# Patient Record
Sex: Female | Born: 1935 | Race: Black or African American | Hispanic: No | Smoking: Never smoker
Health system: Southern US, Community
[De-identification: ages and names within clinical notes are randomized; demographics above are authoritative.]

## PROBLEM LIST (undated history)

## (undated) DIAGNOSIS — I10 Essential (primary) hypertension: Secondary | ICD-10-CM

## (undated) DIAGNOSIS — E119 Type 2 diabetes mellitus without complications: Secondary | ICD-10-CM

## (undated) DIAGNOSIS — F0390 Unspecified dementia without behavioral disturbance: Secondary | ICD-10-CM

## (undated) DIAGNOSIS — E079 Disorder of thyroid, unspecified: Secondary | ICD-10-CM

## (undated) DIAGNOSIS — N189 Chronic kidney disease, unspecified: Secondary | ICD-10-CM

## (undated) DIAGNOSIS — E039 Hypothyroidism, unspecified: Secondary | ICD-10-CM

## (undated) HISTORY — PX: FRACTURE SURGERY: SHX138

## (undated) HISTORY — PX: ABDOMINAL HYSTERECTOMY: SHX81

---

## 2015-05-25 ENCOUNTER — Emergency Department (HOSPITAL_COMMUNITY)
Admission: EM | Admit: 2015-05-25 | Discharge: 2015-05-25 | Disposition: A | Discharge status: Home / Self Care | Payer: Medicare Other | Attending: Emergency Medicine | Admitting: Emergency Medicine

## 2015-05-25 ENCOUNTER — Emergency Department (HOSPITAL_COMMUNITY): Payer: Medicare Other

## 2015-05-25 ENCOUNTER — Encounter (HOSPITAL_COMMUNITY): Payer: Self-pay | Admitting: Emergency Medicine

## 2015-05-25 DIAGNOSIS — N390 Urinary tract infection, site not specified: Secondary | ICD-10-CM | POA: Diagnosis not present

## 2015-05-25 DIAGNOSIS — R0602 Shortness of breath: Secondary | ICD-10-CM | POA: Insufficient documentation

## 2015-05-25 DIAGNOSIS — I10 Essential (primary) hypertension: Secondary | ICD-10-CM | POA: Diagnosis not present

## 2015-05-25 DIAGNOSIS — R079 Chest pain, unspecified: Secondary | ICD-10-CM | POA: Diagnosis present

## 2015-05-25 DIAGNOSIS — E079 Disorder of thyroid, unspecified: Secondary | ICD-10-CM | POA: Diagnosis not present

## 2015-05-25 DIAGNOSIS — Z7984 Long term (current) use of oral hypoglycemic drugs: Secondary | ICD-10-CM | POA: Insufficient documentation

## 2015-05-25 DIAGNOSIS — Z79899 Other long term (current) drug therapy: Secondary | ICD-10-CM | POA: Insufficient documentation

## 2015-05-25 DIAGNOSIS — Z7901 Long term (current) use of anticoagulants: Secondary | ICD-10-CM | POA: Insufficient documentation

## 2015-05-25 DIAGNOSIS — E119 Type 2 diabetes mellitus without complications: Secondary | ICD-10-CM | POA: Diagnosis not present

## 2015-05-25 DIAGNOSIS — F0390 Unspecified dementia without behavioral disturbance: Secondary | ICD-10-CM | POA: Diagnosis not present

## 2015-05-25 HISTORY — DX: Disorder of thyroid, unspecified: E07.9

## 2015-05-25 HISTORY — DX: Essential (primary) hypertension: I10

## 2015-05-25 HISTORY — DX: Type 2 diabetes mellitus without complications: E11.9

## 2015-05-25 LAB — CBC
HCT: 36.7 % (ref 36.0–46.0)
Hemoglobin: 11.5 g/dL — ABNORMAL LOW (ref 12.0–15.0)
MCH: 28.3 pg (ref 26.0–34.0)
MCHC: 31.3 g/dL (ref 30.0–36.0)
MCV: 90.4 fL (ref 78.0–100.0)
PLATELETS: 223 10*3/uL (ref 150–400)
RBC: 4.06 MIL/uL (ref 3.87–5.11)
RDW: 14.8 % (ref 11.5–15.5)
WBC: 5.3 10*3/uL (ref 4.0–10.5)

## 2015-05-25 LAB — URINALYSIS, ROUTINE W REFLEX MICROSCOPIC
BILIRUBIN URINE: NEGATIVE
GLUCOSE, UA: NEGATIVE mg/dL
HGB URINE DIPSTICK: NEGATIVE
KETONES UR: NEGATIVE mg/dL
Nitrite: POSITIVE — AB
PH: 6.5 (ref 5.0–8.0)
Protein, ur: NEGATIVE mg/dL
Specific Gravity, Urine: 1.018 (ref 1.005–1.030)

## 2015-05-25 LAB — URINE MICROSCOPIC-ADD ON
RBC / HPF: NONE SEEN RBC/hpf (ref 0–5)
Squamous Epithelial / LPF: NONE SEEN

## 2015-05-25 LAB — BASIC METABOLIC PANEL
Anion gap: 11 (ref 5–15)
BUN: 27 mg/dL — ABNORMAL HIGH (ref 6–20)
CALCIUM: 9.2 mg/dL (ref 8.9–10.3)
CO2: 22 mmol/L (ref 22–32)
CREATININE: 1.31 mg/dL — AB (ref 0.44–1.00)
Chloride: 114 mmol/L — ABNORMAL HIGH (ref 101–111)
GFR calc Af Amer: 41 mL/min — ABNORMAL LOW (ref >60)
GFR, EST NON AFRICAN AMERICAN: 36 mL/min — AB (ref >60)
GLUCOSE: 157 mg/dL — AB (ref 65–99)
Potassium: 3.7 mmol/L (ref 3.5–5.1)
Sodium: 147 mmol/L — ABNORMAL HIGH (ref 135–145)

## 2015-05-25 LAB — I-STAT TROPONIN, ED: TROPONIN I, POC: 0.03 ng/mL (ref 0.00–0.08)

## 2015-05-25 MED ORDER — CEPHALEXIN 500 MG PO CAPS: 500.0000 mg | ORAL_CAPSULE | Freq: Three times a day (TID) | ORAL | Status: DC

## 2015-05-25 MED ORDER — STERILE WATER FOR INJECTION IJ SOLN
INTRAMUSCULAR | Status: AC
  Administered 2015-05-25: 2.1 mL
  Filled 2015-05-25: qty 10

## 2015-05-25 MED ORDER — CEFTRIAXONE SODIUM 1 G IJ SOLR
1.0000 g | Freq: Once | INTRAMUSCULAR | Status: AC
Start: 2015-05-25 — End: 2015-05-25
  Administered 2015-05-25: 1 g via INTRAMUSCULAR
  Filled 2015-05-25: qty 10

## 2015-05-25 NOTE — ED Notes (Signed)
Clydie BraunKaren, memory care manager accepts report for PT at this time. She is made aware of antibiotic dose in ED and new prescription

## 2015-05-25 NOTE — ED Provider Notes (Signed)
CSN: [id number]     Arrival date & time 05/25/15  1042 History   First MD Initiated Contact with Patient 05/25/15 1111     Chief Complaint  Patient presents with  . Chest Pain  . Shortness of Breath     (Consider location/radiation/quality/duration/timing/severity/associated sxs/prior Treatment) HPI  This is an 82 year old female with dementia who presents with chief complaint of "I just feel sick." Is a level V caveat due to dementia. The patient is unable to provide a good history. She states "I've just been feeling real bad for a long time." Unable to review systems. Patient denies chest pain or shortness of breath at this time. The patient denies any cough.  Past Medical History  Diagnosis Date  . Diabetes mellitus without complication (HCC)   . Hypertension   . Thyroid disease    Past Surgical History  Procedure Laterality Date  . Abdominal hysterectomy    . Fracture surgery     No family history on file. Social History  Substance Use Topics  . Smoking status: Never Smoker   . Smokeless tobacco: None  . Alcohol Use: No   OB History    No data available     Review of Systems  Unable to review systems  Allergies  Review of patient's allergies indicates no known allergies.  Home Medications   Prior to Admission medications   Medication Sig Start Date End Date Taking? Authorizing Provider  amLODipine (NORVASC) 10 MG tablet Take 10 mg by mouth daily. 05/13/15  Yes Historical Provider, MD  atorvastatin (LIPITOR) 20 MG tablet Take 20 mg by mouth daily. 04/26/15  Yes Historical Provider, MD  glipiZIDE (GLUCOTROL XL) 2.5 MG 24 hr tablet Take 2.5 mg by mouth daily. 04/18/15  Yes Historical Provider, MD  hydrochlorothiazide (HYDRODIURIL) 25 MG tablet Take 25 mg by mouth daily. 05/13/15  Yes Historical Provider, MD  levothyroxine (SYNTHROID, LEVOTHROID) 112 MCG tablet Take 112 mcg by mouth every other day. Alternate every other day with 125 mcg   Yes Historical Provider, MD   levothyroxine (SYNTHROID, LEVOTHROID) 125 MCG tablet Take 125 mcg by mouth every other day. Alternate every other day with 112 mcg   Yes Historical Provider, MD  lisinopril (PRINIVIL,ZESTRIL) 10 MG tablet Take 10 mg by mouth daily. 04/26/15  Yes Historical Provider, MD  LORazepam (ATIVAN) 0.5 MG tablet Take 0.5 mg by mouth 3 (three) times daily as needed. For anxiety sx 05/01/15  Yes Historical Provider, MD  LORazepam (ATIVAN) 0.5 MG tablet Take 0.5-1 mg by mouth 2 (two) times daily. Take 1 tab (0.5 mg) at 8 am and take 2 tabs (1 mg) at 8 pm.   Yes Historical Provider, MD  NAMZARIC 28-10 MG CP24 Take 1 capsule by mouth daily. 04/30/15  Yes Historical Provider, MD  omeprazole (PRILOSEC) 20 MG capsule Take 20 mg by mouth daily. 04/30/15  Yes Historical Provider, MD  QUEtiapine (SEROQUEL) 25 MG tablet Take 25 mg by mouth 2 (two) times daily. Take with 50 mg tab for 75 mg dose   Yes Historical Provider, MD  QUEtiapine (SEROQUEL) 50 MG tablet Take 50 mg by mouth 2 (two) times daily. Take with 25 mg tablet for 75 mg dose   Yes Historical Provider, MD  sertraline (ZOLOFT) 100 MG tablet Take 100 mg by mouth daily.   Yes Historical Provider, MD  traMADol (ULTRAM) 50 MG tablet Take 50 mg by mouth 3 (three) times daily as needed. 04/13/15  Yes Historical Provider, MD  vitamin B-12 (  CYANOCOBALAMIN) 1000 MCG tablet Take 1,000 mcg by mouth daily.   Yes Historical Provider, MD  Vitamins A & D (VITAMIN A & D) ointment Apply 1 application topically daily. Apply to sacral area every day   Yes Historical Provider, MD  XARELTO 20 MG TABS tablet Take 20 mg by mouth daily. 04/26/15  Yes Historical Provider, MD   BP 162/81 mmHg  Pulse 64  Temp(Src) 97.8 F (36.6 C) (Oral)  Resp 21  SpO2 98% Physical Exam  Constitutional: She is oriented to person, place, and time. She appears well-developed and well-nourished. No distress.  HENT:  Head: Normocephalic and atraumatic.  Eyes: Conjunctivae are normal. No scleral icterus.   Neck: Normal range of motion.  Cardiovascular: Normal rate, regular rhythm and normal heart sounds.  Exam reveals no gallop and no friction rub.   No murmur heard. Pulmonary/Chest: Effort normal and breath sounds normal. No respiratory distress.  Abdominal: Soft. Bowel sounds are normal. She exhibits no distension and no mass. There is no tenderness. There is no guarding.  Neurological: She is alert and oriented to person, place, and time.  Skin: Skin is warm and dry. She is not diaphoretic.    ED Course  Procedures (including critical care time) Labs Review Labs Reviewed  BASIC METABOLIC PANEL - Abnormal; Notable for the following:    Sodium 147 (*)    Chloride 114 (*)    Glucose, Bld 157 (*)    BUN 27 (*)    Creatinine, Ser 1.31 (*)    GFR calc non Af Amer 36 (*)    GFR calc Af Amer 41 (*)    All other components within normal limits  CBC - Abnormal; Notable for the following:    Hemoglobin 11.5 (*)    All other components within normal limits  I-STAT TROPOININ, ED    Imaging Review No results found. I have personally reviewed and evaluated these images and lab results as part of my medical decision-making.   EKG Interpretation   Date/Time:  Thursday May 25 2015 10:49:49 EDT Ventricular Rate:  63 PR Interval:  166 QRS Duration: 90 QT Interval:  424 QTC Calculation: 434 R Axis:   3 Text Interpretation:  Sinus rhythm LVH by voltage agree. no STEMI.no acute  ischemic appearance Confirmed by Donnald Garre, MD, Lebron Conners [phone]) on 05/25/2015  12:37:55 PM      MDM   Final diagnoses:  None    1:44 PM BP 170/77 mmHg  Pulse 63  Temp(Src) 97.8 F (36.6 C) (Oral)  Resp 19  SpO2 100% Patient with UA pos for UTI. Will treat wit IV rocephin.   Patient with UTI. Will treat with PO keflex at d/c  Afebrile, HDS. Appears safe for discharge.  Arthor Captain, PA-C 05/25/15 1629  Arby Barrette, MD 05/26/15 [phone]

## 2015-05-25 NOTE — ED Notes (Signed)
Called [facility] for pickup and transportation to [facility].

## 2015-05-25 NOTE — Discharge Instructions (Signed)

## 2015-05-25 NOTE — ED Notes (Signed)
[facility] comes [facility]. Patient has hx of ALZHEIMER . Per EMS patient complained of Chest Pain yesterday. Denies any on arrvial states she was also SOB. Patient breathing unlabored. Lungs sounds clear bilateral. Patient regular rate and rhythm. Patient does not appear to be in any distress during assessment, Patient 100% on RA

## 2015-05-25 NOTE — ED Notes (Signed)
Pt transported to xray 

## 2015-05-25 NOTE — ED Notes (Signed)
PT denies chest pain, SOB, and nausea. PT states, "I just feel bad. I'm sick" When asked to elaborate how she's feeling, PT repeats, "I just feel sick. I'm not hurting anywhere. I just don't feel good."

## 2015-05-25 NOTE — ED Notes (Signed)
Gave pt apple juice, per Claris Gowerharlotte - RN. Assisted pt with eating and drinking.

## 2015-05-30 ENCOUNTER — Emergency Department (HOSPITAL_COMMUNITY)
Admission: EM | Admit: 2015-05-30 | Discharge: 2015-05-30 | Disposition: A | Discharge status: Home / Self Care | Payer: Medicare Other | Attending: Emergency Medicine | Admitting: Emergency Medicine

## 2015-05-30 ENCOUNTER — Emergency Department (HOSPITAL_COMMUNITY): Payer: Medicare Other

## 2015-05-30 ENCOUNTER — Encounter (HOSPITAL_COMMUNITY): Payer: Self-pay

## 2015-05-30 DIAGNOSIS — W1839XA Other fall on same level, initial encounter: Secondary | ICD-10-CM | POA: Insufficient documentation

## 2015-05-30 DIAGNOSIS — S3992XA Unspecified injury of lower back, initial encounter: Secondary | ICD-10-CM | POA: Diagnosis present

## 2015-05-30 DIAGNOSIS — Z79899 Other long term (current) drug therapy: Secondary | ICD-10-CM | POA: Insufficient documentation

## 2015-05-30 DIAGNOSIS — E079 Disorder of thyroid, unspecified: Secondary | ICD-10-CM | POA: Diagnosis not present

## 2015-05-30 DIAGNOSIS — Y9389 Activity, other specified: Secondary | ICD-10-CM | POA: Diagnosis not present

## 2015-05-30 DIAGNOSIS — F0390 Unspecified dementia without behavioral disturbance: Secondary | ICD-10-CM | POA: Insufficient documentation

## 2015-05-30 DIAGNOSIS — W19XXXA Unspecified fall, initial encounter: Secondary | ICD-10-CM

## 2015-05-30 DIAGNOSIS — Y9289 Other specified places as the place of occurrence of the external cause: Secondary | ICD-10-CM | POA: Diagnosis not present

## 2015-05-30 DIAGNOSIS — E119 Type 2 diabetes mellitus without complications: Secondary | ICD-10-CM | POA: Diagnosis not present

## 2015-05-30 DIAGNOSIS — Y998 Other external cause status: Secondary | ICD-10-CM | POA: Insufficient documentation

## 2015-05-30 DIAGNOSIS — M549 Dorsalgia, unspecified: Secondary | ICD-10-CM

## 2015-05-30 DIAGNOSIS — Z7984 Long term (current) use of oral hypoglycemic drugs: Secondary | ICD-10-CM | POA: Diagnosis not present

## 2015-05-30 DIAGNOSIS — I10 Essential (primary) hypertension: Secondary | ICD-10-CM | POA: Diagnosis not present

## 2015-05-30 NOTE — ED Notes (Signed)
Pt agreeable to discharge, pt has dementia and [facility] has been called for transportation to [facility], report given to [facility]

## 2015-05-30 NOTE — ED Notes (Signed)
Called pt's dtr to tell her that pt is being discharged back [facility]. Called [facility] to give report pt is returning. [facility] called for transportation.

## 2015-05-30 NOTE — ED Provider Notes (Signed)
CSN: [id number]     Arrival date & time 05/30/15  4098 History   First MD Initiated Contact with Patient 05/30/15 [phone]     Chief Complaint  Patient presents with  . Fall     (Consider location/radiation/quality/duration/timing/severity/associated sxs/prior Treatment) HPI Comments: 82 year old female with past medical history including dementia, diabetes mellitus, hypertension who presents after a fall. History obtained from EMS. On EMS arrival to the patient's facility, she was laying on the floor and denied hitting her head but did not remember falling. According to nursing home staff, she is at her neurologic baseline. She was complaining of low back pain but currently denies any pain. She denies any complaints.  LEVEL 5 CAVEAT 2/2 DEMENTIA  Patient is a 82 y.o. female presenting with fall. The history is provided by the EMS personnel.  Fall    Past Medical History  Diagnosis Date  . Diabetes mellitus without complication (HCC)   . Hypertension   . Thyroid disease    Past Surgical History  Procedure Laterality Date  . Abdominal hysterectomy    . Fracture surgery     History reviewed. No pertinent family history. Social History  Substance Use Topics  . Smoking status: Never Smoker   . Smokeless tobacco: None  . Alcohol Use: No   OB History    No data available     Review of Systems  Unable to perform ROS: Dementia      Allergies  Review of patient's allergies indicates no known allergies.  Home Medications   Prior to Admission medications   Medication Sig Start Date End Date Taking? Authorizing Provider  amLODipine (NORVASC) 10 MG tablet Take 10 mg by mouth daily. 05/13/15   Historical Provider, MD  atorvastatin (LIPITOR) 20 MG tablet Take 20 mg by mouth daily. 04/26/15   Historical Provider, MD  cephALEXin (KEFLEX) 500 MG capsule Take 1 capsule (500 mg total) by mouth 3 (three) times daily. 05/25/15   Arthor Captain, PA-C  glipiZIDE (GLUCOTROL XL) 2.5 MG 24 hr  tablet Take 2.5 mg by mouth daily. 04/18/15   Historical Provider, MD  hydrochlorothiazide (HYDRODIURIL) 25 MG tablet Take 25 mg by mouth daily. 05/13/15   Historical Provider, MD  levothyroxine (SYNTHROID, LEVOTHROID) 112 MCG tablet Take 112 mcg by mouth every other day. Alternate every other day with 125 mcg    Historical Provider, MD  levothyroxine (SYNTHROID, LEVOTHROID) 125 MCG tablet Take 125 mcg by mouth every other day. Alternate every other day with 112 mcg    Historical Provider, MD  lisinopril (PRINIVIL,ZESTRIL) 10 MG tablet Take 10 mg by mouth daily. 04/26/15   Historical Provider, MD  LORazepam (ATIVAN) 0.5 MG tablet Take 0.5 mg by mouth 3 (three) times daily as needed. For anxiety sx 05/01/15   Historical Provider, MD  LORazepam (ATIVAN) 0.5 MG tablet Take 0.5-1 mg by mouth 2 (two) times daily. Take 1 tab (0.5 mg) at 8 am and take 2 tabs (1 mg) at 8 pm.    Historical Provider, MD  NAMZARIC 28-10 MG CP24 Take 1 capsule by mouth daily. 04/30/15   Historical Provider, MD  omeprazole (PRILOSEC) 20 MG capsule Take 20 mg by mouth daily. 04/30/15   Historical Provider, MD  QUEtiapine (SEROQUEL) 25 MG tablet Take 25 mg by mouth 2 (two) times daily. Take with 50 mg tab for 75 mg dose    Historical Provider, MD  QUEtiapine (SEROQUEL) 50 MG tablet Take 50 mg by mouth 2 (two) times daily. Take with 25  mg tablet for 75 mg dose    Historical Provider, MD  sertraline (ZOLOFT) 100 MG tablet Take 100 mg by mouth daily.    Historical Provider, MD  traMADol (ULTRAM) 50 MG tablet Take 50 mg by mouth 3 (three) times daily as needed. 04/13/15   Historical Provider, MD  vitamin B-12 (CYANOCOBALAMIN) 1000 MCG tablet Take 1,000 mcg by mouth daily.    Historical Provider, MD  Vitamins A & D (VITAMIN A & D) ointment Apply 1 application topically daily. Apply to sacral area every day    Historical Provider, MD  XARELTO 20 MG TABS tablet Take 20 mg by mouth daily. 04/26/15   Historical Provider, MD   BP 168/63 mmHg  Pulse  60  Temp(Src) 97.3 F (36.3 C) (Oral)  Ht  (1.727 m)  SpO2 96% Physical Exam  Constitutional: She appears well-developed and well-nourished. No distress.  Awake, alert, thin and frail appearing but comfortable  HENT:  Head: Normocephalic and atraumatic.  Moist mucous membranes, multiple missing teeth  Eyes: Conjunctivae are normal. Pupils are equal, round, and reactive to light.  Neck: Normal range of motion. Neck supple.  Cardiovascular: Normal rate, regular rhythm and normal heart sounds.   No murmur heard. Pulmonary/Chest: Effort normal and breath sounds normal. She exhibits no tenderness.  Abdominal: Soft. Bowel sounds are normal. She exhibits no distension. There is no tenderness.  Musculoskeletal: She exhibits no edema or tenderness.  No midline spinal TTP  Neurological: She is alert.  Oriented to person, intermittently able to follow basic commands, moving all 4 extremities  Skin: Skin is warm and dry.  No ecchymoses or abrasions  Nursing note and vitals reviewed.   ED Course  Procedures (including critical care time) Labs Review Labs Reviewed - No data to display  Imaging Review Dg Chest 2 View  05/30/2015  CLINICAL DATA:  Recent fall EXAM: CHEST  2 VIEW COMPARISON:  05/25/2015 FINDINGS: The heart size and mediastinal contours are within normal limits. Both lungs are clear. The visualized skeletal structures show chronic compression deformities in the lower thoracic spine. IMPRESSION: No active cardiopulmonary disease. Electronically Signed   By: Alcide Clever M.D.   On: 05/30/2015 09:50   Dg Lumbar Spine 2-3 Views  05/30/2015  CLINICAL DATA:  Fall today, history of dementia EXAM: LUMBAR SPINE - 2-3 VIEW COMPARISON:  05/25/2015 FINDINGS: Two views of lumbar spine submitted. Alignment is preserved. There is diffuse osteopenia. Again noted significant compression deformity T12 vertebral body of indeterminate age. There is disc space flattening with mild anterior spurring  at L1-L2 level. Minimal disc space flattening with mild anterior spurring at L2-L3 level. Facet degenerative changes are noted L4 and L5 level. IMPRESSION: Again noted significant compression deformity T12 vertebral body of indeterminate age. Diffuse osteopenia. Degenerative changes as described above. Electronically Signed   By: Natasha Mead M.D.   On: 05/30/2015 09:56   Ct Head Wo Contrast  05/30/2015  CLINICAL DATA:  Unwitnessed fall, low back pain, hit her head EXAM: CT HEAD WITHOUT CONTRAST CT CERVICAL SPINE WITHOUT CONTRAST TECHNIQUE: Multidetector CT imaging of the head and cervical spine was performed following the standard protocol without intravenous contrast. Multiplanar CT image reconstructions of the cervical spine were also generated. COMPARISON:  None. FINDINGS: CT HEAD FINDINGS No skull fracture is noted. Paranasal sinuses and mastoid air cells are unremarkable. Moderate cerebral atrophy. Extensive periventricular and patchy subcortical white matter decreased attenuation probable due to chronic small vessel ischemic changes. Paranasal sinuses and mastoid air  cells are unremarkable. Mild atherosclerotic calcifications of carotid siphon. No definite acute cortical infarction. No mass lesion is noted on this unenhanced scan. CT CERVICAL SPINE FINDINGS Axial images of the cervical spine shows no acute fracture or subluxation. Degenerative changes are noted C1-C2 articulation. Computer processed images shows no acute fracture or subluxation. There is moderate disc space flattening with mild anterior spurring at C2-C3 level. There is disc space flattening with mild anterior and mild posterior spurring at C3-C4 level. Partial bony fusion at C3-C4 level. Mild disc space flattening at C4-C5 level with mild anterior spurring. Mild to moderate disc space flattening with mild anterior and minimal posterior spurring at C5-C6 level. There is partial bony fusion and mild anterior spurring at C6-C7 level. No  prevertebral soft tissue swelling. Cervical airway is patent. Multilevel facet degenerative changes are noted. Extensive atherosclerotic calcifications are noted bilateral carotid bifurcation. There is no pneumothorax in visualized lung apices. IMPRESSION: 1. No acute intracranial abnormality. Moderate cerebral atrophy. Extensive periventricular and patchy subcortical white matter decreased attenuation probable due to chronic small vessel ischemic changes. 2. No cervical spine acute fracture or subluxation. Multilevel degenerative changes as described above. 3. No prevertebral soft tissue swelling. 4. Extensive atherosclerotic calcifications bilateral carotid bifurcation. Electronically Signed   By: Natasha MeadLiviu  Pop M.D.   On: 05/30/2015 09:42   Ct Cervical Spine Wo Contrast  05/30/2015  CLINICAL DATA:  Unwitnessed fall, low back pain, hit her head EXAM: CT HEAD WITHOUT CONTRAST CT CERVICAL SPINE WITHOUT CONTRAST TECHNIQUE: Multidetector CT imaging of the head and cervical spine was performed following the standard protocol without intravenous contrast. Multiplanar CT image reconstructions of the cervical spine were also generated. COMPARISON:  None. FINDINGS: CT HEAD FINDINGS No skull fracture is noted. Paranasal sinuses and mastoid air cells are unremarkable. Moderate cerebral atrophy. Extensive periventricular and patchy subcortical white matter decreased attenuation probable due to chronic small vessel ischemic changes. Paranasal sinuses and mastoid air cells are unremarkable. Mild atherosclerotic calcifications of carotid siphon. No definite acute cortical infarction. No mass lesion is noted on this unenhanced scan. CT CERVICAL SPINE FINDINGS Axial images of the cervical spine shows no acute fracture or subluxation. Degenerative changes are noted C1-C2 articulation. Computer processed images shows no acute fracture or subluxation. There is moderate disc space flattening with mild anterior spurring at C2-C3 level.  There is disc space flattening with mild anterior and mild posterior spurring at C3-C4 level. Partial bony fusion at C3-C4 level. Mild disc space flattening at C4-C5 level with mild anterior spurring. Mild to moderate disc space flattening with mild anterior and minimal posterior spurring at C5-C6 level. There is partial bony fusion and mild anterior spurring at C6-C7 level. No prevertebral soft tissue swelling. Cervical airway is patent. Multilevel facet degenerative changes are noted. Extensive atherosclerotic calcifications are noted bilateral carotid bifurcation. There is no pneumothorax in visualized lung apices. IMPRESSION: 1. No acute intracranial abnormality. Moderate cerebral atrophy. Extensive periventricular and patchy subcortical white matter decreased attenuation probable due to chronic small vessel ischemic changes. 2. No cervical spine acute fracture or subluxation. Multilevel degenerative changes as described above. 3. No prevertebral soft tissue swelling. 4. Extensive atherosclerotic calcifications bilateral carotid bifurcation. Electronically Signed   By: Natasha MeadLiviu  Pop M.D.   On: 05/30/2015 09:42    EKG Interpretation None      MDM   Final diagnoses:  Back pain  Fall, initial encounter   PT w/ dementia presents from nursing facility after suspected fall. She was comfortable on exam, vital signs  notable for hypertension. No evidence of trauma and no extremity or back tenderness. Obtained CT of head and C-spine as well as x-ray of lower back and chest. Imaging negative for acute injury, old T12 compression deformity noted. As patient is well-appearing without obvious signs of trauma, I feel that she is safe for discharge home.    Laurence Spates, MD 05/30/15 1020

## 2015-05-30 NOTE — ED Notes (Signed)
Pt presents to er via ems with complaints of low back pain from a possible fall, on ems arrival the patient was laying in the floor and denies hitting her head, she does not remember falling and has a history of dementia she is at her baseline per nursing home staff, pt states she is no longer having any back pain

## 2015-05-30 NOTE — ED Notes (Signed)
Notified [facility] for transportation back home 

## 2015-06-26 ENCOUNTER — Emergency Department (HOSPITAL_COMMUNITY): Payer: Medicare Other

## 2015-06-26 ENCOUNTER — Encounter (HOSPITAL_COMMUNITY): Payer: Self-pay | Admitting: *Deleted

## 2015-06-26 ENCOUNTER — Emergency Department (HOSPITAL_COMMUNITY)
Admission: EM | Admit: 2015-06-26 | Discharge: 2015-06-26 | Disposition: A | Discharge status: Home / Self Care | Payer: Medicare Other | Attending: Emergency Medicine | Admitting: Emergency Medicine

## 2015-06-26 DIAGNOSIS — Y9389 Activity, other specified: Secondary | ICD-10-CM | POA: Insufficient documentation

## 2015-06-26 DIAGNOSIS — Z7901 Long term (current) use of anticoagulants: Secondary | ICD-10-CM | POA: Insufficient documentation

## 2015-06-26 DIAGNOSIS — E1165 Type 2 diabetes mellitus with hyperglycemia: Secondary | ICD-10-CM | POA: Insufficient documentation

## 2015-06-26 DIAGNOSIS — I10 Essential (primary) hypertension: Secondary | ICD-10-CM | POA: Diagnosis not present

## 2015-06-26 DIAGNOSIS — Z79899 Other long term (current) drug therapy: Secondary | ICD-10-CM | POA: Diagnosis not present

## 2015-06-26 DIAGNOSIS — F0390 Unspecified dementia without behavioral disturbance: Secondary | ICD-10-CM | POA: Insufficient documentation

## 2015-06-26 DIAGNOSIS — Y9289 Other specified places as the place of occurrence of the external cause: Secondary | ICD-10-CM | POA: Insufficient documentation

## 2015-06-26 DIAGNOSIS — Y999 Unspecified external cause status: Secondary | ICD-10-CM | POA: Insufficient documentation

## 2015-06-26 DIAGNOSIS — E079 Disorder of thyroid, unspecified: Secondary | ICD-10-CM | POA: Diagnosis not present

## 2015-06-26 DIAGNOSIS — W050XXA Fall from non-moving wheelchair, initial encounter: Secondary | ICD-10-CM | POA: Insufficient documentation

## 2015-06-26 DIAGNOSIS — Z792 Long term (current) use of antibiotics: Secondary | ICD-10-CM | POA: Diagnosis not present

## 2015-06-26 DIAGNOSIS — R739 Hyperglycemia, unspecified: Secondary | ICD-10-CM

## 2015-06-26 DIAGNOSIS — F419 Anxiety disorder, unspecified: Secondary | ICD-10-CM | POA: Diagnosis not present

## 2015-06-26 DIAGNOSIS — Z043 Encounter for examination and observation following other accident: Secondary | ICD-10-CM | POA: Insufficient documentation

## 2015-06-26 LAB — CBC WITH DIFFERENTIAL/PLATELET
BASOS ABS: 0 10*3/uL (ref 0.0–0.1)
BASOS PCT: 0 %
Eosinophils Absolute: 0 10*3/uL (ref 0.0–0.7)
Eosinophils Relative: 1 %
HEMATOCRIT: 39.4 % (ref 36.0–46.0)
HEMOGLOBIN: 12.4 g/dL (ref 12.0–15.0)
Lymphocytes Relative: 17 %
Lymphs Abs: 0.8 10*3/uL (ref 0.7–4.0)
MCH: 27.8 pg (ref 26.0–34.0)
MCHC: 31.5 g/dL (ref 30.0–36.0)
MCV: 88.3 fL (ref 78.0–100.0)
MONOS PCT: 6 %
Monocytes Absolute: 0.3 10*3/uL (ref 0.1–1.0)
NEUTROS ABS: 3.6 10*3/uL (ref 1.7–7.7)
NEUTROS PCT: 76 %
Platelets: 169 10*3/uL (ref 150–400)
RBC: 4.46 MIL/uL (ref 3.87–5.11)
RDW: 13.9 % (ref 11.5–15.5)
WBC: 4.8 10*3/uL (ref 4.0–10.5)

## 2015-06-26 LAB — CBG MONITORING, ED
Glucose-Capillary: 554 mg/dL (ref 65–99)
Glucose-Capillary: 441 mg/dL — ABNORMAL HIGH (ref 65–99)

## 2015-06-26 LAB — URINE MICROSCOPIC-ADD ON

## 2015-06-26 LAB — URINALYSIS, ROUTINE W REFLEX MICROSCOPIC
Bilirubin Urine: NEGATIVE
Glucose, UA: >1000 mg/dL — AB
Hgb urine dipstick: NEGATIVE
Ketones, ur: NEGATIVE mg/dL
LEUKOCYTES UA: NEGATIVE
NITRITE: POSITIVE — AB
PH: 6 (ref 5.0–8.0)
Protein, ur: NEGATIVE mg/dL
SPECIFIC GRAVITY, URINE: 1.024 (ref 1.005–1.030)

## 2015-06-26 LAB — BASIC METABOLIC PANEL
ANION GAP: 11 (ref 5–15)
BUN: 41 mg/dL — ABNORMAL HIGH (ref 6–20)
CHLORIDE: 106 mmol/L (ref 101–111)
CO2: 22 mmol/L (ref 22–32)
Calcium: 9.5 mg/dL (ref 8.9–10.3)
Creatinine, Ser: 1.88 mg/dL — ABNORMAL HIGH (ref 0.44–1.00)
GFR calc non Af Amer: 23 mL/min — ABNORMAL LOW (ref >60)
GFR, EST AFRICAN AMERICAN: 27 mL/min — AB (ref >60)
Glucose, Bld: 754 mg/dL (ref 65–99)
POTASSIUM: 4.8 mmol/L (ref 3.5–5.1)
Sodium: 139 mmol/L (ref 135–145)

## 2015-06-26 MED ORDER — SODIUM CHLORIDE 0.9 % IV BOLUS (SEPSIS)
1000.0000 mL | Freq: Once | INTRAVENOUS | Status: AC
  Administered 2015-06-26: 1000 mL via INTRAVENOUS

## 2015-06-26 MED ORDER — LINAGLIPTIN 5 MG PO TABS
5.0000 mg | ORAL_TABLET | Freq: Every day | ORAL | Status: DC
  Administered 2015-06-26: 5 mg via ORAL
  Filled 2015-06-26: qty 1

## 2015-06-26 MED ORDER — INSULIN ASPART 100 UNIT/ML ~~LOC~~ SOLN
12.0000 [IU] | Freq: Once | SUBCUTANEOUS | Status: AC
  Administered 2015-06-26: 12 [IU] via SUBCUTANEOUS
  Filled 2015-06-26: qty 1

## 2015-06-26 MED ORDER — INSULIN ASPART 100 UNIT/ML ~~LOC~~ SOLN: 10.0000 [IU] | Freq: Once | SUBCUTANEOUS | Status: DC

## 2015-06-26 MED ORDER — LINAGLIPTIN 5 MG PO TABS: 5.0000 mg | ORAL_TABLET | Freq: Every day | ORAL | Status: DC

## 2015-06-26 MED ORDER — SODIUM CHLORIDE 0.9 % IV BOLUS (SEPSIS): 1000.0000 mL | Freq: Once | INTRAVENOUS | Status: DC

## 2015-06-26 NOTE — ED Notes (Signed)
Cynthia FearingJames, MD requesting [facility] be contacted to clarify when the pt stopped having her CBGs checked & if the pt is taking meds for her DM, this Rn spoke with a EMT at [facility] that provided the nurses number [phone], this RN spoke with Chandra BatchKaren Johnson, RN who states that the pt does not take insulin for DM, reports the pt takes oral DM meds, this RN informed her that there is not a DM med [facility], the RN was not able to report the oral DM med at this time, Laural BenesJohnson, RN to return call

## 2015-06-26 NOTE — ED Notes (Signed)
This Rn spoke with Chandra BatchKaren Johnson who reports that the pt takes Glipizide 2.5 mg daily, last received 06/25/15, this RN provided Fayrene FearingJames, MD contact info & Laural BenesJohnson, RN agrees to provide this number to the provider at the facility, Fayrene FearingJames MD updated

## 2015-06-26 NOTE — ED Notes (Signed)
[facility], per report staff heard the pt fall from her wheelchair, pt found in the floor, -LOC, pt takes Ellin GoodieXerelto, SNF contacted family who declined the pt being seen for the fall, pt found by EMS to have CBG 590 & was sent here for eval, pt baseline neuro alert to verbal, pt hx of dementia, recent tx for UTI, arrives to ED with foul smelling urine

## 2015-06-26 NOTE — ED Provider Notes (Signed)
CSN: [id number]     Arrival date & time 06/26/15  1014 History   First MD Initiated Contact with Patient 06/26/15 1015     Chief Complaint  Patient presents with  . Hyperglycemia  . Fall      HPI  Patient presents violation after a fall at her care facility. She was apparently leaning forward in her wheelchair and fell forward. May or may not have struck her head. Was able to stand and sit back in the wheelchair with help. Apparently does have history of diabetes and is simply on glipizide and has not been getting blood sugar checks at the care facility. His never been as dependent. No other complaints. On arrival states she feels "fine".  Past Medical History  Diagnosis Date  . Diabetes mellitus without complication (HCC)   . Hypertension   . Thyroid disease    Past Surgical History  Procedure Laterality Date  . Abdominal hysterectomy    . Fracture surgery     No family history on file. Social History  Substance Use Topics  . Smoking status: Never Smoker   . Smokeless tobacco: None  . Alcohol Use: No   OB History    No data available     Review of Systems  Unable to perform ROS: Dementia      Allergies  Review of patient's allergies indicates no known allergies.  Home Medications   Prior to Admission medications   Medication Sig Start Date End Date Taking? Authorizing Provider  amLODipine (NORVASC) 10 MG tablet Take 10 mg by mouth daily. 05/13/15   Historical Provider, MD  atorvastatin (LIPITOR) 20 MG tablet Take 20 mg by mouth daily. 04/26/15   Historical Provider, MD  cephALEXin (KEFLEX) 500 MG capsule Take 1 capsule (500 mg total) by mouth 3 (three) times daily. 05/25/15   Arthor CaptainAbigail Harris, PA-C  glipiZIDE (GLUCOTROL XL) 2.5 MG 24 hr tablet Take 2.5 mg by mouth daily. 04/18/15   Historical Provider, MD  hydrochlorothiazide (HYDRODIURIL) 25 MG tablet Take 25 mg by mouth daily. 05/13/15   Historical Provider, MD  levothyroxine (SYNTHROID, LEVOTHROID) 112 MCG tablet Take  112 mcg by mouth every other day. Alternate every other day with 125 mcg    Historical Provider, MD  levothyroxine (SYNTHROID, LEVOTHROID) 125 MCG tablet Take 125 mcg by mouth every other day. Alternate every other day with 112 mcg    Historical Provider, MD  linagliptin (TRADJENTA) 5 MG TABS tablet Take 1 tablet (5 mg total) by mouth daily. 06/26/15   Rolland PorterMark Decklin Weddington, MD  lisinopril (PRINIVIL,ZESTRIL) 10 MG tablet Take 10 mg by mouth daily. 04/26/15   Historical Provider, MD  LORazepam (ATIVAN) 0.5 MG tablet Take 0.5 mg by mouth 3 (three) times daily as needed. For anxiety sx 05/01/15   Historical Provider, MD  LORazepam (ATIVAN) 0.5 MG tablet Take 0.5-1 mg by mouth 2 (two) times daily. Take 1 tab (0.5 mg) at 8 am and take 2 tabs (1 mg) at 8 pm.    Historical Provider, MD  NAMZARIC 28-10 MG CP24 Take 1 capsule by mouth daily. 04/30/15   Historical Provider, MD  omeprazole (PRILOSEC) 20 MG capsule Take 20 mg by mouth daily. 04/30/15   Historical Provider, MD  QUEtiapine (SEROQUEL) 25 MG tablet Take 25 mg by mouth 2 (two) times daily. Take with 50 mg tab for 75 mg dose    Historical Provider, MD  QUEtiapine (SEROQUEL) 50 MG tablet Take 50 mg by mouth 2 (two) times daily. Take with  25 mg tablet for 75 mg dose    Historical Provider, MD  sertraline (ZOLOFT) 100 MG tablet Take 100 mg by mouth daily.    Historical Provider, MD  traMADol (ULTRAM) 50 MG tablet Take 50 mg by mouth 3 (three) times daily as needed. 04/13/15   Historical Provider, MD  vitamin B-12 (CYANOCOBALAMIN) 1000 MCG tablet Take 1,000 mcg by mouth daily.    Historical Provider, MD  Vitamins A & D (VITAMIN A & D) ointment Apply 1 application topically daily. Apply to sacral area every day    Historical Provider, MD  XARELTO 20 MG TABS tablet Take 20 mg by mouth daily. 04/26/15   Historical Provider, MD   BP 149/69 mmHg  Pulse 67  Temp(Src) 97.4 F (36.3 C) (Oral)  Resp 12  SpO2 100% Physical Exam  Constitutional: She is oriented to person,  place, and time. She appears well-developed and well-nourished. No distress.  Awake alert. Elderly. Able to answer simple yes and no questions only.  HENT:  Head: Normocephalic.  No sinus tract in the head. No areas of contusion or tenderness. No collections of blood over the TMs, mastoids, or from ears nose or mouth.  Eyes: Conjunctivae are normal. Pupils are equal, round, and reactive to light. No scleral icterus.  Neck: Normal range of motion. Neck supple. No thyromegaly present.  Cardiovascular: Normal rate and regular rhythm.  Exam reveals no gallop and no friction rub.   No murmur heard. Pulmonary/Chest: Effort normal and breath sounds normal. No respiratory distress. She has no wheezes. She has no rales.  Abdominal: Soft. Bowel sounds are normal. She exhibits no distension. There is no tenderness. There is no rebound.  Musculoskeletal: Normal range of motion.  Neurological: She is alert and oriented to person, place, and time.  Skin: Skin is warm and dry. No rash noted.  Psychiatric: She has a normal mood and affect. Her behavior is normal.    ED Course  Procedures (including critical care time) Labs Review Labs Reviewed  BASIC METABOLIC PANEL - Abnormal; Notable for the following:    Glucose, Bld 754 (*)    BUN 41 (*)    Creatinine, Ser 1.88 (*)    GFR calc non Af Amer 23 (*)    GFR calc Af Amer 27 (*)    All other components within normal limits  URINALYSIS, ROUTINE W REFLEX MICROSCOPIC (NOT AT [facility]) - Abnormal; Notable for the following:    Glucose, UA >1000 (*)    Nitrite POSITIVE (*)    All other components within normal limits  URINE MICROSCOPIC-ADD ON - Abnormal; Notable for the following:    Squamous Epithelial / LPF 0-5 (*)    Bacteria, UA FEW (*)    All other components within normal limits  CBG MONITORING, ED - Abnormal; Notable for the following:    Glucose-Capillary 554 (*)    All other components within normal limits  CBG MONITORING, ED - Abnormal; Notable  for the following:    Glucose-Capillary 441 (*)    All other components within normal limits  URINE CULTURE  CBC WITH DIFFERENTIAL/PLATELET    Imaging Review Ct Head Wo Contrast  06/26/2015  CLINICAL DATA:  Fall from wheel while on Xarelto.  History dementia. EXAM: CT HEAD WITHOUT CONTRAST CT CERVICAL SPINE WITHOUT CONTRAST TECHNIQUE: Multidetector CT imaging of the head and cervical spine was performed following the standard protocol without intravenous contrast. Multiplanar CT image reconstructions of the cervical spine were also generated. COMPARISON:  Head  and C-spine CT -05/30/2015 FINDINGS: CT HEAD FINDINGS Similar findings of advanced atrophy with sulcal prominence and centralized volume loss with commensurate ex vacuo dilatation of the ventricular system. Extensive, confluent periventricular hypodensities compatible with microvascular ischemic disease. Right basal ganglial calcifications. Given extensive background parenchymal abnormalities, there is no CT evidence of superimposed acute large territory infarct. No intraparenchymal or extra-axial mass or hemorrhage. Unchanged size and configuration of the ventricles and basilar cisterns. No midline shift. Limited visualization the paranasal sinuses and mastoid air cells is normal. No air-fluid levels. Regional soft tissues appear normal. No displaced calvarial fracture. CT CERVICAL SPINE FINDINGS C1 to the superior endplate of T3 is imaged. Normal alignment of the cervical spine. No anterolisthesis or retrolisthesis. The bilateral facets are normally aligned. The dens is normally positioned between the lateral masses of C1. Mild atlantodental degenerative change. Normal atlantoaxial articulations. No fracture or static subluxation of the cervical spine. Cervical vertebral body heights are preserved. Prevertebral soft tissues are normal. There is partial ankylosis of the C3-C4 and C6-C7 intervertebral disc spaces. Mild multilevel cervical spine DDD  within the remainder of the cervical spine, likely worse at C5-C6 with disc space height loss, endplate irregularity and sclerosis. An approximately 0.5 cm ossicle is again noted about the left side of the C3 spinous process without discrete donor site identified. Re-demonstrated partial ankylosis of multiple bilateral transverse process. Atherosclerotic plaque within the bilateral carotid bulbs. No bulky cervical lymphadenopathy on this noncontrast examination. Limited visualization of lung apices is normal. Severe degenerative change of the bilateral glenohumeral joints, incompletely evaluated. IMPRESSION: 1. Similar findings of advanced atrophy and microvascular ischemic disease without acute intracranial process. 2. No fracture or static subluxation of the cervical spine. 3. Mild multilevel cervical spine DDD and intervertebral disc space ankylosis as detailed above. Electronically Signed   By: Simonne Come M.D.   On: 06/26/2015 11:48   Ct Cervical Spine Wo Contrast  06/26/2015  CLINICAL DATA:  Fall from wheel while on Xarelto.  History dementia. EXAM: CT HEAD WITHOUT CONTRAST CT CERVICAL SPINE WITHOUT CONTRAST TECHNIQUE: Multidetector CT imaging of the head and cervical spine was performed following the standard protocol without intravenous contrast. Multiplanar CT image reconstructions of the cervical spine were also generated. COMPARISON:  Head and C-spine CT -05/30/2015 FINDINGS: CT HEAD FINDINGS Similar findings of advanced atrophy with sulcal prominence and centralized volume loss with commensurate ex vacuo dilatation of the ventricular system. Extensive, confluent periventricular hypodensities compatible with microvascular ischemic disease. Right basal ganglial calcifications. Given extensive background parenchymal abnormalities, there is no CT evidence of superimposed acute large territory infarct. No intraparenchymal or extra-axial mass or hemorrhage. Unchanged size and configuration of the ventricles  and basilar cisterns. No midline shift. Limited visualization the paranasal sinuses and mastoid air cells is normal. No air-fluid levels. Regional soft tissues appear normal. No displaced calvarial fracture. CT CERVICAL SPINE FINDINGS C1 to the superior endplate of T3 is imaged. Normal alignment of the cervical spine. No anterolisthesis or retrolisthesis. The bilateral facets are normally aligned. The dens is normally positioned between the lateral masses of C1. Mild atlantodental degenerative change. Normal atlantoaxial articulations. No fracture or static subluxation of the cervical spine. Cervical vertebral body heights are preserved. Prevertebral soft tissues are normal. There is partial ankylosis of the C3-C4 and C6-C7 intervertebral disc spaces. Mild multilevel cervical spine DDD within the remainder of the cervical spine, likely worse at C5-C6 with disc space height loss, endplate irregularity and sclerosis. An approximately 0.5 cm ossicle is again noted  about the left side of the C3 spinous process without discrete donor site identified. Re-demonstrated partial ankylosis of multiple bilateral transverse process. Atherosclerotic plaque within the bilateral carotid bulbs. No bulky cervical lymphadenopathy on this noncontrast examination. Limited visualization of lung apices is normal. Severe degenerative change of the bilateral glenohumeral joints, incompletely evaluated. IMPRESSION: 1. Similar findings of advanced atrophy and microvascular ischemic disease without acute intracranial process. 2. No fracture or static subluxation of the cervical spine. 3. Mild multilevel cervical spine DDD and intervertebral disc space ankylosis as detailed above. Electronically Signed   By: Simonne Come M.D.   On: 06/26/2015 11:48   I have personally reviewed and evaluated these images and lab results as part of my medical decision-making.   EKG Interpretation None      MDM   Final diagnoses:  Hyperglycemia     Patient with hyperglycemia. Improves from over 700-400 with fluids and insulin. Remains asymptomatic. Crit and 1.88 versus a baseline of 1.5. I discussed the case with the in PE at [facility]. Has not been getting blood sugar checks for the last 3 weeks because she had been averaging 1:30 to 160 for "quite some time". She recommends addingTradgenta, 5 mg daily to her current regimen of glipizide 2.5 daily. An restarting 3 times a day blood sugar checks. Anxious appropriate. I do not find indication for admission or further diagnostics on the patient this point.    Rolland Porter, MD 06/26/15 [phone]

## 2015-06-26 NOTE — Discharge Instructions (Signed)
Instructions for facility: Blood glucose checks TID. Continue glipizide 2.5mg  once per day. NEW RX:  Tradgenta,  once per day. First dose in ER 4/17. Next dose am 4/18   Hyperglycemia Hyperglycemia occurs when the glucose (sugar) in your blood is too high. Hyperglycemia can happen for many reasons, but it most often happens to people who do not know they have diabetes or are not managing their diabetes properly.  CAUSES  Whether you have diabetes or not, there are other causes of hyperglycemia. Hyperglycemia can occur when you have diabetes, but it can also occur in other situations that you might not be as aware of, such as: Diabetes  If you have diabetes and are having problems controlling your blood glucose, hyperglycemia could occur because of some of the following reasons:  Not following your meal plan.  Not taking your diabetes medications or not taking it properly.  Exercising less or doing less activity than you normally do.  Being sick. Pre-diabetes  This cannot be ignored. Before people develop Type 2 diabetes, they almost always have "pre-diabetes." This is when your blood glucose levels are higher than normal, but not yet high enough to be diagnosed as diabetes. Research has shown that some long-term damage to the body, especially the heart and circulatory system, may already be occurring during pre-diabetes. If you take action to manage your blood glucose when you have pre-diabetes, you may delay or prevent Type 2 diabetes from developing. Stress  If you have diabetes, you may be "diet" controlled or on oral medications or insulin to control your diabetes. However, you may find that your blood glucose is higher than usual in the hospital whether you have diabetes or not. This is often referred to as "stress hyperglycemia." Stress can elevate your blood glucose. This happens because of hormones put out by the body during times of stress. If stress has been the cause of your  high blood glucose, it can be followed regularly by your caregiver. That way he/she can make sure your hyperglycemia does not continue to get worse or progress to diabetes. Steroids  Steroids are medications that act on the infection fighting system (immune system) to block inflammation or infection. One side effect can be a rise in blood glucose. Most people can produce enough extra insulin to allow for this rise, but for those who cannot, steroids make blood glucose levels go even higher. It is not unusual for steroid treatments to "uncover" diabetes that is developing. It is not always possible to determine if the hyperglycemia will go away after the steroids are stopped. A special blood test called an A1c is sometimes done to determine if your blood glucose was elevated before the steroids were started. SYMPTOMS  Thirsty.  Frequent urination.  Dry mouth.  Blurred vision.  Tired or fatigue.  Weakness.  Sleepy.  Tingling in feet or leg. DIAGNOSIS  Diagnosis is made by monitoring blood glucose in one or all of the following ways:  A1c test. This is a chemical found in your blood.  Fingerstick blood glucose monitoring.  Laboratory results. TREATMENT  First, knowing the cause of the hyperglycemia is important before the hyperglycemia can be treated. Treatment may include, but is not be limited to:  Education.  Change or adjustment in medications.  Change or adjustment in meal plan.  Treatment for an illness, infection, etc.  More frequent blood glucose monitoring.  Change in exercise plan.  Decreasing or stopping steroids.  Lifestyle changes. HOME CARE INSTRUCTIONS  Test your blood glucose as directed.  Exercise regularly. Your caregiver will give you instructions about exercise. Pre-diabetes or diabetes which comes on with stress is helped by exercising.  Eat wholesome, balanced meals. Eat often and at regular, fixed times. Your caregiver or nutritionist will  give you a meal plan to guide your sugar intake.  Being at an ideal weight is important. If needed, losing as little as 10 to 15 pounds may help improve blood glucose levels. SEEK MEDICAL CARE IF:   You have questions about medicine, activity, or diet.  You continue to have symptoms (problems such as increased thirst, urination, or weight gain). SEEK IMMEDIATE MEDICAL CARE IF:   You are vomiting or have diarrhea.  Your breath smells fruity.  You are breathing faster or slower.  You are very sleepy or incoherent.  You have numbness, tingling, or pain in your feet or hands.  You have chest pain.  Your symptoms get worse even though you have been following your caregiver's orders.  If you have any other questions or concerns.   This information is not intended to replace advice given to you by your health care provider. Make sure you discuss any questions you have with your health care provider.   Document Released: 08/21/2000 Document Revised: 05/20/2011 Document Reviewed: 11/01/2014 Elsevier Interactive Patient Education 2016 [employer]  Blood Glucose Monitoring, Adult Monitoring your blood glucose (also know as blood sugar) helps you to manage your diabetes. It also helps you and your health care provider monitor your diabetes and determine how well your treatment plan is working. WHY SHOULD YOU MONITOR YOUR BLOOD GLUCOSE?  It can help you understand how food, exercise, and medicine affect your blood glucose.  It allows you to know what your blood glucose is at any given moment. You can quickly tell if you are having low blood glucose (hypoglycemia) or high blood glucose (hyperglycemia).  It can help you and your health care provider know how to adjust your medicines.  It can help you understand how to manage an illness or adjust medicine for exercise. WHEN SHOULD YOU TEST? Your health care provider will help you decide how often you should check your blood glucose.  This may depend on the type of diabetes you have, your diabetes control, or the types of medicines you are taking. Be sure to write down all of your blood glucose readings so that this information can be reviewed with your health care provider. See below for examples of testing times that your health care provider may suggest. Type 1 Diabetes  Test at least 2 times per day if your diabetes is well controlled, if you are using an insulin pump, or if you perform multiple daily injections.  If your diabetes is not well controlled or if you are sick, you may need to test more often.  It is a good idea to also test:  Before every insulin injection.  Before and after exercise.  Between meals and 2 hours after a meal.  Occasionally between 2:00 a.m. and 3:00 a.m. Type 2 Diabetes  If you are taking insulin, test at least 2 times per day. However, it is best to test before every insulin injection.  If you take medicines by mouth (orally), test 2 times a day.  If you are on a controlled diet, test once a day.  If your diabetes is not well controlled or if you are sick, you may need to monitor more often. HOW TO MONITOR YOUR BLOOD  GLUCOSE Supplies Needed  Blood glucose meter.  Test strips for your meter. Each meter has its own strips. You must use the strips that go with your own meter.  A pricking needle (lancet).  A device that holds the lancet (lancing device).  A journal or log book to write down your results. Procedure  Wash your hands with soap and water. Alcohol is not preferred.  Prick the side of your finger (not the tip) with the lancet.  Gently milk the finger until a small drop of blood appears.  Follow the instructions that come with your meter for inserting the test strip, applying blood to the strip, and using your blood glucose meter. Other Areas to Get Blood for Testing Some meters allow you to use other areas of your body (other than your finger) to test your  blood. These areas are called alternative sites. The most common alternative sites are:  The forearm.  The thigh.  The back area of the lower leg.  The palm of the hand. The blood flow in these areas is slower. Therefore, the blood glucose values you get may be delayed, and the numbers are different from what you would get from your fingers. Do not use alternative sites if you think you are having hypoglycemia. Your reading will not be accurate. Always use a finger if you are having hypoglycemia. Also, if you cannot feel your lows (hypoglycemia unawareness), always use your fingers for your blood glucose checks. ADDITIONAL TIPS FOR GLUCOSE MONITORING  Do not reuse lancets.  Always carry your supplies with you.  All blood glucose meters have a 24-hour "hotline" number to call if you have questions or need help.  Adjust (calibrate) your blood glucose meter with a control solution after finishing a few boxes of strips. BLOOD GLUCOSE RECORD KEEPING It is a good idea to keep a daily record or log of your blood glucose readings. Most glucose meters, if not all, keep your glucose records stored in the meter. Some meters come with the ability to download your records to your home computer. Keeping a record of your blood glucose readings is especially helpful if you are wanting to look for patterns. Make notes to go along with the blood glucose readings because you might forget what happened at that exact time. Keeping good records helps you and your health care provider to work together to achieve good diabetes management.    This information is not intended to replace advice given to you by your health care provider. Make sure you discuss any questions you have with your health care provider.   Document Released: 02/28/2003 Document Revised: 03/18/2014 Document Reviewed: 07/20/2012 Elsevier Interactive Patient Education [employer]

## 2015-06-28 ENCOUNTER — Encounter (HOSPITAL_COMMUNITY): Payer: Self-pay

## 2015-06-28 ENCOUNTER — Emergency Department (HOSPITAL_COMMUNITY)
Admission: EM | Admit: 2015-06-28 | Discharge: 2015-06-28 | Disposition: A | Discharge status: Home / Self Care | Payer: Medicare Other | Attending: Emergency Medicine | Admitting: Emergency Medicine

## 2015-06-28 ENCOUNTER — Emergency Department (HOSPITAL_COMMUNITY): Payer: Medicare Other

## 2015-06-28 DIAGNOSIS — E039 Hypothyroidism, unspecified: Secondary | ICD-10-CM | POA: Insufficient documentation

## 2015-06-28 DIAGNOSIS — Z79899 Other long term (current) drug therapy: Secondary | ICD-10-CM | POA: Diagnosis not present

## 2015-06-28 DIAGNOSIS — Z7984 Long term (current) use of oral hypoglycemic drugs: Secondary | ICD-10-CM | POA: Diagnosis not present

## 2015-06-28 DIAGNOSIS — Z7901 Long term (current) use of anticoagulants: Secondary | ICD-10-CM | POA: Diagnosis not present

## 2015-06-28 DIAGNOSIS — E1165 Type 2 diabetes mellitus with hyperglycemia: Secondary | ICD-10-CM | POA: Diagnosis not present

## 2015-06-28 DIAGNOSIS — I10 Essential (primary) hypertension: Secondary | ICD-10-CM | POA: Insufficient documentation

## 2015-06-28 DIAGNOSIS — R739 Hyperglycemia, unspecified: Secondary | ICD-10-CM

## 2015-06-28 LAB — BASIC METABOLIC PANEL
Anion gap: 3 — ABNORMAL LOW (ref 5–15)
BUN: 34 mg/dL — AB (ref 6–20)
CHLORIDE: 118 mmol/L — AB (ref 101–111)
CO2: 24 mmol/L (ref 22–32)
Calcium: 8.5 mg/dL — ABNORMAL LOW (ref 8.9–10.3)
Creatinine, Ser: 1.47 mg/dL — ABNORMAL HIGH (ref 0.44–1.00)
GFR calc Af Amer: 36 mL/min — ABNORMAL LOW (ref >60)
GFR calc non Af Amer: 31 mL/min — ABNORMAL LOW (ref >60)
GLUCOSE: 385 mg/dL — AB (ref 65–99)
POTASSIUM: 4.5 mmol/L (ref 3.5–5.1)
Sodium: 145 mmol/L (ref 135–145)

## 2015-06-28 LAB — CBC WITH DIFFERENTIAL/PLATELET
Basophils Absolute: 0 10*3/uL (ref 0.0–0.1)
Basophils Relative: 0 %
EOS PCT: 1 %
Eosinophils Absolute: 0.1 10*3/uL (ref 0.0–0.7)
HCT: 32.8 % — ABNORMAL LOW (ref 36.0–46.0)
HEMOGLOBIN: 10.9 g/dL — AB (ref 12.0–15.0)
LYMPHS ABS: 1.1 10*3/uL (ref 0.7–4.0)
Lymphocytes Relative: 26 %
MCH: 29.1 pg (ref 26.0–34.0)
MCHC: 33.2 g/dL (ref 30.0–36.0)
MCV: 87.5 fL (ref 78.0–100.0)
MONOS PCT: 6 %
Monocytes Absolute: 0.2 10*3/uL (ref 0.1–1.0)
Neutro Abs: 3 10*3/uL (ref 1.7–7.7)
Neutrophils Relative %: 67 %
PLATELETS: 166 10*3/uL (ref 150–400)
RBC: 3.75 MIL/uL — AB (ref 3.87–5.11)
RDW: 14.1 % (ref 11.5–15.5)
WBC: 4.4 10*3/uL (ref 4.0–10.5)

## 2015-06-28 LAB — URINE CULTURE

## 2015-06-28 LAB — CBG MONITORING, ED
GLUCOSE-CAPILLARY: 405 mg/dL — AB (ref 65–99)
GLUCOSE-CAPILLARY: 327 mg/dL — AB (ref 65–99)

## 2015-06-28 MED ORDER — INSULIN ASPART 100 UNIT/ML ~~LOC~~ SOLN
5.0000 [IU] | Freq: Once | SUBCUTANEOUS | Status: AC
  Administered 2015-06-28: 5 [IU] via INTRAVENOUS
  Filled 2015-06-28: qty 1

## 2015-06-28 MED ORDER — SODIUM CHLORIDE 0.9 % IV BOLUS (SEPSIS): 2000.0000 mL | Freq: Once | INTRAVENOUS | Status: DC

## 2015-06-28 MED ORDER — SODIUM CHLORIDE 0.9 % IV BOLUS (SEPSIS)
1000.0000 mL | Freq: Once | INTRAVENOUS | Status: AC
  Administered 2015-06-28: 1000 mL via INTRAVENOUS

## 2015-06-28 NOTE — ED Provider Notes (Signed)
CSN: [id number]     Arrival date & time 06/28/15  4098 History   First MD Initiated Contact with Patient 06/28/15 [phone]     Chief Complaint  Patient presents with  . Hyperglycemia     (Consider location/radiation/quality/duration/timing/severity/associated sxs/prior Treatment) Patient is a 82 y.o. female presenting with hyperglycemia.  Hyperglycemia Blood sugar level PTA:  >400 Severity:  Moderate Onset quality:  Gradual Duration:  2 days Chronicity:  New Diabetes status:  Controlled with oral medications Current diabetic therapy:  Glipizide and trujenta Context: change in medication   Context: not new diabetes diagnosis, not noncompliance and not recent change in diet (unsure)   Relieved by:  None tried Ineffective treatments:  None tried Associated symptoms: no abdominal pain, no dizziness and no dysuria     Past Medical History  Diagnosis Date  . Diabetes mellitus without complication (HCC)   . Hypertension   . Thyroid disease    Past Surgical History  Procedure Laterality Date  . Abdominal hysterectomy    . Fracture surgery     History reviewed. No pertinent family history. Social History  Substance Use Topics  . Smoking status: Never Smoker   . Smokeless tobacco: None  . Alcohol Use: No   OB History    No data available     Review of Systems  Gastrointestinal: Negative for abdominal pain.  Genitourinary: Negative for dysuria.  Neurological: Negative for dizziness.  All other systems reviewed and are negative.     Allergies  Review of patient's allergies indicates no known allergies.  Home Medications   Prior to Admission medications   Medication Sig Start Date End Date Taking? Authorizing Provider  alum & mag hydroxide-simeth (MINTOX) 200-200-20 MG/5ML suspension Take 30 mLs by mouth as needed for indigestion or heartburn.   Yes Historical Provider, MD  amLODipine (NORVASC) 10 MG tablet Take 10 mg by mouth daily. 05/13/15  Yes Historical Provider, MD   atorvastatin (LIPITOR) 20 MG tablet Take 20 mg by mouth daily. 04/26/15  Yes Historical Provider, MD  glipiZIDE (GLUCOTROL XL) 2.5 MG 24 hr tablet Take 2.5 mg by mouth daily. 04/18/15  Yes Historical Provider, MD  hydrochlorothiazide (HYDRODIURIL) 25 MG tablet Take 25 mg by mouth daily. 05/13/15  Yes Historical Provider, MD  levothyroxine (SYNTHROID, LEVOTHROID) 112 MCG tablet Take 112 mcg by mouth every other day. Alternate every other day with 125 mcg   Yes Historical Provider, MD  levothyroxine (SYNTHROID, LEVOTHROID) 125 MCG tablet Take 125 mcg by mouth every other day. Alternate every other day with 112 mcg   Yes Historical Provider, MD  linagliptin (TRADJENTA) 5 MG TABS tablet Take 1 tablet (5 mg total) by mouth daily. 06/26/15  Yes Rolland Porter, MD  lisinopril (PRINIVIL,ZESTRIL) 10 MG tablet Take 10 mg by mouth daily. 04/26/15  Yes Historical Provider, MD  LORazepam (ATIVAN) 0.5 MG tablet Take 0.5 mg by mouth 3 (three) times daily as needed. For anxiety sx 05/01/15  Yes Historical Provider, MD  LORazepam (ATIVAN) 0.5 MG tablet Take 0.5-1 mg by mouth 2 (two) times daily. Take 1 tab (0.5 mg) at 8 am and take 2 tabs (1 mg) at 8 pm.   Yes Historical Provider, MD  NAMZARIC 28-10 MG CP24 Take 1 capsule by mouth daily. 04/30/15  Yes Historical Provider, MD  omeprazole (PRILOSEC) 20 MG capsule Take 20 mg by mouth daily. 04/30/15  Yes Historical Provider, MD  QUEtiapine (SEROQUEL) 25 MG tablet Take 25 mg by mouth 2 (two) times daily. Take with  50 mg tab for 75 mg dose   Yes Historical Provider, MD  QUEtiapine (SEROQUEL) 50 MG tablet Take 50 mg by mouth 2 (two) times daily. Take with 25 mg tablet for 75 mg dose   Yes Historical Provider, MD  sertraline (ZOLOFT) 100 MG tablet Take 100 mg by mouth daily.   Yes Historical Provider, MD  traMADol (ULTRAM) 50 MG tablet Take 50 mg by mouth 3 (three) times daily as needed. 04/13/15  Yes Historical Provider, MD  vitamin B-12 (CYANOCOBALAMIN) 1000 MCG tablet Take 1,000 mcg by  mouth daily.   Yes Historical Provider, MD  Vitamins A & D (VITAMIN A & D) ointment Apply 1 application topically daily. Apply to sacral area every day   Yes Historical Provider, MD  XARELTO 20 MG TABS tablet Take 20 mg by mouth daily. 04/26/15  Yes Historical Provider, MD   BP 131/73 mmHg  Pulse 56  Temp(Src) 98.3 F (36.8 C) (Oral)  Resp 12  SpO2 98% Physical Exam  Constitutional: She is oriented to person, place, and time. She appears well-developed and well-nourished.  HENT:  Head: Normocephalic and atraumatic.  Neck: Normal range of motion.  Cardiovascular: Normal rate and regular rhythm.   Pulmonary/Chest: Effort normal and breath sounds normal. No stridor. No respiratory distress. She has no wheezes.  Abdominal: Soft. She exhibits no distension. There is no tenderness.  Musculoskeletal: Normal range of motion. She exhibits no edema or tenderness.  Neurological: She is alert and oriented to person, place, and time. No cranial nerve deficit.  Skin: Skin is warm and dry.  Nursing note and vitals reviewed.   ED Course  Procedures (including critical care time) Labs Review Labs Reviewed  CBC WITH DIFFERENTIAL/PLATELET - Abnormal; Notable for the following:    RBC 3.75 (*)    Hemoglobin 10.9 (*)    HCT 32.8 (*)    All other components within normal limits  BASIC METABOLIC PANEL - Abnormal; Notable for the following:    Chloride 118 (*)    Glucose, Bld 385 (*)    BUN 34 (*)    Creatinine, Ser 1.47 (*)    Calcium 8.5 (*)    GFR calc non Af Amer 31 (*)    GFR calc Af Amer 36 (*)    Anion gap 3 (*)    All other components within normal limits  CBG MONITORING, ED - Abnormal; Notable for the following:    Glucose-Capillary 405 (*)    All other components within normal limits  CBG MONITORING, ED - Abnormal; Notable for the following:    Glucose-Capillary 327 (*)    All other components within normal limits  CBC WITH DIFFERENTIAL/PLATELET  BASIC METABOLIC PANEL  CBC WITH  DIFFERENTIAL/PLATELET    Imaging Review Dg Chest 2 View  06/28/2015  CLINICAL DATA:  82 year old female with hyperglycemia. Initial encounter. EXAM: CHEST  2 VIEW COMPARISON:  05/30/2015 and earlier. FINDINGS: Semi upright AP and lateral views of the chest. Mildly lower lung volumes. Stable cardiomegaly and mediastinal contours. No pneumothorax, pulmonary edema, pleural effusion or confluent pulmonary opacity. Osteopenia. Stable visualized osseous structures, including severe lower thoracic compression fracture. IMPRESSION: No acute cardiopulmonary abnormality. Electronically Signed   By: Odessa FlemingH  Hall M.D.   On: 06/28/2015 08:25   I have personally reviewed and evaluated these images and lab results as part of my medical decision-making.   EKG Interpretation   Date/Time:  Wednesday June 28 2015 07:50:33 EDT Ventricular Rate:  66 PR Interval:  164 QRS  Duration: 93 QT Interval:  403 QTC Calculation: 422 R Axis:   17 Text Interpretation:  Sinus rhythm Probable anteroseptal infarct, old  Confirmed by [facility] MD, Barbara Cower [id number]) on 06/28/2015 8:45:46 AM      MDM   Final diagnoses:  Hyperglycemia    Hyperglycemia since moving into new facility. Unsure of cause. No obvious ifnection or ischemic causes on workup here. Blood sugar improves with fluids and insulin here. No e/o DKA. Suspect possible not getting meds vs drastig change in diet at new facility. Will continue to follow with PCP at facility.   Note: initial labs with multiple abnormalities, however repeat are closer to what they were 2 days ago and I suspect the first ones were erroneous.   New Prescriptions: Discharge Medication List as of 06/28/2015 10:59 AM       I have personally and contemperaneously reviewed labs and imaging and used in my decision making as above.   A medical screening exam was performed and I feel the patient has had an appropriate workup for their chief complaint at this time and likelihood of emergent  condition existing is low. Their vital signs are stable. They have been counseled on decision, discharge, follow up and which symptoms necessitate immediate return to the emergency department.  They verbally stated understanding and agreement with plan and discharged in stable condition.      Marily Memos, MD 06/28/15 [phone]

## 2015-06-28 NOTE — ED Notes (Signed)
Pts family member decided that patient was being uncooperative. She then lifted her mom into her vehicle without assistance from staff.

## 2015-06-28 NOTE — ED Notes (Signed)
MD at bedside. 

## 2015-06-28 NOTE — ED Notes (Signed)
Ca - 5.6  Notified primary nurse

## 2015-06-28 NOTE — ED Notes (Signed)
Bed: WA12 Expected date:  Expected time:  Means of arrival:  Comments: EMS 

## 2015-06-28 NOTE — ED Notes (Signed)
[facility]/o high blood sugar. CBG with EMS was 492.  Pt has had 750 mL intake so far. EMS vitals 160/90, sinus on the monitor

## 2015-06-28 NOTE — ED Notes (Signed)
Patient transported to X-ray 

## 2015-06-28 NOTE — ED Notes (Signed)
HgB: 5.6  Primary nurse notified

## 2015-06-28 NOTE — ED Notes (Signed)
When discharging patient. NT and myself attempted to get patient into wheelchair. Great efforts were made but patient would not stand up. TO decrease fall risk and injury [facility] will be called. Daughter notified.

## 2015-06-29 ENCOUNTER — Telehealth: Payer: Self-pay | Admitting: *Deleted

## 2015-06-29 MED ORDER — CEPHALEXIN 500 MG PO CAPS: 500.0000 mg | ORAL_CAPSULE | Freq: Two times a day (BID) | ORAL | Status: DC

## 2015-06-29 NOTE — ED Notes (Unsigned)
Post ED Visit - Positive Culture Follow-up: Successful Patient Follow-Up  Culture assessed and recommendations reviewed by: []  Cynthia [name], Pharm.D. []  Cynthia [name], Pharm.D., BCPS []  Cynthia [name], Pharm.D. []  Cynthia [name], Pharm.D., BCPS []  Cynthia [name], [address]D., BCPS, AAHIVP []  Cynthia [name], Pharm.D., BCPS, AAHIVP []  Cynthia [name], Pharm.D. []  Cynthia [name], [address]  Positive *** culture  [x]  Patient discharged without antimicrobial prescription and treatment is now indicated []  Organism is resistant to prescribed ED discharge antimicrobial []  Patient with positive blood cultures  Changes discussed with ED provider:Jeff Hedges, PA-C New antibiotic prescription Cephalexin 500mg  PO BID x 5 days [facility] [phone]  [facility] BraunKaren [facility], date 06/29/2015, time 1100   Cynthia [name], Cynthia [name] 06/29/2015, 10:58 AM

## 2015-06-29 NOTE — Progress Notes (Signed)
ED Antimicrobial Stewardship Positive Culture Follow Up   Cynthia [name] is an 82 y.o. female [facility] with a chief complaint of  Chief Complaint  Patient presents with  . Hyperglycemia  . Fall    Recent Results (from the past 720 hour(s))  Urine culture     Status: Abnormal   Collection Time: 06/26/15 11:10 AM  Result Value Ref Range Status   Specimen Description URINE, CATHETERIZED  Final   Special Requests NONE  Final   Culture >=100,000 COLONIES/mL ESCHERICHIA COLI (A)  Final   Report Status 06/28/2015 FINAL  Final   Organism ID, Bacteria ESCHERICHIA COLI (A)  Final      Susceptibility   Escherichia coli - MIC*    AMPICILLIN >=32 RESISTANT Resistant     CEFAZOLIN <=4 SENSITIVE Sensitive     CEFTRIAXONE <=1 SENSITIVE Sensitive     CIPROFLOXACIN <=0.25 SENSITIVE Sensitive     GENTAMICIN <=1 SENSITIVE Sensitive     IMIPENEM <=0.25 SENSITIVE Sensitive     NITROFURANTOIN <=16 SENSITIVE Sensitive     TRIMETH/SULFA >=320 RESISTANT Resistant     AMPICILLIN/SULBACTAM 16 INTERMEDIATE Intermediate     PIP/TAZO <=4 SENSITIVE Sensitive     * >=100,000 COLONIES/mL ESCHERICHIA COLI     [x]  Patient discharged originally without antimicrobial agent and treatment is now indicated  New antibiotic prescription: cephalexin 500 mg BID x 5 days  ED Provider: Burna FortsJeff Hedges, PA-C   [name] 06/29/2015, 9:14 AM PharmD [phone]

## 2015-09-19 ENCOUNTER — Emergency Department (HOSPITAL_COMMUNITY)
Admission: EM | Admit: 2015-09-19 | Discharge: 2015-09-19 | Disposition: A | Discharge status: Home / Self Care | Payer: Medicare Other | Attending: Emergency Medicine | Admitting: Emergency Medicine

## 2015-09-19 ENCOUNTER — Encounter (HOSPITAL_COMMUNITY): Payer: Self-pay | Admitting: Emergency Medicine

## 2015-09-19 DIAGNOSIS — I10 Essential (primary) hypertension: Secondary | ICD-10-CM | POA: Diagnosis not present

## 2015-09-19 DIAGNOSIS — E119 Type 2 diabetes mellitus without complications: Secondary | ICD-10-CM | POA: Diagnosis not present

## 2015-09-19 DIAGNOSIS — R791 Abnormal coagulation profile: Secondary | ICD-10-CM

## 2015-09-19 DIAGNOSIS — R7989 Other specified abnormal findings of blood chemistry: Secondary | ICD-10-CM | POA: Insufficient documentation

## 2015-09-19 DIAGNOSIS — Z79899 Other long term (current) drug therapy: Secondary | ICD-10-CM | POA: Diagnosis not present

## 2015-09-19 DIAGNOSIS — K625 Hemorrhage of anus and rectum: Secondary | ICD-10-CM | POA: Diagnosis present

## 2015-09-19 DIAGNOSIS — Z7984 Long term (current) use of oral hypoglycemic drugs: Secondary | ICD-10-CM | POA: Diagnosis not present

## 2015-09-19 DIAGNOSIS — K602 Anal fissure, unspecified: Secondary | ICD-10-CM | POA: Diagnosis not present

## 2015-09-19 DIAGNOSIS — Z794 Long term (current) use of insulin: Secondary | ICD-10-CM | POA: Insufficient documentation

## 2015-09-19 LAB — COMPREHENSIVE METABOLIC PANEL
ALK PHOS: 52 U/L (ref 38–126)
ALT: 16 U/L (ref 14–54)
AST: 17 U/L (ref 15–41)
Albumin: 3.8 g/dL (ref 3.5–5.0)
Anion gap: 7 (ref 5–15)
BUN: 42 mg/dL — ABNORMAL HIGH (ref 6–20)
CALCIUM: 8.8 mg/dL — AB (ref 8.9–10.3)
CO2: 23 mmol/L (ref 22–32)
CREATININE: 1.84 mg/dL — AB (ref 0.44–1.00)
Chloride: 110 mmol/L (ref 101–111)
GFR, EST AFRICAN AMERICAN: 27 mL/min — AB (ref >60)
GFR, EST NON AFRICAN AMERICAN: 24 mL/min — AB (ref >60)
Glucose, Bld: 133 mg/dL — ABNORMAL HIGH (ref 65–99)
Potassium: 3.9 mmol/L (ref 3.5–5.1)
Sodium: 140 mmol/L (ref 135–145)
Total Bilirubin: 0.5 mg/dL (ref 0.3–1.2)
Total Protein: 7.1 g/dL (ref 6.5–8.1)

## 2015-09-19 LAB — CBC WITH DIFFERENTIAL/PLATELET
Basophils Absolute: 0 10*3/uL (ref 0.0–0.1)
Basophils Relative: 0 %
EOS PCT: 2 %
Eosinophils Absolute: 0.1 10*3/uL (ref 0.0–0.7)
HEMATOCRIT: 35.2 % — AB (ref 36.0–46.0)
HEMOGLOBIN: 11.5 g/dL — AB (ref 12.0–15.0)
LYMPHS ABS: 1.1 10*3/uL (ref 0.7–4.0)
LYMPHS PCT: 21 %
MCH: 29.3 pg (ref 26.0–34.0)
MCHC: 32.7 g/dL (ref 30.0–36.0)
MCV: 89.8 fL (ref 78.0–100.0)
Monocytes Absolute: 0.4 10*3/uL (ref 0.1–1.0)
Monocytes Relative: 7 %
NEUTROS PCT: 70 %
Neutro Abs: 3.6 10*3/uL (ref 1.7–7.7)
Platelets: 241 10*3/uL (ref 150–400)
RBC: 3.92 MIL/uL (ref 3.87–5.11)
RDW: 14.7 % (ref 11.5–15.5)
WBC: 5.1 10*3/uL (ref 4.0–10.5)

## 2015-09-19 LAB — PROTIME-INR
INR: 2.55 — AB (ref 0.00–1.49)
PROTHROMBIN TIME: 26.3 s — AB (ref 11.6–15.2)

## 2015-09-19 NOTE — ED Notes (Signed)
[facility] called for transport.  

## 2015-09-19 NOTE — ED Notes (Signed)
Bed: WA02 Expected date:  Expected time:  Means of arrival:  Comments: Blood in stool

## 2015-09-19 NOTE — ED Notes (Signed)
MD at bedside. 

## 2015-09-19 NOTE — ED Notes (Addendum)
Per EMS[facility] where staff noticed blood in the pt's diaper. Per EMS, staff vague in describing amount and color. Denies abdominal pain. Hx dementia.

## 2015-09-19 NOTE — Discharge Instructions (Signed)
Hold the patient's Cynthia [name] the next 2-3 days.  Contact her primary care doctor notify about the blood and also findings from the emergency room.     Anal Fissure, Adult An anal fissure is a small tear or crack in the skin around the anus. Bleeding from a fissure usually stops on its own within a few minutes. However, bleeding will often occur again with each bowel movement until the crack heals. CAUSES This condition may be caused by:  Passing large, hard stool (feces).  Frequent diarrhea.  Constipation.  Inflammatory bowel disease (Crohn disease or ulcerative colitis).  Infections.  Anal sex. SYMPTOMS Symptoms of this condition include:  Bleeding from the rectum.  Small amounts of blood seen on your stool, on toilet paper, or in the toilet after a bowel movement.  Painful bowel movements.  Itching or irritation around the anus. DIAGNOSIS A health care provider may diagnose this condition by closely examining the anal area. An anal fissure can usually be seen with careful inspection. In some cases, a rectal exam may be performed, or a short tube (anoscope) may be used to examine the anal canal. TREATMENT Treatment for this condition may include:  Taking steps to avoid constipation. This may include making changes to your diet, such as increasing your intake of fiber or fluid.  Taking fiber supplements. These supplements can soften your stool to help make bowel movements easier. Your health care provider may also prescribe a stool softener if your stool is often hard.  Taking sitz baths. This may help to heal the tear.  Using medicated creams or ointments. These may be prescribed to lessen discomfort. HOME CARE INSTRUCTIONS Eating and Drinking  Avoid foods that may be constipating, such as bananas and dairy products.  Drink enough fluid to keep your urine clear or pale yellow.  Maintain a diet that is high in fruits, whole grains, and vegetables. General  Instructions  Keep the anal area as clean and dry as possible.  Take sitz baths as told by your health care provider. Do not use soap in the sitz baths.  Take over-the-counter and prescription medicines only as told by your health care provider.  Use creams or ointments only as told by your health care provider.  Keep all follow-up visits as told by your health care provider. This is important. SEEK MEDICAL CARE IF:  You have more bleeding.  You have a fever.  You have diarrhea that is mixed with blood.  You continue to have pain.  Your problem is getting worse rather than better.   This information is not intended to replace advice given to you by your health care provider. Make sure you discuss any questions you have with your health care provider.   Document Released: 02/25/2005 Document Revised: 11/16/2014 Document Reviewed: 05/23/2014 Elsevier Interactive Patient Education 2016 [employer]  Hemorrhoids Hemorrhoids are swollen veins around the rectum or anus. There are two types of hemorrhoids:   Internal hemorrhoids. These occur in the veins just inside the rectum. They may poke through to the outside and become irritated and painful.  External hemorrhoids. These occur in the veins outside the anus and can be felt as a painful swelling or hard lump near the anus. CAUSES  Pregnancy.   Obesity.   Constipation or diarrhea.   Straining to have a bowel movement.   Sitting for long periods on the toilet.  Heavy lifting or other activity that caused you to strain.  Anal intercourse. SYMPTOMS   Pain.  Anal itching or irritation.   Rectal bleeding.   Fecal leakage.   Anal swelling.   One or more lumps around the anus.  DIAGNOSIS  Your caregiver may be able to diagnose hemorrhoids by visual examination. Other examinations or tests that may be performed include:   Examination of the rectal area with a gloved hand (digital rectal exam).    Examination of anal canal using a small tube (scope).   A blood test if you have lost a significant amount of blood.  A test to look inside the colon (sigmoidoscopy or colonoscopy). TREATMENT Most hemorrhoids can be treated at home. However, if symptoms do not seem to be getting better or if you have a lot of rectal bleeding, your caregiver may perform a procedure to help make the hemorrhoids get smaller or remove them completely. Possible treatments include:   Placing a rubber band at the base of the hemorrhoid to cut off the circulation (rubber band ligation).   Injecting a chemical to shrink the hemorrhoid (sclerotherapy).   Using a tool to burn the hemorrhoid (infrared light therapy).   Surgically removing the hemorrhoid (hemorrhoidectomy).   Stapling the hemorrhoid to block blood flow to the tissue (hemorrhoid stapling).  HOME CARE INSTRUCTIONS   Eat foods with fiber, such as whole grains, beans, nuts, fruits, and vegetables. Ask your doctor about taking products with added fiber in them (fibersupplements).  Increase fluid intake. Drink enough water and fluids to keep your urine clear or pale yellow.   Exercise regularly.   Go to the bathroom when you have the urge to have a bowel movement. Do not wait.   Avoid straining to have bowel movements.   Keep the anal area dry and clean. Use wet toilet paper or moist towelettes after a bowel movement.   Medicated creams and suppositories may be used or applied as directed.   Only take over-the-counter or prescription medicines as directed by your caregiver.   Take warm sitz baths for 15-20 minutes, 3-4 times a day to ease pain and discomfort.   Place ice packs on the hemorrhoids if they are tender and swollen. Using ice packs between sitz baths may be helpful.   Put ice in a plastic bag.   Place a towel between your skin and the bag.   Leave the ice on for 15-20 minutes, 3-4 times a day.   Do not use  a donut-shaped pillow or sit on the toilet for long periods. This increases blood pooling and pain.  SEEK MEDICAL CARE IF:  You have increasing pain and swelling that is not controlled by treatment or medicine.  You have uncontrolled bleeding.  You have difficulty or you are unable to have a bowel movement.  You have pain or inflammation outside the area of the hemorrhoids. MAKE SURE YOU:  Understand these instructions.  Will watch your condition.  Will get help right away if you are not doing well or get worse.   This information is not intended to replace advice given to you by your health care provider. Make sure you discuss any questions you have with your health care provider.   Document Released: 02/23/2000 Document Revised: 02/12/2012 Document Reviewed: 12/31/2011 Elsevier Interactive Patient Education [employer]

## 2015-09-19 NOTE — ED Provider Notes (Signed)
CSN: [id number]     Arrival date & time 09/19/15  1101 History   First MD Initiated Contact with Patient 09/19/15 1130     Chief Complaint  Patient presents with  . Rectal Bleeding      HPI  Expand All Collapse All   [facility] EMS[facility] where staff noticed blood in the pt's diaper. Per EMS, staff vague in describing amount and color. Denies abdominal pain. Hx dementia.        Past Medical History  Diagnosis Date  . Diabetes mellitus without complication (HCC)   . Hypertension   . Thyroid disease    Past Surgical History  Procedure Laterality Date  . Abdominal hysterectomy    . Fracture surgery     History reviewed. No pertinent family history. Social History  Substance Use Topics  . Smoking status: Never Smoker   . Smokeless tobacco: None  . Alcohol Use: No   OB History    No data available     Review of Systems  Unable to perform ROS: Dementia      Allergies  Review of patient's allergies indicates no known allergies.  Home Medications   Prior to Admission medications   Medication Sig Start Date End Date Taking? Authorizing Provider  acetaminophen (TYLENOL) 500 MG tablet Take 500 mg by mouth every 4 (four) hours as needed for mild pain.   Yes Historical Provider, MD  alum & mag hydroxide-simeth (MINTOX) 200-200-20 MG/5ML suspension Take 30 mLs by mouth as needed for indigestion or heartburn.   Yes Historical Provider, MD  amLODipine (NORVASC) 10 MG tablet Take 10 mg by mouth daily. 05/13/15  Yes Historical Provider, MD  atorvastatin (LIPITOR) 20 MG tablet Take 20 mg by mouth daily. 04/26/15  Yes Historical Provider, MD  glipiZIDE (GLUCOTROL XL) 2.5 MG 24 hr tablet Take 2.5 mg by mouth daily. 04/18/15  Yes Historical Provider, MD  hydrochlorothiazide (HYDRODIURIL) 25 MG tablet Take 25 mg by mouth daily. 05/13/15  Yes Historical Provider, MD  insulin aspart (NOVOLOG) 100 UNIT/ML injection Inject 1-7 Units into the skin 3 (three) times daily before meals.    Yes Historical Provider, MD  insulin detemir (LEVEMIR) 100 UNIT/ML injection Inject 5 Units into the skin at bedtime.   Yes Historical Provider, MD  levothyroxine (SYNTHROID, LEVOTHROID) 112 MCG tablet Take 112 mcg by mouth every other day. Alternate every other day with 125 mcg   Yes Historical Provider, MD  levothyroxine (SYNTHROID, LEVOTHROID) 125 MCG tablet Take 125 mcg by mouth every other day. Alternate every other day with 112 mcg   Yes Historical Provider, MD  linagliptin (TRADJENTA) 5 MG TABS tablet Take 1 tablet (5 mg total) by mouth daily. 06/26/15  Yes Rolland PorterMark James, MD  lisinopril (PRINIVIL,ZESTRIL) 10 MG tablet Take 10 mg by mouth daily. 04/26/15  Yes Historical Provider, MD  LORazepam (ATIVAN) 0.5 MG tablet Take 0.5-1 mg by mouth 2 (two) times daily. Take 1 tab (0.5 mg) at 8 am and take 2 tabs (1 mg) at 8 pm.   Yes Historical Provider, MD  NAMZARIC 28-10 MG CP24 Take 1 capsule by mouth daily. 04/30/15  Yes Historical Provider, MD  omeprazole (PRILOSEC) 20 MG capsule Take 20 mg by mouth daily. 04/30/15  Yes Historical Provider, MD  QUEtiapine (SEROQUEL) 25 MG tablet Take 25 mg by mouth 2 (two) times daily. Take with 50 mg tab for 75 mg dose   Yes Historical Provider, MD  QUEtiapine (SEROQUEL) 50 MG tablet Take 50  mg by mouth 2 (two) times daily. Take with 25 mg tablet for 75 mg dose   Yes Historical Provider, MD  sertraline (ZOLOFT) 100 MG tablet Take 100 mg by mouth daily.   Yes Historical Provider, MD  vitamin B-12 (CYANOCOBALAMIN) 1000 MCG tablet Take 1,000 mcg by mouth daily.   Yes Historical Provider, MD  Vitamins A & D (VITAMIN A & D) ointment Apply 1 application topically daily. Apply to sacral area every day   Yes Historical Provider, MD  XARELTO 20 MG TABS tablet Take 20 mg by mouth daily. 04/26/15  Yes Historical Provider, MD  cephALEXin (KEFLEX) 500 MG capsule Take 1 capsule (500 mg total) by mouth 2 (two) times daily. Patient not taking: Reported on 09/19/2015 06/29/15   Eyvonne Mechanic, PA-C   BP 137/56 mmHg  Pulse 65  Temp(Src) 97.6 F (36.4 C) (Oral)  SpO2 100% Physical Exam  Constitutional: She appears well-developed and well-nourished. No distress.  HENT:  Head: Normocephalic and atraumatic.  Eyes: Pupils are equal, round, and reactive to light.  Neck: Normal range of motion.  Cardiovascular: Normal rate and intact distal pulses.   Pulmonary/Chest: No respiratory distress.  Abdominal: Normal appearance. She exhibits no distension.  Genitourinary: Rectal exam shows external hemorrhoid and fissure.  Musculoskeletal: Normal range of motion.  Neurological: She is alert. No cranial nerve deficit.  Skin: Skin is warm and dry. No rash noted.  Nursing note and vitals reviewed.   ED Course  Procedures (including critical care time) Labs Review Labs Reviewed  CBC WITH DIFFERENTIAL/PLATELET - Abnormal; Notable for the following:    Hemoglobin 11.5 (*)    HCT 35.2 (*)    All other components within normal limits  COMPREHENSIVE METABOLIC PANEL - Abnormal; Notable for the following:    Glucose, Bld 133 (*)    BUN 42 (*)    Creatinine, Ser 1.84 (*)    Calcium 8.8 (*)    GFR calc non Af Amer 24 (*)    GFR calc Af Amer 27 (*)    All other components within normal limits  PROTIME-INR - Abnormal; Notable for the following:    Prothrombin Time 26.3 (*)    INR 2.55 (*)    All other components within normal limits    Imaging Review No results found. I have personally reviewed and evaluated these images and lab results as part of my medical decision-making.    MDM   Final diagnoses:  Rectal fissure  Elevated INR        Nelva Nay, MD 09/19/15 1407

## 2016-03-01 ENCOUNTER — Emergency Department (HOSPITAL_COMMUNITY): Payer: Medicare Other

## 2016-03-01 ENCOUNTER — Encounter (HOSPITAL_COMMUNITY): Payer: Self-pay | Admitting: Emergency Medicine

## 2016-03-01 ENCOUNTER — Emergency Department (HOSPITAL_COMMUNITY)
Admission: EM | Admit: 2016-03-01 | Discharge: 2016-03-02 | Disposition: A | Discharge status: Home / Self Care | Payer: Medicare Other | Attending: Emergency Medicine | Admitting: Emergency Medicine

## 2016-03-01 DIAGNOSIS — R0789 Other chest pain: Secondary | ICD-10-CM | POA: Diagnosis not present

## 2016-03-01 DIAGNOSIS — E1122 Type 2 diabetes mellitus with diabetic chronic kidney disease: Secondary | ICD-10-CM | POA: Insufficient documentation

## 2016-03-01 DIAGNOSIS — E039 Hypothyroidism, unspecified: Secondary | ICD-10-CM | POA: Diagnosis not present

## 2016-03-01 DIAGNOSIS — Z79899 Other long term (current) drug therapy: Secondary | ICD-10-CM | POA: Diagnosis not present

## 2016-03-01 DIAGNOSIS — Z794 Long term (current) use of insulin: Secondary | ICD-10-CM | POA: Insufficient documentation

## 2016-03-01 DIAGNOSIS — N189 Chronic kidney disease, unspecified: Secondary | ICD-10-CM | POA: Insufficient documentation

## 2016-03-01 DIAGNOSIS — R079 Chest pain, unspecified: Secondary | ICD-10-CM | POA: Diagnosis present

## 2016-03-01 DIAGNOSIS — I129 Hypertensive chronic kidney disease with stage 1 through stage 4 chronic kidney disease, or unspecified chronic kidney disease: Secondary | ICD-10-CM | POA: Diagnosis not present

## 2016-03-01 HISTORY — DX: Unspecified dementia, unspecified severity, without behavioral disturbance, psychotic disturbance, mood disturbance, and anxiety: F03.90

## 2016-03-01 HISTORY — DX: Hypothyroidism, unspecified: E03.9

## 2016-03-01 HISTORY — DX: Chronic kidney disease, unspecified: N18.9

## 2016-03-01 LAB — BASIC METABOLIC PANEL
Anion gap: 9 (ref 5–15)
BUN: 29 mg/dL — AB (ref 6–20)
CHLORIDE: 106 mmol/L (ref 101–111)
CO2: 23 mmol/L (ref 22–32)
Calcium: 9.4 mg/dL (ref 8.9–10.3)
Creatinine, Ser: 1.84 mg/dL — ABNORMAL HIGH (ref 0.44–1.00)
GFR calc Af Amer: 27 mL/min — ABNORMAL LOW (ref >60)
GFR calc non Af Amer: 24 mL/min — ABNORMAL LOW (ref >60)
GLUCOSE: 211 mg/dL — AB (ref 65–99)
POTASSIUM: 4.3 mmol/L (ref 3.5–5.1)
Sodium: 138 mmol/L (ref 135–145)

## 2016-03-01 LAB — CBC WITH DIFFERENTIAL/PLATELET
Basophils Absolute: 0 10*3/uL (ref 0.0–0.1)
Basophils Relative: 0 %
EOS PCT: 1 %
Eosinophils Absolute: 0.1 10*3/uL (ref 0.0–0.7)
HCT: 40.4 % (ref 36.0–46.0)
HEMOGLOBIN: 13.1 g/dL (ref 12.0–15.0)
LYMPHS ABS: 1.2 10*3/uL (ref 0.7–4.0)
LYMPHS PCT: 23 %
MCH: 28.8 pg (ref 26.0–34.0)
MCHC: 32.4 g/dL (ref 30.0–36.0)
MCV: 88.8 fL (ref 78.0–100.0)
Monocytes Absolute: 0.3 10*3/uL (ref 0.1–1.0)
Monocytes Relative: 6 %
Neutro Abs: 3.7 10*3/uL (ref 1.7–7.7)
Neutrophils Relative %: 70 %
PLATELETS: 228 10*3/uL (ref 150–400)
RBC: 4.55 MIL/uL (ref 3.87–5.11)
RDW: 14.9 % (ref 11.5–15.5)
WBC: 5.3 10*3/uL (ref 4.0–10.5)

## 2016-03-01 LAB — I-STAT TROPONIN, ED
Troponin i, poc: 0.02 ng/mL (ref 0.00–0.08)
Troponin i, poc: 0.03 ng/mL (ref 0.00–0.08)

## 2016-03-01 LAB — TSH: TSH: 30.991 u[IU]/mL — ABNORMAL HIGH (ref 0.350–4.500)

## 2016-03-01 NOTE — ED Triage Notes (Signed)
[facility] with c/o chest pain starting at 1815. Pt does have a hx of dementia and was unable to provide additional information about her pain, given 324 asa en route.

## 2016-03-01 NOTE — ED Provider Notes (Signed)
[facility]-EMERGENCY DEPT Provider Note   CSN: [id number] Arrival date & time: 03/01/16  1905     History   Chief Complaint Chief Complaint  Patient presents with  . Chest Pain    HPI Cynthia [name] is a 82 y.o. female.  LEVEL 5 CAVEAT-Dementia  HPI Cynthia [name] is a 82 y.o. female with PMH significant for CKD, Dementia, DM, HTN who [facility] with complaint of chest pain.  She received 324 mg ASA. She currently denies any chest pain. Family at bedside.  Associated symptoms include SOB.  Daughter states she has "an irregular heart rhythm".  No prior cardiac procedures.  She denies pain elsewhere.  She has been in her usual state of health.   Past Medical History:  Diagnosis Date  . CKD (chronic kidney disease)   . Dementia   . Diabetes mellitus without complication (HCC)   . Hypertension   . Hypothyroid   . Hypothyroid   . Thyroid disease     There are no active problems to display for this patient.   Past Surgical History:  Procedure Laterality Date  . ABDOMINAL HYSTERECTOMY    . FRACTURE SURGERY      OB History    No data available       Home Medications    Prior to Admission medications   Medication Sig Start Date End Date Taking? Authorizing Provider  acetaminophen (TYLENOL) 500 MG tablet Take 500 mg by mouth every 4 (four) hours as needed for mild pain.   Yes Historical Provider, MD  alum & mag hydroxide-simeth (MINTOX) 200-200-20 MG/5ML suspension Take 30 mLs by mouth every 6 (six) hours as needed for indigestion or heartburn.    Yes Historical Provider, MD  amLODipine (NORVASC) 10 MG tablet Take 10 mg by mouth daily. 05/13/15  Yes Historical Provider, MD  atorvastatin (LIPITOR) 20 MG tablet Take 20 mg by mouth daily. 04/26/15  Yes Historical Provider, MD  glipiZIDE (GLUCOTROL XL) 2.5 MG 24 hr tablet Take 2.5 mg by mouth daily. 04/18/15  Yes Historical Provider, MD  guaiFENesin (ROBITUSSIN) 100 MG/5ML liquid Take 200 mg by  mouth every 6 (six) hours as needed for cough.    Yes Historical Provider, MD  hydrochlorothiazide (HYDRODIURIL) 25 MG tablet Take 25 mg by mouth daily. 05/13/15  Yes Historical Provider, MD  insulin aspart (NOVOLOG) 100 UNIT/ML injection Inject 1-7 Units into the skin 3 (three) times daily before meals. 150-199=1 unit 200-249=3 units 250-299=5 units 300-349=7 units   Yes Historical Provider, MD  insulin detemir (LEVEMIR) 100 UNIT/ML injection Inject 5 Units into the skin at bedtime.   Yes Historical Provider, MD  levothyroxine (SYNTHROID, LEVOTHROID) 112 MCG tablet Take 112 mcg by mouth every other day. Alternate every other day with 125 mcg   Yes Historical Provider, MD  levothyroxine (SYNTHROID, LEVOTHROID) 125 MCG tablet Take 125 mcg by mouth every other day. Alternate every other day with 112 mcg   Yes Historical Provider, MD  linagliptin (TRADJENTA) 5 MG TABS tablet Take 1 tablet (5 mg total) by mouth daily. 06/26/15  Yes Rolland PorterMark James, MD  lisinopril (PRINIVIL,ZESTRIL) 10 MG tablet Take 10 mg by mouth daily. 04/26/15  Yes Historical Provider, MD  loperamide (IMODIUM) 2 MG capsule Take 2 mg by mouth every 3 (three) hours as needed for diarrhea or loose stools.   Yes Historical Provider, MD  LORazepam (ATIVAN) 0.5 MG tablet Take 0.5-1 mg by mouth 2 (two) times daily. Take 1 tab (0.5 mg) at  8 am and take 2 tabs (1 mg) at 8 pm.   Yes Historical Provider, MD  magnesium hydroxide (MILK OF MAGNESIA) 400 MG/5ML suspension Take 30 mLs by mouth at bedtime as needed for mild constipation.   Yes Historical Provider, MD  NAMZARIC 28-10 MG CP24 Take 1 capsule by mouth daily. 04/30/15  Yes Historical Provider, MD  neomycin-bacitracin-polymyxin (NEOSPORIN) ointment Apply 1 application topically daily as needed for wound care. apply to eye   Yes Historical Provider, MD  omeprazole (PRILOSEC) 20 MG capsule Take 20 mg by mouth daily. 04/30/15  Yes Historical Provider, MD  ondansetron (ZOFRAN-ODT) 8 MG disintegrating  tablet Take 8 mg by mouth every 8 (eight) hours as needed for nausea or vomiting.   Yes Historical Provider, MD  QUEtiapine (SEROQUEL) 25 MG tablet Take 75 mg by mouth 2 (two) times daily. Take with 50 mg tab for 75 mg dose    Yes Historical Provider, MD  QUEtiapine (SEROQUEL) 50 MG tablet Take 75 mg by mouth 2 (two) times daily. Take with 25 mg tablet for 75 mg dose    Yes Historical Provider, MD  sertraline (ZOLOFT) 100 MG tablet Take 100 mg by mouth daily.   Yes Historical Provider, MD  sodium chloride 0.9 % injection Inject 3 mLs into the vein daily as needed (cleaning wounds).   Yes Historical Provider, MD  vitamin B-12 (CYANOCOBALAMIN) 1000 MCG tablet Take 1,000 mcg by mouth daily.   Yes Historical Provider, MD  Vitamins A & D (VITAMIN A & D) ointment Apply 1 application topically daily. Apply to sacral area every day   Yes Historical Provider, MD  XARELTO 20 MG TABS tablet Take 20 mg by mouth daily. 04/26/15  Yes Historical Provider, MD    Family History No family history on file.  Social History Social History  Substance Use Topics  . Smoking status: Never Smoker  . Smokeless tobacco: Never Used  . Alcohol use No     Allergies   Patient has no known allergies.   Review of Systems Review of Systems  Unable to perform ROS: Dementia     Physical Exam Updated Vital Signs BP 156/61   Pulse 71   Temp 98 F (36.7 C) (Oral)   Resp 13   SpO2 97%   Physical Exam  Constitutional: She is oriented to person, place, and time. She appears well-developed and well-nourished.  Non-toxic appearance. She does not have a sickly appearance. She does not appear ill.  Elderly.  HENT:  Head: Normocephalic and atraumatic.  Mouth/Throat: Oropharynx is clear and moist.  Eyes: Conjunctivae are normal.  Neck: Normal range of motion. Neck supple.  Cardiovascular: Normal rate and regular rhythm.   Pulmonary/Chest: Effort normal and breath sounds normal. No accessory muscle usage or stridor.  No respiratory distress. She has no wheezes. She has no rhonchi. She has no rales.  Abdominal: Soft. Bowel sounds are normal. She exhibits no distension. There is no tenderness.  Musculoskeletal: Normal range of motion.  Lymphadenopathy:    She has no cervical adenopathy.  Neurological: She is alert and oriented to person, place, and time.  Speech clear without dysarthria.  Skin: Skin is warm and dry.  Psychiatric: She has a normal mood and affect. Her behavior is normal.     ED Treatments / Results  Labs (all labs ordered are listed, but only abnormal results are displayed) Labs Reviewed  TSH - Abnormal; Notable for the following:       Result Value  TSH 30.991 (*)    All other components within normal limits  BASIC METABOLIC PANEL - Abnormal; Notable for the following:    Glucose, Bld 211 (*)    BUN 29 (*)    Creatinine, Ser 1.84 (*)    GFR calc non Af Amer 24 (*)    GFR calc Af Amer 27 (*)    All other components within normal limits  CBC WITH DIFFERENTIAL/PLATELET  URINALYSIS, ROUTINE W REFLEX MICROSCOPIC  I-STAT TROPOININ, ED  I-STAT TROPOININ, ED    EKG  EKG Interpretation  Date/Time:  Friday March 01 2016 19:09:10 EST Ventricular Rate:  69 PR Interval:  178 QRS Duration: 84 QT Interval:  396 QTC Calculation: 424 R Axis:   -7 Text Interpretation:  Normal sinus rhythm Left ventricular hypertrophy Nonspecific ST and T wave abnormality Abnormal ECG Confirmed by [facility] MD, Barbara Cower [phone]) on 03/01/2016 7:16:54 PM       Radiology Dg Chest 2 View  Result Date: 03/01/2016 CLINICAL DATA:  Chest pain, onset at 18:15 EXAM: CHEST  2 VIEW COMPARISON:  06/28/2015 FINDINGS: Stable moderate cardiomegaly. The lungs are clear. There is no pleural effusion. The pulmonary vasculature is normal. There is stable severe compression of T12. IMPRESSION: Stable cardiomegaly.  No consolidation or effusion. Electronically Signed   By: Ellery Plunk M.D.   On: 03/01/2016 20:21     Procedures Procedures (including critical care time)  Medications Ordered in ED Medications - No data to display   Initial Impression / Assessment and Plan / ED Course  I have reviewed the triage vital signs and the nursing notes.  Pertinent labs & imaging results that were available during my care of the patient were reviewed by me and considered in my medical decision making (see chart for details).  Clinical Course    Patient with dementia presents with episode of chest pain.  Vitals stable.  Physical exam as above.  She has not had any episodes of chest pain throughout ED stay.  No prior cardiac workups.  EKG without acute changes.  Troponin x 2 normal.  Labs remarkable for creatinine of 1.84, which appears to be her baseline.  CXR clear.  Again, she has remained symptom free throughout her ED stay with normal vitals and I have low suspicion for acute cause for her pain at this time. TSH is 30, she will follow up with her PCP for this.  Discussed this with her daughter as well.  Return precautions discussed.  Stable for discharge.    Final Clinical Impressions(s) / ED Diagnoses   Final diagnoses:  Atypical chest pain    New Prescriptions New Prescriptions   No medications on file     Gwinda Maine 03/02/16 0017    Marily Memos, MD 03/02/16 1104

## 2016-03-02 NOTE — Discharge Instructions (Signed)
We did not find an emergent cause for your pain today.  However, we did note and elevation in your TSH. It was 30.991.  Your creatinine is elevated as well at 1.84, this needs to be closely monitored as well.  Please follow up with your primary care physician for changes in your Synthroid dose.  Return to the ED for any new or concerning symptoms.

## 2016-03-02 NOTE — ED Notes (Signed)
[facility] contacted for tx back to [facility] 

## 2016-04-15 ENCOUNTER — Non-Acute Institutional Stay (SKILLED_NURSING_FACILITY): Payer: Medicare Other | Admitting: Internal Medicine

## 2016-04-15 ENCOUNTER — Encounter: Payer: Self-pay | Admitting: Internal Medicine

## 2016-04-15 ENCOUNTER — Other Ambulatory Visit: Payer: Self-pay | Admitting: *Deleted

## 2016-04-15 DIAGNOSIS — F0391 Unspecified dementia with behavioral disturbance: Secondary | ICD-10-CM | POA: Diagnosis not present

## 2016-04-15 DIAGNOSIS — E1122 Type 2 diabetes mellitus with diabetic chronic kidney disease: Secondary | ICD-10-CM | POA: Insufficient documentation

## 2016-04-15 DIAGNOSIS — E034 Atrophy of thyroid (acquired): Secondary | ICD-10-CM | POA: Diagnosis not present

## 2016-04-15 DIAGNOSIS — K219 Gastro-esophageal reflux disease without esophagitis: Secondary | ICD-10-CM | POA: Insufficient documentation

## 2016-04-15 DIAGNOSIS — E1159 Type 2 diabetes mellitus with other circulatory complications: Secondary | ICD-10-CM

## 2016-04-15 DIAGNOSIS — R079 Chest pain, unspecified: Secondary | ICD-10-CM | POA: Diagnosis not present

## 2016-04-15 DIAGNOSIS — E785 Hyperlipidemia, unspecified: Secondary | ICD-10-CM | POA: Diagnosis not present

## 2016-04-15 DIAGNOSIS — I152 Hypertension secondary to endocrine disorders: Secondary | ICD-10-CM | POA: Insufficient documentation

## 2016-04-15 DIAGNOSIS — I499 Cardiac arrhythmia, unspecified: Secondary | ICD-10-CM

## 2016-04-15 DIAGNOSIS — I10 Essential (primary) hypertension: Secondary | ICD-10-CM

## 2016-04-15 DIAGNOSIS — Z794 Long term (current) use of insulin: Secondary | ICD-10-CM

## 2016-04-15 DIAGNOSIS — N184 Chronic kidney disease, stage 4 (severe): Secondary | ICD-10-CM

## 2016-04-15 DIAGNOSIS — F03918 Unspecified dementia, unspecified severity, with other behavioral disturbance: Secondary | ICD-10-CM | POA: Insufficient documentation

## 2016-04-15 DIAGNOSIS — F29 Unspecified psychosis not due to a substance or known physiological condition: Secondary | ICD-10-CM

## 2016-04-15 DIAGNOSIS — E1169 Type 2 diabetes mellitus with other specified complication: Secondary | ICD-10-CM

## 2016-04-15 LAB — CBC AND DIFFERENTIAL
HEMATOCRIT: 40 % (ref 36–46)
HEMOGLOBIN: 12.7 g/dL (ref 12.0–16.0)
Neutrophils Absolute: 3 /uL
PLATELETS: 200 10*3/uL (ref 150–399)
WBC: 4.9 10*3/mL

## 2016-04-15 LAB — HEMOGLOBIN A1C: Hemoglobin A1C: 8.2

## 2016-04-15 MED ORDER — LORAZEPAM 0.5 MG PO TABS: ORAL_TABLET | ORAL | 0 refills | Status: DC

## 2016-04-15 NOTE — Telephone Encounter (Signed)
[facility] #[phone] Fax:[phone]  

## 2016-04-15 NOTE — Progress Notes (Signed)
Patient ID: Cynthia [name], female   DOB: 02-07-1928, 83 y.o.   MRN: [id number]    HISTORY AND PHYSICAL   DATE: 04/15/2016  Location:    [place] Room Number: 361 B Place of Service: SNF (31)   Extended Emergency Contact Information Primary Emergency Contact: Elmore,[name] Address: [place] Phone: [phone] Relation: Daughter  Advanced Directive information    Chief Complaint  Patient presents with  . New Admit To SNF    HPI:  83 yo female seen today as a new admission into SNF from [facility]. She has a hx of DM, CKD, dementia, HTN and hypothyroidism, cardiac arrhythmia. She presents to SNF for long term care.  Today she c/o CP. Unable to provide further HPI. She is a poor historian due to dementia. Hx obtained form chart  Dementia with behavioral disturbance/psychosis/depression - mood stable on zoloft, seroquel and lorazepam. Takes namzaric for cognition. Albumin nml  Cardiac arrhythmia - rate controlled without medication. Takes xeralto for anticoagulation  GERD - stable on omeprazole. Takes prn zofran  HTN - suboptimally controlled on lisinopril, amlodipine and HCTZ  DM - CBG 152 today. No low BS reactions. Takes levemir 5 units qhs, novolog SSI, glipizide, tradjenta.  She has hyperlipidemia/HTN hx  Hyperlipidemia - takes lipitor. No recent lipid panel in EPIC  Hypothyroidism - TSH 30.991 in Dec 2017. Takes levothyroxine 125 mcg daily  CKD - stage 4. Cr 1.84  Past Medical History:  Diagnosis Date  . CKD (chronic kidney disease)   . Dementia   . Diabetes mellitus without complication ([place])   . Hypertension   . Hypothyroid   . Hypothyroid   . Thyroid disease     Past Surgical History:  Procedure Laterality Date  . ABDOMINAL HYSTERECTOMY    . FRACTURE SURGERY      Patient Care Team: No Pcp Per Patient as PCP - General (General Practice)  Social History   Social History  .  Marital status: Widowed    Spouse name: N/A  . Number of children: N/A  . Years of education: N/A   Occupational History  . Not on file.   Social History Main Topics  . Smoking status: Never Smoker  . Smokeless tobacco: Never Used  . Alcohol use No  . Drug use: No  . Sexual activity: Not on file   Other Topics Concern  . Not on file   Social History Narrative  . No narrative on file     reports that she has never smoked. She has never used smokeless tobacco. She reports that she does not drink alcohol or use drugs.  History reviewed. No pertinent family history. Unable to fully assess due to pt's dementia No family status information on file.     There is no immunization history on file for this patient.  No Known Allergies  Medications: Patient's Medications  New Prescriptions   No medications on file  Previous Medications   ACETAMINOPHEN (TYLENOL) 500 MG TABLET    Take 500 mg by mouth every 6 (six) hours as needed for mild pain, fever or headache (Give 1 tablet 3 times a day for pain).    AMLODIPINE (NORVASC) 10 MG TABLET    Take 10 mg by mouth daily.   ATORVASTATIN (LIPITOR) 20 MG TABLET    Take 20 mg by mouth daily.   GLIPIZIDE (GLUCOTROL XL) 5 MG 24 HR TABLET    Take 2.5 mg by mouth daily.  HYDROCHLOROTHIAZIDE (HYDRODIURIL) 25 MG TABLET    Take 25 mg by mouth daily.   INSULIN ASPART (NOVOLOG) 100 UNIT/ML INJECTION    Inject 1-7 Units into the skin 3 (three) times daily before meals. 150-199=1 unit 200-249=3 units 250-299=5 units 300-349=7 units   INSULIN DETEMIR (LEVEMIR) 100 UNIT/ML INJECTION    Inject 5 Units into the skin at bedtime.   LEVOTHYROXINE (SYNTHROID, LEVOTHROID) 125 MCG TABLET    Take 125 mcg by mouth daily.    LINAGLIPTIN (TRADJENTA) 5 MG TABS TABLET    Take 1 tablet (5 mg total) by mouth daily.   LISINOPRIL (PRINIVIL,ZESTRIL) 10 MG TABLET    Take 10 mg by mouth daily.   LOPERAMIDE (IMODIUM) 2 MG CAPSULE    Take 2 mg by mouth every 3 (three)  hours as needed for diarrhea or loose stools.   MAGNESIUM HYDROXIDE (MILK OF MAGNESIA) 400 MG/5ML SUSPENSION    Take 30 mLs by mouth at bedtime as needed for mild constipation.   NAMZARIC 28-10 MG CP24    Take 1 capsule by mouth daily.   OMEPRAZOLE (PRILOSEC) 20 MG CAPSULE    Take 20 mg by mouth daily.   ONDANSETRON (ZOFRAN-ODT) 8 MG DISINTEGRATING TABLET    Take 8 mg by mouth every 8 (eight) hours as needed for nausea or vomiting.   QUETIAPINE (SEROQUEL) 25 MG TABLET    Take 75 mg by mouth 2 (two) times daily. Take with 50 mg tab for 75 mg dose    QUETIAPINE (SEROQUEL) 50 MG TABLET    Take 75 mg by mouth 2 (two) times daily. Take with 25 mg tablet for 75 mg dose    SERTRALINE (ZOLOFT) 100 MG TABLET    Take 100 mg by mouth daily.   VITAMIN B-12 (CYANOCOBALAMIN) 1000 MCG TABLET    Take 1,000 mcg by mouth daily.   XARELTO 20 MG TABS TABLET    Take 20 mg by mouth daily.  Modified Medications   Modified Medication Previous Medication   LORAZEPAM (ATIVAN) 0.5 MG TABLET LORazepam (ATIVAN) 0.5 MG tablet      Take one tablet by mouth once daily; Take two tablets by mouth every evening for anxiety    Take 0.5-1 mg by mouth 2 (two) times daily. Take 1 tab (0.5 mg) at 8 am and take 2 tabs (1 mg) at 8 pm.  Discontinued Medications   ALUM & MAG HYDROXIDE-SIMETH (MINTOX) [phone] MG/5ML SUSPENSION    Take 30 mLs by mouth every 6 (six) hours as needed for indigestion or heartburn.    GUAIFENESIN (ROBITUSSIN) 100 MG/5ML LIQUID    Take 200 mg by mouth every 6 (six) hours as needed for cough.    LEVOTHYROXINE (SYNTHROID, LEVOTHROID) 125 MCG TABLET    Take 125 mcg by mouth every other day. Alternate every other day with 112 mcg   NEOMYCIN-BACITRACIN-POLYMYXIN (NEOSPORIN) OINTMENT    Apply 1 application topically daily as needed for wound care. apply to eye   SODIUM CHLORIDE 0.9 % INJECTION    Inject 3 mLs into the vein daily as needed (cleaning wounds).   VITAMINS A & D (VITAMIN A & D) OINTMENT    Apply 1  application topically daily. Apply to sacral area every day    Review of Systems  Unable to perform ROS: Dementia    Vitals:   04/15/16 1059 04/15/16 1102  BP: (!) 182/96 (!) 170/90  Pulse: 69   Resp: 20   SpO2: 95%   Weight: 164 lb (74.4  kg)    Body mass index is 24.94 kg/m.  Physical Exam  Constitutional: She appears well-developed.  Frail appearing in NAD, lying in bed  HENT:  Mouth/Throat: Oropharynx is clear and moist. No oropharyngeal exudate.  MMM, no oral thrush; poor dentition  Eyes: Pupils are equal, round, and reactive to light. No scleral icterus.  Neck: Neck supple. Carotid bruit is not present. No tracheal deviation present. No thyromegaly present.  Cardiovascular: Normal rate, regular rhythm and intact distal pulses.  Exam reveals no gallop and no friction rub.   Murmur (1/6 SEM) heard. No LE edema b/l. no calf TTP.   Pulmonary/Chest: Effort normal and breath sounds normal. No stridor. No respiratory distress. She has no wheezes. She has no rales. She exhibits no tenderness (CP not reproducible).  Abdominal: Soft. Bowel sounds are normal. She exhibits distension. She exhibits no mass. There is no hepatomegaly. There is no tenderness. There is no rebound and no guarding.  Musculoskeletal: She exhibits edema (small and large joint) and deformity (small joints).  Lymphadenopathy:    She has no cervical adenopathy.  Neurological: She is alert.  Skin: Skin is warm and dry. No rash noted.  Psychiatric: She has a normal mood and affect. Her behavior is normal.     Labs reviewed: Admission on 03/01/2016, Discharged on 03/02/2016  Component Date Value Ref Range Status  . TSH 03/01/2016 30.991* 0.350 - 4.500 uIU/mL Final  . Troponin i, poc 03/01/2016 0.03  0.00 - 0.08 ng/mL Final  . Comment 3 03/01/2016          Final   Comment: Due to the release kinetics of cTnI, a negative result within the first hours of the onset of symptoms does not rule out myocardial  infarction with certainty. If myocardial infarction is still suspected, repeat the test at appropriate intervals.   . WBC 03/01/2016 5.3  4.0 - 10.5 K/uL Final  . RBC 03/01/2016 4.55  3.87 - 5.11 MIL/uL Final  . Hemoglobin 03/01/2016 13.1  12.0 - 15.0 g/dL Final  . HCT 03/01/2016 40.4  36.0 - 46.0 % Final  . MCV 03/01/2016 88.8  78.0 - 100.0 fL Final  . MCH 03/01/2016 28.8  26.0 - 34.0 pg Final  . MCHC 03/01/2016 32.4  30.0 - 36.0 g/dL Final  . RDW 03/01/2016 14.9  11.5 - 15.5 % Final  . Platelets 03/01/2016 228  150 - 400 K/uL Final  . Neutrophils Relative % 03/01/2016 70  % Final  . Neutro Abs 03/01/2016 3.7  1.7 - 7.7 K/uL Final  . Lymphocytes Relative 03/01/2016 23  % Final  . Lymphs Abs 03/01/2016 1.2  0.7 - 4.0 K/uL Final  . Monocytes Relative 03/01/2016 6  % Final  . Monocytes Absolute 03/01/2016 0.3  0.1 - 1.0 K/uL Final  . Eosinophils Relative 03/01/2016 1  % Final  . Eosinophils Absolute 03/01/2016 0.1  0.0 - 0.7 K/uL Final  . Basophils Relative 03/01/2016 0  % Final  . Basophils Absolute 03/01/2016 0.0  0.0 - 0.1 K/uL Final  . Sodium 03/01/2016 138  135 - 145 mmol/L Final  . Potassium 03/01/2016 4.3  3.5 - 5.1 mmol/L Final  . Chloride 03/01/2016 106  101 - 111 mmol/L Final  . CO2 03/01/2016 23  22 - 32 mmol/L Final  . Glucose, Bld 03/01/2016 211* 65 - 99 mg/dL Final  . BUN 03/01/2016 29* 6 - 20 mg/dL Final  . Creatinine, Ser 03/01/2016 1.84* 0.44 - 1.00 mg/dL Final  . Calcium  03/01/2016 9.4  8.9 - 10.3 mg/dL Final  . GFR calc non Af Amer 03/01/2016 24* >60 mL/min Final  . GFR calc Af Amer 03/01/2016 27* >60 mL/min Final   Comment: (NOTE) The eGFR has been calculated using the CKD EPI equation. This calculation has not been validated in all clinical situations. eGFR's persistently <60 mL/min signify possible Chronic Kidney Disease.   . Anion gap 03/01/2016 9  5 - 15 Final  . Troponin i, poc 03/01/2016 0.02  0.00 - 0.08 ng/mL Final  . Comment 3 03/01/2016           Final   Comment: Due to the release kinetics of cTnI, a negative result within the first hours of the onset of symptoms does not rule out myocardial infarction with certainty. If myocardial infarction is still suspected, repeat the test at appropriate intervals.     No results found.   Assessment/Plan   ICD-9-CM ICD-10-CM   1. Hypothyroidism due to acquired atrophy of thyroid 244.8 E03.4    246.8    2. Cardiac arrhythmia, unspecified cardiac arrhythmia type 427.9 I49.9   3. Chest pain, unspecified type 786.50 R07.9   4. Dementia with behavioral disturbance, unspecified dementia type 294.21 F03.91   5. Psychosis, unspecified psychosis type 298.9 F29   6. Type 2 diabetes mellitus with stage 4 chronic kidney disease, with long-term current use of insulin (HCC) 250.40 E11.22    585.4 N18.4    V58.67 Z79.4   7. Hyperlipidemia associated with type 2 diabetes mellitus (HCC) 250.80 E11.69    272.4 E78.5   8. Hypertension associated with diabetes ([place]) 250.80 E11.59    401.9 I10   9. Gastroesophageal reflux disease, esophagitis presence not specified 530.81 K21.9    Check TSH, free T4, CMP, lipid panel, A1c, urine microalbumin/Cr ratio  BP check q shift x 3 days then daily and record. Will make adjustment on meds if trend is >160/90.  Cont current meds as ordered  PT/OT/ST as ordered  GOAL: short term rehab then long term care. Communicated with pt and nursing.  Will follow  Cynthia Delia S. Perlie Gold  [facility] [address] [place], [place] [address] [phone] Cell (Monday-Friday 8 AM - 5 PM) [phone] After 5 PM and follow prompts

## 2016-04-16 LAB — HEPATIC FUNCTION PANEL
ALK PHOS: 62 U/L (ref 25–125)
ALK PHOS: 64 U/L (ref 25–125)
ALT: 18 U/L (ref 7–35)
ALT: 21 U/L (ref 7–35)
AST: 17 U/L (ref 13–35)
AST: 21 U/L (ref 13–35)
BILIRUBIN, TOTAL: 0.2 mg/dL
Bilirubin, Total: 0.2 mg/dL

## 2016-04-16 LAB — LIPID PANEL
CHOLESTEROL: 190 mg/dL (ref 0–200)
HDL: 56 mg/dL (ref 35–70)
LDL CALC: 112 mg/dL
TRIGLYCERIDES: 109 mg/dL (ref 40–160)

## 2016-04-16 LAB — BASIC METABOLIC PANEL
BUN: 19 mg/dL (ref 4–21)
BUN: 24 mg/dL — AB (ref 4–21)
Creatinine: 1.2 mg/dL — AB (ref 0.5–1.1)
Creatinine: 1.4 mg/dL — AB (ref 0.5–1.1)
Glucose: 169 mg/dL
Glucose: 171 mg/dL
POTASSIUM: 4.8 mmol/L (ref 3.4–5.3)
Potassium: 4.4 mmol/L (ref 3.4–5.3)
Sodium: 140 mmol/L (ref 137–147)
Sodium: 141 mmol/L (ref 137–147)

## 2016-04-16 LAB — HEMOGLOBIN A1C: HEMOGLOBIN A1C: 8

## 2016-04-16 LAB — TSH: TSH: 15.82 u[IU]/mL — AB (ref 0.41–5.90)

## 2016-04-17 ENCOUNTER — Non-Acute Institutional Stay (SKILLED_NURSING_FACILITY): Payer: Medicare Other | Admitting: Adult Health

## 2016-04-17 ENCOUNTER — Encounter: Payer: Self-pay | Admitting: Adult Health

## 2016-04-17 DIAGNOSIS — E1169 Type 2 diabetes mellitus with other specified complication: Secondary | ICD-10-CM | POA: Diagnosis not present

## 2016-04-17 DIAGNOSIS — N184 Chronic kidney disease, stage 4 (severe): Secondary | ICD-10-CM

## 2016-04-17 DIAGNOSIS — E785 Hyperlipidemia, unspecified: Secondary | ICD-10-CM

## 2016-04-17 DIAGNOSIS — Z794 Long term (current) use of insulin: Secondary | ICD-10-CM | POA: Diagnosis not present

## 2016-04-17 DIAGNOSIS — E034 Atrophy of thyroid (acquired): Secondary | ICD-10-CM | POA: Diagnosis not present

## 2016-04-17 DIAGNOSIS — E1122 Type 2 diabetes mellitus with diabetic chronic kidney disease: Secondary | ICD-10-CM | POA: Diagnosis not present

## 2016-04-17 NOTE — Progress Notes (Signed)
Location:  [facility] Room Number: 202 B Place of Service:  SNF (31)   CODE STATUS:  No Known Allergies  Chief Complaint  Patient presents with  . Acute Visit    Thyroid and diabetes     HPI:  Her tsh has improved 30.991 in Dec to her current tsh of 15.82 and her hgb a1c is 8.2.   Past Medical History:  Diagnosis Date  . CKD (chronic kidney disease)   . Dementia   . Diabetes mellitus without complication (HCC)   . Hypertension   . Hypothyroid   . Hypothyroid   . Thyroid disease     Past Surgical History:  Procedure Laterality Date  . ABDOMINAL HYSTERECTOMY    . FRACTURE SURGERY      Social History   Social History  . Marital status: Widowed    Spouse name: N/A  . Number of children: N/A  . Years of education: N/A   Occupational History  . Not on file.   Social History Main Topics  . Smoking status: Never Smoker  . Smokeless tobacco: Never Used  . Alcohol use No  . Drug use: No  . Sexual activity: Not Currently   Other Topics Concern  . Not on file   Social History Narrative  . No narrative on file   History reviewed. No pertinent family history.    VITAL SIGNS BP (!) 156/89   Pulse 69   Temp 98 F (36.7 C)   Resp 20   Ht 5\' 8"  (1.727 m)   Wt 164 lb (74.4 kg)   SpO2 95%   BMI 24.94 kg/m   Patient's Medications  New Prescriptions   No medications on file  Previous Medications   ACETAMINOPHEN (TYLENOL) 500 MG TABLET    Take 500 mg by mouth every 6 (six) hours as needed for mild pain, fever or headache (Give 1 tablet 3 times a day for pain).    AMLODIPINE (NORVASC) 10 MG TABLET    Take 10 mg by mouth daily.   ATORVASTATIN (LIPITOR) 20 MG TABLET    Take 20 mg by mouth daily.   GLIPIZIDE (GLUCOTROL XL) 5 MG 24 HR TABLET    Take 2.5 mg by mouth daily.   HYDROCHLOROTHIAZIDE (HYDRODIURIL) 25 MG TABLET    Take 25 mg by mouth daily.   INSULIN ASPART (NOVOLOG) 100 UNIT/ML INJECTION    Inject 1-7 Units into the skin 3 (three) times  daily before meals. 150-199=1 unit 200-249=3 units 250-299=5 units 300-349=7 units   INSULIN DETEMIR (LEVEMIR) 100 UNIT/ML INJECTION    Inject 5 Units into the skin at bedtime.   LEVOTHYROXINE (SYNTHROID, LEVOTHROID) 125 MCG TABLET    Take 125 mcg by mouth daily.    LINAGLIPTIN (TRADJENTA) 5 MG TABS TABLET    Take 1 tablet (5 mg total) by mouth daily.   LISINOPRIL (PRINIVIL,ZESTRIL) 10 MG TABLET    Take 10 mg by mouth daily.   LOPERAMIDE (IMODIUM) 2 MG CAPSULE    Take 2 mg by mouth every 3 (three) hours as needed for diarrhea or loose stools.   LORAZEPAM (ATIVAN) 0.5 MG TABLET    Take one tablet by mouth once daily; Take two tablets by mouth every evening for anxiety   MAGNESIUM HYDROXIDE (MILK OF MAGNESIA) 400 MG/5ML SUSPENSION    Take 30 mLs by mouth at bedtime as needed for mild constipation.   NAMZARIC 28-10 MG CP24    Take 1 capsule by mouth daily.  OMEPRAZOLE (PRILOSEC) 20 MG CAPSULE    Take 20 mg by mouth daily.   ONDANSETRON (ZOFRAN-ODT) 8 MG DISINTEGRATING TABLET    Take 8 mg by mouth every 8 (eight) hours as needed for nausea or vomiting.   QUETIAPINE (SEROQUEL) 25 MG TABLET    Take 75 mg by mouth 2 (two) times daily. Take with 50 mg tab for 75 mg dose    QUETIAPINE (SEROQUEL) 50 MG TABLET    Take 75 mg by mouth 2 (two) times daily. Take with 25 mg tablet for 75 mg dose    SERTRALINE (ZOLOFT) 100 MG TABLET    Take 100 mg by mouth daily.   VITAMIN B-12 (CYANOCOBALAMIN) 1000 MCG TABLET    Take 1,000 mcg by mouth daily.   XARELTO 20 MG TABS TABLET    Take 20 mg by mouth daily.  Modified Medications   No medications on file  Discontinued Medications   No medications on file     SIGNIFICANT DIAGNOSTIC EXAMS   LABS REVIEWED:   03-01-16: wbc 5.3; hgb 13.1; hct 40.4; mcv 88.8; plt 228; glucose 211; bun 29; creat 1.84; k+ 4.3; na++ 138; tsh 30.991  04-15-16: wbc 4.9; hgb 12.7; hct 40.1; mcv 92.3; plt 200; glucose 169; bun 23.6; creat 1.21; k+ 4.4; na++ 141; liver normal albumin 3.8;  hgb a1c 8.2 04-16-16: glucose 171; bun 19.3; creat 1.35; k+ 4.8; na++ 140; liver normal albumin 3.6; chol 190; ldl 112; trig 109; hdl 56 tsh 15.82; free T4: 0.63; hgb a1c 8.0      Review of Systems  Unable to perform ROS: Dementia    Constitutional: She appears well-developed.  HENT:  Mouth/Throat: Oropharynx is clear and moist. No oropharyngeal exudate. poor dentition  Eyes: Pupils are equal, round, and reactive to light. No scleral icterus.  Neck: Neck supple. Carotid bruit is not present. No tracheal deviation present. No thyromegaly present.  Cardiovascular: Normal rate, regular rhythm and intact distal pulses.  Exam reveals no gallop and no friction rub.   Murmur (1/6 SEM) heard.  Pulmonary/Chest: Effort normal and breath sounds normal. No stridor. No respiratory distress. She has no wheezes. She has no rales.  Abdominal: Soft. Bowel sounds are normal.  There is no hepatomegaly. There is no tenderness. There is no rebound and no guarding.  Musculoskeletal: no lower extremity edema   Lymphadenopathy:    She has no cervical adenopathy.  Neurological: She is alert.  Skin: Skin is warm and dry. No rash noted.  Psychiatric: She has a normal mood and affect. Her behavior is normal.    ASSESSMENT/ PLAN:  1. Diabetes hgb a1c 8.0; will continue glucotrol xl 2.5 mg daily; tradjenta 5 mg daily; levemir 5 units nightly and novolog SSI  2. Hypothyroidism: tsh is 15.82; free T 4: 0.63; will increase synthroid to 137 mcg daily and will check thyroid labs in 4 weeks.   3. Dyslipidemia; ldl 112; will continue lipitor 20 mg daily      MD is aware of resident's narcotic use and is in agreement with current plan of care. We will attempt to wean resident as apropriate   Synthia Innocenteborah Green NP [facility]  Contact [phone] Monday through Friday 8am- 5pm  After hours call [phone]

## 2016-05-15 LAB — TSH: TSH: 71.63 u[IU]/mL — AB (ref 0.41–5.90)

## 2016-05-21 ENCOUNTER — Encounter: Payer: Self-pay | Admitting: Adult Health

## 2016-05-21 ENCOUNTER — Non-Acute Institutional Stay (SKILLED_NURSING_FACILITY): Payer: Medicare Other | Admitting: Adult Health

## 2016-05-21 DIAGNOSIS — E785 Hyperlipidemia, unspecified: Secondary | ICD-10-CM | POA: Diagnosis not present

## 2016-05-21 DIAGNOSIS — E1159 Type 2 diabetes mellitus with other circulatory complications: Secondary | ICD-10-CM

## 2016-05-21 DIAGNOSIS — E1122 Type 2 diabetes mellitus with diabetic chronic kidney disease: Secondary | ICD-10-CM

## 2016-05-21 DIAGNOSIS — I152 Hypertension secondary to endocrine disorders: Secondary | ICD-10-CM

## 2016-05-21 DIAGNOSIS — I499 Cardiac arrhythmia, unspecified: Secondary | ICD-10-CM

## 2016-05-21 DIAGNOSIS — E034 Atrophy of thyroid (acquired): Secondary | ICD-10-CM

## 2016-05-21 DIAGNOSIS — F29 Unspecified psychosis not due to a substance or known physiological condition: Secondary | ICD-10-CM | POA: Diagnosis not present

## 2016-05-21 DIAGNOSIS — E1169 Type 2 diabetes mellitus with other specified complication: Secondary | ICD-10-CM

## 2016-05-21 DIAGNOSIS — Z794 Long term (current) use of insulin: Secondary | ICD-10-CM

## 2016-05-21 DIAGNOSIS — F0391 Unspecified dementia with behavioral disturbance: Secondary | ICD-10-CM | POA: Diagnosis not present

## 2016-05-21 DIAGNOSIS — I10 Essential (primary) hypertension: Secondary | ICD-10-CM

## 2016-05-21 DIAGNOSIS — F418 Other specified anxiety disorders: Secondary | ICD-10-CM

## 2016-05-21 DIAGNOSIS — N184 Chronic kidney disease, stage 4 (severe): Secondary | ICD-10-CM | POA: Diagnosis not present

## 2016-05-21 NOTE — Progress Notes (Addendum)
Location:   [facility] Room Number: 202 B Place of Service:  SNF (31)   CODE STATUS: DNR  No Known Allergies  Chief Complaint  Patient presents with  . Medical Management of Chronic Issues    Routine visit    HPI:  She is a long term resident of this facility being seen for the management of her chronic illnesses. Overall there is little change in her status. She does get out of bed daily. She is unable to fully participate in the hpi or ros. There are no nursing concerns at this time.    Past Medical History:  Diagnosis Date  . CKD (chronic kidney disease)   . Dementia   . Diabetes mellitus without complication (HCC)   . Hypertension   . Hypothyroid   . Hypothyroid   . Thyroid disease     Past Surgical History:  Procedure Laterality Date  . ABDOMINAL HYSTERECTOMY    . FRACTURE SURGERY      Social History   Social History  . Marital status: Widowed    Spouse name: N/A  . Number of children: N/A  . Years of education: N/A   Occupational History  . Not on file.   Social History Main Topics  . Smoking status: Never Smoker  . Smokeless tobacco: Never Used  . Alcohol use No  . Drug use: No  . Sexual activity: Not Currently   Other Topics Concern  . Not on file   Social History Narrative  . No narrative on file   History reviewed. No pertinent family history.    VITAL SIGNS BP 128/64   Pulse 69   Temp 98 F (36.7 C)   Resp 20   Ht 5\' 8"  (1.727 m)   Wt 166 lb 3.2 oz (75.4 kg)   SpO2 95%   BMI 25.27 kg/m   Patient's Medications  New Prescriptions   No medications on file  Previous Medications   ACETAMINOPHEN (TYLENOL) 500 MG TABLET    Take 500 mg by mouth every 6 (six) hours as needed for mild pain, fever or headache (Give 1 tablet 3 times a day for pain).    AMLODIPINE (NORVASC) 10 MG TABLET    Take 10 mg by mouth daily.   ATORVASTATIN (LIPITOR) 20 MG TABLET    Take 20 mg by mouth daily.   GLIPIZIDE (GLUCOTROL XL) 5 MG 24 HR  TABLET    Take 0.5 mg by mouth daily.    HYDROCHLOROTHIAZIDE (HYDRODIURIL) 25 MG TABLET    Take 25 mg by mouth daily.   INSULIN ASPART (NOVOLOG) 100 UNIT/ML INJECTION    Inject 1-7 Units into the skin 3 (three) times daily before meals. 150-199=1 unit 200-249=3 units 250-299=5 units 300-349=7 units   INSULIN DETEMIR (LEVEMIR) 100 UNIT/ML INJECTION    Inject 5 Units into the skin at bedtime.   LEVOTHYROXINE (SYNTHROID, LEVOTHROID) 137 MCG TABLET    Take 137 mcg by mouth daily before breakfast.   LINAGLIPTIN (TRADJENTA) 5 MG TABS TABLET    Take 1 tablet (5 mg total) by mouth daily.   LISINOPRIL (PRINIVIL,ZESTRIL) 10 MG TABLET    Take 10 mg by mouth daily.   LOPERAMIDE (IMODIUM) 2 MG CAPSULE    Take 2 mg by mouth every 3 (three) hours as needed for diarrhea or loose stools.   LORAZEPAM (ATIVAN) 0.5 MG TABLET    Take one tablet by mouth once daily; Take two tablets by mouth every evening for anxiety  MAGNESIUM HYDROXIDE (MILK OF MAGNESIA) 400 MG/5ML SUSPENSION    Take 30 mLs by mouth at bedtime as needed for mild constipation.   NAMZARIC 28-10 MG CP24    Take 1 capsule by mouth daily.   OMEPRAZOLE (PRILOSEC) 20 MG CAPSULE    Take 20 mg by mouth daily.   ONDANSETRON (ZOFRAN-ODT) 8 MG DISINTEGRATING TABLET    Take 8 mg by mouth every 8 (eight) hours as needed for nausea or vomiting.   QUETIAPINE (SEROQUEL) 25 MG TABLET    Take 75 mg by mouth at bedtime. Take with 50 mg tab for 75 mg dose    QUETIAPINE (SEROQUEL) 50 MG TABLET    Take 50 mg by mouth daily. Take with 25 mg tablet for 75 mg dose    SERTRALINE (ZOLOFT) 100 MG TABLET    Take 100 mg by mouth daily.   VITAMIN B-12 (CYANOCOBALAMIN) 1000 MCG TABLET    Take 1,000 mcg by mouth daily.   XARELTO 20 MG TABS TABLET    Take 20 mg by mouth daily.  Modified Medications   No medications on file  Discontinued Medications   LEVOTHYROXINE (SYNTHROID, LEVOTHROID) 125 MCG TABLET    Take 125 mcg by mouth daily.      SIGNIFICANT DIAGNOSTIC EXAMS  LABS  REVIEWED:   03-01-16: wbc 5.3; hgb 13.1; hct 40.4; mcv 88.8; plt 228; glucose 211; bun 29; creat 1.84; k+ 4.3; na++ 138; tsh 30.991  04-15-16: wbc 4.9; hgb 12.7; hct 40.1; mcv 92.3; plt 200; glucose 169; bun 23.6; creat 1.21; k+ 4.4; na++ 141; liver normal albumin 3.8; hgb a1c 8.2 04-16-16: glucose 171; bun 19.3; creat 1.35; k+ 4.8; na++ 140; liver normal albumin 3.6; chol 190; ldl 112; trig 109; hdl 56 tsh 15.82; free T4: 0.63; hgb a1c 8.0  05-15-16: tsh 71.63 free T 4: 0.33    Review of Systems  Unable to perform ROS: Dementia    Constitutional: She appears well-developed.  HENT:  Mouth/Throat: Oropharynx is clear and moist. No oropharyngeal exudate. poor dentition  Eyes: Pupils are equal, round, and reactive to light. No scleral icterus.  Neck: Neck supple. Carotid bruit is not present. No tracheal deviation present. No thyromegaly present.  Cardiovascular: Normal rate, regular rhythm and intact distal pulses.  Exam reveals no gallop and no friction rub.   Murmur (1/6 SEM) heard.  Pulmonary/Chest: Effort normal and breath sounds normal. No stridor. No respiratory distress. She has no wheezes. She has no rales.  Abdominal: Soft. Bowel sounds are normal.  There is no hepatomegaly. There is no tenderness. There is no rebound and no guarding.  Musculoskeletal: no lower extremity edema   Lymphadenopathy:    She has no cervical adenopathy.  Neurological: She is alert.  Skin: Skin is warm and dry. No rash noted.  Psychiatric: She has a normal mood and affect. Her behavior is normal.    ASSESSMENT/ PLAN:  1. Diabetes hgb a1c 8.0; will continue glucotrol xl 2.5 mg daily; tradjenta 5 mg daily; levemir 5 units nightly and novolog 150-199: 1 unit; 200-249: 3 units; 250-299: 5 units; 300-349: 7 units; is on ace and statin   2. Hypothyroidism: tsh is 71.63; free T 4: 0.33; her labs are worse; there is question regarding her dosing. Will continue synthroid 137 mcg at this time. Will change the time  of her medication administration. Will recheck labs in 4 weeks,     3. Dyslipidemia; ldl 112; will continue lipitor 20 mg daily   4. Hypertension: will  continue norvasc 10 mg daily and lisinopril 10 mg daily and hctz 25 mg daily   5. Cardiac arrhythmia: has a history of left arm dvt: heart rate is stable; will continue xarelto 20 mg daily   6. Genella RifeGerd: will continue prilosec 20 mg daily   7. Dementia: her current weight is 166 pounds; will continue namzaric 28-10 mg daily  8. Depression with anxiety: will continue zoloft 100 mg daily and is taking ativan 0.5 mg in the AM and 1 mg in the PM for anxiety.   9. Psychosis: is on seroquel 75 mg nightly and will monitor   10. Stage IV CKD: bun 19.3; creat 1.35 will monitor    MD is aware of resident's narcotic use and is in agreement with current plan of care. We will attempt to wean resident as apropriate     Synthia Innocenteborah Eriq Hufford NP [facility]  Contact [phone] Monday through Friday 8am- 5pm  After hours call [phone]

## 2016-06-18 LAB — TSH: TSH: 4.63 (ref 0.41–5.90)

## 2016-06-21 ENCOUNTER — Encounter: Payer: Self-pay | Admitting: Adult Health

## 2016-06-21 ENCOUNTER — Non-Acute Institutional Stay (SKILLED_NURSING_FACILITY): Payer: Medicare Other | Admitting: Adult Health

## 2016-06-21 DIAGNOSIS — E785 Hyperlipidemia, unspecified: Secondary | ICD-10-CM

## 2016-06-21 DIAGNOSIS — F0391 Unspecified dementia with behavioral disturbance: Secondary | ICD-10-CM | POA: Diagnosis not present

## 2016-06-21 DIAGNOSIS — E1159 Type 2 diabetes mellitus with other circulatory complications: Secondary | ICD-10-CM | POA: Diagnosis not present

## 2016-06-21 DIAGNOSIS — Z794 Long term (current) use of insulin: Secondary | ICD-10-CM | POA: Diagnosis not present

## 2016-06-21 DIAGNOSIS — N184 Chronic kidney disease, stage 4 (severe): Secondary | ICD-10-CM

## 2016-06-21 DIAGNOSIS — E1122 Type 2 diabetes mellitus with diabetic chronic kidney disease: Secondary | ICD-10-CM | POA: Diagnosis not present

## 2016-06-21 DIAGNOSIS — E034 Atrophy of thyroid (acquired): Secondary | ICD-10-CM | POA: Diagnosis not present

## 2016-06-21 DIAGNOSIS — E1169 Type 2 diabetes mellitus with other specified complication: Secondary | ICD-10-CM | POA: Diagnosis not present

## 2016-06-21 DIAGNOSIS — F29 Unspecified psychosis not due to a substance or known physiological condition: Secondary | ICD-10-CM | POA: Diagnosis not present

## 2016-06-21 DIAGNOSIS — F418 Other specified anxiety disorders: Secondary | ICD-10-CM | POA: Diagnosis not present

## 2016-06-21 DIAGNOSIS — I10 Essential (primary) hypertension: Secondary | ICD-10-CM

## 2016-06-21 NOTE — Progress Notes (Signed)
Location:   [facility] Room Number: 202 B Place of Service:  SNF (31)   CODE STATUS: DNR  No Known Allergies  Chief Complaint  Patient presents with  . Medical Management of Chronic Issues    1 month follow up    HPI:    Past Medical History:  Diagnosis Date  . CKD (chronic kidney disease)   . Dementia   . Diabetes mellitus without complication (HCC)   . Hypertension   . Hypothyroid   . Hypothyroid   . Thyroid disease     Past Surgical History:  Procedure Laterality Date  . ABDOMINAL HYSTERECTOMY    . FRACTURE SURGERY      Social History   Social History  . Marital status: Widowed    Spouse name: N/A  . Number of children: N/A  . Years of education: N/A   Occupational History  . Not on file.   Social History Main Topics  . Smoking status: Never Smoker  . Smokeless tobacco: Never Used  . Alcohol use No  . Drug use: No  . Sexual activity: Not Currently   Other Topics Concern  . Not on file   Social History Narrative  . No narrative on file   History reviewed. No pertinent family history.    VITAL SIGNS BP 118/62   Pulse 72   Temp 97.2 F (36.2 C)   Resp 18   Ht  (1.727 m)   Wt 166 lb (75.3 kg)   SpO2 96%   BMI 25.24 kg/m   Patient's Medications  New Prescriptions   No medications on file  Previous Medications   ACETAMINOPHEN (TYLENOL) 500 MG TABLET    Take 500 mg by mouth every 6 (six) hours as needed for mild pain, fever or headache (Give 1 tablet 3 times a day for pain).    AMLODIPINE (NORVASC) 10 MG TABLET    Take 10 mg by mouth daily.   ATORVASTATIN (LIPITOR) 20 MG TABLET    Take 20 mg by mouth daily.   GLIPIZIDE (GLUCOTROL XL) 5 MG 24 HR TABLET    Take 0.5 mg by mouth daily.    HYDROCHLOROTHIAZIDE (HYDRODIURIL) 25 MG TABLET    Take 25 mg by mouth daily.   INSULIN ASPART (NOVOLOG) 100 UNIT/ML INJECTION    Inject 1-7 Units into the skin 3 (three) times daily before meals. 150-199=1 unit 200-249=3  units 250-299=5 units 300-349=7 units   INSULIN DETEMIR (LEVEMIR) 100 UNIT/ML INJECTION    Inject 5 Units into the skin at bedtime.   LEVOTHYROXINE (SYNTHROID, LEVOTHROID) 137 MCG TABLET    Take 137 mcg by mouth daily before breakfast.   LINAGLIPTIN (TRADJENTA) 5 MG TABS TABLET    Take 1 tablet (5 mg total) by mouth daily.   LISINOPRIL (PRINIVIL,ZESTRIL) 10 MG TABLET    Take 10 mg by mouth daily.   LOPERAMIDE (IMODIUM) 2 MG CAPSULE    Take 2 mg by mouth every 3 (three) hours as needed for diarrhea or loose stools.   LORAZEPAM (ATIVAN) 0.5 MG TABLET    Take one tablet by mouth once daily; Take two tablets by mouth every evening for anxiety   MAGNESIUM HYDROXIDE (MILK OF MAGNESIA) 400 MG/5ML SUSPENSION    Take 30 mLs by mouth at bedtime as needed for mild constipation.   NAMZARIC 28-10 MG CP24    Take 1 capsule by mouth daily.   OMEPRAZOLE (PRILOSEC) 20 MG CAPSULE    Take 20 mg by mouth  daily.   ONDANSETRON (ZOFRAN-ODT) 8 MG DISINTEGRATING TABLET    Take 8 mg by mouth every 8 (eight) hours as needed for nausea or vomiting.   QUETIAPINE (SEROQUEL) 25 MG TABLET    Take 25 mg by mouth every morning.    QUETIAPINE (SEROQUEL) 50 MG TABLET    Take 50 mg by mouth at bedtime.    SERTRALINE (ZOLOFT) 100 MG TABLET    Take 100 mg by mouth daily.   VITAMIN B-12 (CYANOCOBALAMIN) 1000 MCG TABLET    Take 1,000 mcg by mouth daily.   XARELTO 20 MG TABS TABLET    Take 20 mg by mouth daily.  Modified Medications   No medications on file  Discontinued Medications   No medications on file     SIGNIFICANT DIAGNOSTIC EXAMS  LABS REVIEWED:   03-01-16: wbc 5.3; hgb 13.1; hct 40.4; mcv 88.8; plt 228; glucose 211; bun 29; creat 1.84; k+ 4.3; na++ 138; tsh 30.991  04-15-16: wbc 4.9; hgb 12.7; hct 40.1; mcv 92.3; plt 200; glucose 169; bun 23.6; creat 1.21; k+ 4.4; na++ 141; liver normal albumin 3.8; hgb a1c 8.2 04-16-16: glucose 171; bun 19.3; creat 1.35; k+ 4.8; na++ 140; liver normal albumin 3.6; chol 190; ldl 112;  trig 109; hdl 56 tsh 15.82; free T4: 0.63; hgb a1c 8.0  05-15-16: tsh 71.63 free T 4: 0.33    Review of Systems  Unable to perform ROS: Dementia    Constitutional: She appears well-developed.   Mouth/Throat: Oropharynx is clear and moist. No oropharyngeal exudate.  Eyes:  No scleral icterus.  Neck: Neck supple. Carotid bruit is not present. No tracheal deviation present. No thyromegaly present.  Cardiovascular: Normal rate, regular rhythm and intact distal pulses.  Exam reveals no gallop and no friction rub.   Murmur (1/6 SEM) heard.  Pulmonary/Chest: Effort normal and breath sounds normal.  No respiratory distress. She has no wheezes.  Abdominal: Soft. Bowel sounds are normal.  There is no hepatomegaly. There is no tenderness.  Musculoskeletal: no lower extremity edema   Lymphadenopathy:    She has no cervical adenopathy.  Neurological: She is alert.  Skin: Skin is warm and dry. No rash noted.  Psychiatric: She has a normal mood and affect.    ASSESSMENT/ PLAN:  1. Diabetes hgb a1c 8.0; will continue glucotrol xl 2.5 mg daily; tradjenta 5 mg daily; levemir 5 units nightly   Will change novolog to 3 units for cbg >200  is on ace and statin   2. Hypothyroidism: tsh is 71.63; free T 4: 0.33;  Will continue synthroid 137 mcg at this time.  Her follow up labs are pending   3. Dyslipidemia; ldl 112; will continue lipitor 20 mg daily   4. Hypertension: will continue norvasc 10 mg daily and lisinopril 10 mg daily and hctz 25 mg daily   5. Cardiac arrhythmia: has a history of left arm dvt: heart rate is stable; will continue xarelto 20 mg daily   6. Genella Rife: will continue prilosec 20 mg daily   7. Dementia: her current weight is 166 pounds; will continue namzaric 28-10 mg daily  8. Depression with anxiety: will continue zoloft 100 mg daily and is taking ativan 0.5 mg in the AM and 1 mg in the PM for anxiety.   9. Psychosis: is on seroquel 75 mg nightly and will monitor   10. Stage IV  CKD: bun 19.3; creat 1.35 will monitor    MD is aware of resident's narcotic use and is  in agreement with current plan of care. We will attempt to wean resident as apropriate    Synthia Innocent NP [facility]  Contact [phone] Monday through Friday 8am- 5pm  After hours call [phone]

## 2016-07-23 ENCOUNTER — Encounter: Payer: Self-pay | Admitting: Adult Health

## 2016-07-23 ENCOUNTER — Non-Acute Institutional Stay (SKILLED_NURSING_FACILITY): Payer: Medicare Other | Admitting: Adult Health

## 2016-07-23 DIAGNOSIS — E1159 Type 2 diabetes mellitus with other circulatory complications: Secondary | ICD-10-CM

## 2016-07-23 DIAGNOSIS — I499 Cardiac arrhythmia, unspecified: Secondary | ICD-10-CM

## 2016-07-23 DIAGNOSIS — F0391 Unspecified dementia with behavioral disturbance: Secondary | ICD-10-CM

## 2016-07-23 DIAGNOSIS — Z794 Long term (current) use of insulin: Secondary | ICD-10-CM

## 2016-07-23 DIAGNOSIS — K219 Gastro-esophageal reflux disease without esophagitis: Secondary | ICD-10-CM

## 2016-07-23 DIAGNOSIS — E1169 Type 2 diabetes mellitus with other specified complication: Secondary | ICD-10-CM | POA: Diagnosis not present

## 2016-07-23 DIAGNOSIS — E034 Atrophy of thyroid (acquired): Secondary | ICD-10-CM

## 2016-07-23 DIAGNOSIS — I10 Essential (primary) hypertension: Secondary | ICD-10-CM

## 2016-07-23 DIAGNOSIS — E1122 Type 2 diabetes mellitus with diabetic chronic kidney disease: Secondary | ICD-10-CM

## 2016-07-23 DIAGNOSIS — N184 Chronic kidney disease, stage 4 (severe): Secondary | ICD-10-CM

## 2016-07-23 DIAGNOSIS — E785 Hyperlipidemia, unspecified: Secondary | ICD-10-CM | POA: Diagnosis not present

## 2016-07-23 NOTE — Progress Notes (Signed)
Location:   [facility] Room Number: 202 B Place of Service:  SNF (31)   CODE STATUS: DNR  No Known Allergies  Chief Complaint  Patient presents with  . Medical Management of Chronic Issues    1 month follow up    HPI:  She is a long term resident of this facility being seen for the management of her chronic illnesses. Overall there is little change in her status. She is unable to fully participate in the hpi or ros. She did tell me that she feels good. There are no nursing concerns at this time.    Past Medical History:  Diagnosis Date  . CKD (chronic kidney disease)   . Dementia   . Diabetes mellitus without complication (HCC)   . Hypertension   . Hypothyroid   . Hypothyroid   . Thyroid disease     Past Surgical History:  Procedure Laterality Date  . ABDOMINAL HYSTERECTOMY    . FRACTURE SURGERY      Social History   Social History  . Marital status: Widowed    Spouse name: N/A  . Number of children: N/A  . Years of education: N/A   Occupational History  . Not on file.   Social History Main Topics  . Smoking status: Never Smoker  . Smokeless tobacco: Never Used  . Alcohol use No  . Drug use: No  . Sexual activity: Not Currently   Other Topics Concern  . Not on file   Social History Narrative  . No narrative on file   History reviewed. No pertinent family history.    VITAL SIGNS BP (!) 145/91   Pulse 61   Temp 97.4 F (36.3 C)   Resp 16   Ht 5\' 8"  (1.727 m)   Wt 161 lb (73 kg)   SpO2 96%   BMI 24.48 kg/m   Patient's Medications  New Prescriptions   No medications on file  Previous Medications   ACETAMINOPHEN (TYLENOL) 500 MG TABLET    Take 500 mg by mouth every 6 (six) hours as needed for mild pain, fever or headache (Give 1 tablet 3 times a day for pain).    AMLODIPINE (NORVASC) 10 MG TABLET    Take 10 mg by mouth daily.   ATORVASTATIN (LIPITOR) 20 MG TABLET    Take 20 mg by mouth daily.   GLIPIZIDE (GLUCOTROL XL) 5 MG  24 HR TABLET    Take 0.5 mg by mouth daily.    HYDROCHLOROTHIAZIDE (HYDRODIURIL) 25 MG TABLET    Take 25 mg by mouth daily.   INSULIN ASPART (NOVOLOG) 100 UNIT/ML INJECTION    Inject 3 Units into the skin 3 (three) times daily before meals. Give before meals for blood sugar > 200.  Notify MD is Blood sugar is less than 70 or greater than 450.  Hold Insulin if less than 70   INSULIN DETEMIR (LEVEMIR) 100 UNIT/ML INJECTION    Inject 5 Units into the skin at bedtime.   LEVOTHYROXINE (SYNTHROID, LEVOTHROID) 137 MCG TABLET    Take 137 mcg by mouth daily before breakfast.   LINAGLIPTIN (TRADJENTA) 5 MG TABS TABLET    Take 1 tablet (5 mg total) by mouth daily.   LISINOPRIL (PRINIVIL,ZESTRIL) 10 MG TABLET    Take 10 mg by mouth daily.   LOPERAMIDE (IMODIUM) 2 MG CAPSULE    Take 2 mg by mouth every 3 (three) hours as needed for diarrhea or loose stools.   LORAZEPAM (ATIVAN) 0.5  MG TABLET    Take one tablet by mouth once daily; Take two tablets by mouth every evening for anxiety   MAGNESIUM HYDROXIDE (MILK OF MAGNESIA) 400 MG/5ML SUSPENSION    Take 30 mLs by mouth at bedtime as needed for mild constipation.   NAMZARIC 28-10 MG CP24    Take 1 capsule by mouth daily.   OMEPRAZOLE (PRILOSEC) 20 MG CAPSULE    Take 20 mg by mouth daily.   ONDANSETRON (ZOFRAN-ODT) 8 MG DISINTEGRATING TABLET    Take 8 mg by mouth every 8 (eight) hours as needed for nausea or vomiting.   QUETIAPINE (SEROQUEL) 25 MG TABLET    Take 25 mg by mouth every morning.    QUETIAPINE (SEROQUEL) 50 MG TABLET    Take 50 mg by mouth at bedtime.    SERTRALINE (ZOLOFT) 100 MG TABLET    Take 100 mg by mouth daily.   VITAMIN B-12 (CYANOCOBALAMIN) 1000 MCG TABLET    Take 1,000 mcg by mouth daily.   XARELTO 20 MG TABS TABLET    Take 20 mg by mouth daily.  Modified Medications   No medications on file  Discontinued Medications   No medications on file     SIGNIFICANT DIAGNOSTIC EXAMS  LABS REVIEWED:   03-01-16: wbc 5.3; hgb 13.1; hct 40.4;  mcv 88.8; plt 228; glucose 211; bun 29; creat 1.84; k+ 4.3; na++ 138; tsh 30.991  04-15-16: wbc 4.9; hgb 12.7; hct 40.1; mcv 92.3; plt 200; glucose 169; bun 23.6; creat 1.21; k+ 4.4; na++ 141; liver normal albumin 3.8; hgb a1c 8.2 04-16-16: glucose 171; bun 19.3; creat 1.35; k+ 4.8; na++ 140; liver normal albumin 3.6; chol 190; ldl 112; trig 109; hdl 56 tsh 15.82; free T4: 0.63; hgb a1c 8.0  05-15-16: tsh 71.63 free T 4: 0.33    Review of Systems  Unable to perform ROS: Dementia    Constitutional: She appears well-developed.   Mouth/Throat: Oropharynx is clear and moist. No oropharyngeal exudate.  Eyes:  No scleral icterus.  Neck: Neck supple. Carotid bruit is not present. No tracheal deviation present. No thyromegaly present.  Cardiovascular: Normal rate, regular rhythm and intact distal pulses.  Exam reveals no gallop and no friction rub.   Murmur (1/6 SEM) heard.  Pulmonary/Chest: Effort normal and breath sounds normal.  No respiratory distress. She has no wheezes.  Abdominal: Soft. Bowel sounds are normal.  There is no hepatomegaly. There is no tenderness.  Musculoskeletal: no lower extremity edema   Lymphadenopathy:    She has no cervical adenopathy.  Neurological: She is alert.  Skin: Skin is warm and dry. No rash noted.  Psychiatric: She has a normal mood and affect.    ASSESSMENT/ PLAN:  1. Diabetes hgb a1c 8.0; will continue glucotrol xl 2.5 mg daily; tradjenta 5 mg daily; levemir 5 units nightly  novolog  3 units for cbg >200  is on ace and statin   2. Hypothyroidism: tsh is 71.63; free T 4: 0.33;  Will continue synthroid 137 mcg at this time.  Her follow up labs are pending   3. Dyslipidemia; ldl 112; will continue lipitor 20 mg daily   4. Hypertension: b/p 245/91  will continue norvasc 10 mg daily and lisinopril 10 mg daily and hctz 25 mg daily   5. Cardiac arrhythmia: has a history of left arm dvt: heart rate is stable; will continue xarelto 20 mg daily   6. Genella Rife: will  continue prilosec 20 mg daily   7. Dementia: her  current weight is 161 pounds; will continue namzaric 28-10 mg daily  8. Depression with anxiety: will continue zoloft 100 mg daily and is taking ativan 0.5 mg in the AM and 1 mg in the PM for anxiety.   9. Psychosis: is on seroquel 75 mg nightly and will monitor   10. Stage IV CKD: bun 19.3; creat 1.35 will monitor    MD is aware of resident's narcotic use and is in agreement with current plan of care. We will attempt to wean resident as apropriate     Synthia Innocenteborah Hansen Carino NP [facility]  Contact [phone] Monday through Friday 8am- 5pm  After hours call [phone]

## 2016-08-19 ENCOUNTER — Encounter: Payer: Self-pay | Admitting: Internal Medicine

## 2016-08-19 ENCOUNTER — Non-Acute Institutional Stay (SKILLED_NURSING_FACILITY): Payer: Medicare Other | Admitting: Internal Medicine

## 2016-08-19 DIAGNOSIS — N184 Chronic kidney disease, stage 4 (severe): Secondary | ICD-10-CM

## 2016-08-19 DIAGNOSIS — Z794 Long term (current) use of insulin: Secondary | ICD-10-CM

## 2016-08-19 DIAGNOSIS — E034 Atrophy of thyroid (acquired): Secondary | ICD-10-CM | POA: Diagnosis not present

## 2016-08-19 DIAGNOSIS — E785 Hyperlipidemia, unspecified: Secondary | ICD-10-CM | POA: Diagnosis not present

## 2016-08-19 DIAGNOSIS — E1169 Type 2 diabetes mellitus with other specified complication: Secondary | ICD-10-CM

## 2016-08-19 DIAGNOSIS — F0391 Unspecified dementia with behavioral disturbance: Secondary | ICD-10-CM | POA: Diagnosis not present

## 2016-08-19 DIAGNOSIS — I499 Cardiac arrhythmia, unspecified: Secondary | ICD-10-CM

## 2016-08-19 DIAGNOSIS — E1122 Type 2 diabetes mellitus with diabetic chronic kidney disease: Secondary | ICD-10-CM

## 2016-08-19 DIAGNOSIS — F29 Unspecified psychosis not due to a substance or known physiological condition: Secondary | ICD-10-CM

## 2016-08-19 DIAGNOSIS — F418 Other specified anxiety disorders: Secondary | ICD-10-CM

## 2016-08-19 NOTE — Progress Notes (Signed)
Patient ID: Cynthia [name], female   DOB: [birth year], 83 y.o.   MRN: [id number]    DATE:  08/19/2016  Location:    [facility] Room Number: 202 B Place of Service: SNF (31)   Extended Emergency Contact Information Primary Emergency Contact: Elmore,[name] Address: [address]  [place] Home Phone: [phone] Relation: Daughter  Advanced Directive information Does Patient Have a Medical Advance Directive?: Yes, Type of Advance Directive: Out of facility DNR (pink MOST or yellow form), Pre-existing out of facility DNR order (yellow form or pink MOST form): Pink MOST form placed in chart (order not valid for inpatient use), Does patient want to make changes to medical advance directive?: No - Patient declined  Chief Complaint  Patient presents with  . Medical Management of Chronic Issues    Routine Visit    HPI:  83 yo female long term resident seen today for f/u. She has no concerns. No nursing issues. No falls. Appetite ok and sleeps well. She is a poor historian due to dementia. Hx obtained from chart.  DM - uncontrolled. A1c 8.2%; CBG 132 with no low BS reactions; she takes glucotrol XL 2.5 mg daily; tradjenta 5 mg daily; levemir 5 units nightly;  novolog  3 units for cbg >200. She takes ACEI and lipitor. LDL 112  Hypothyroidism - stable on synthroid 137 mcg daily. TSH 4.63 (prev 71.63); T4 free 1.2  Dyslipidemia - stable on lipitor 20 mg daily. LDL 112  Hypertension - BP stable on norvasc 10 mg daily; lisinopril 10 mg daily; HCTZ 25 mg daily   Cardiac arrhythmia - rate controlled without meds. She takes xeralto long term   Hx LUE DVT - stable. She takes long term xeralto  GERD - stable on prilosec 20 mg daily   Dementia - she has psychosis and takes seroquel. She takes namzaric 28-10 mg daily for cognition. Wt stable at 161 lbs. Albumin 3.6  Depression with anxiety/psychosis - mood stable on zoloft 100 mg daily; ativan 0.5 mg in  the AM and 1 mg in the PM for anxiety;  seroquel 75 mg nightly for behavioral changes   CKD - stage 4. Cr 1.35     Past Medical History:  Diagnosis Date  . CKD (chronic kidney disease)   . Dementia   . Diabetes mellitus without complication (HCC)   . Hypertension   . Hypothyroid   . Hypothyroid   . Thyroid disease     Past Surgical History:  Procedure Laterality Date  . ABDOMINAL HYSTERECTOMY    . FRACTURE SURGERY      Patient Care Team: Patient, No Pcp Per as PCP - General (General Practice)  Social History   Social History  . Marital status: Widowed    Spouse name: N/A  . Number of children: N/A  . Years of education: N/A   Occupational History  . Not on file.   Social History Main Topics  . Smoking status: Never Smoker  . Smokeless tobacco: Never Used  . Alcohol use No  . Drug use: No  . Sexual activity: Not Currently   Other Topics Concern  . Not on file   Social History Narrative  . No narrative on file     reports that she has never smoked. She has never used smokeless tobacco. She reports that she does not drink alcohol or use drugs.  History reviewed. No pertinent family history. No family status information on file.    Immunization History  Administered Date(s) Administered  . PPD Test 07/13/2016    No Known Allergies  Medications: Patient's Medications  New Prescriptions   No medications on file  Previous Medications   ACETAMINOPHEN (TYLENOL) 500 MG TABLET    Take 500 mg by mouth every 6 (six) hours as needed for mild pain, fever or headache (Give 1 tablet 3 times a day for pain).    AMLODIPINE (NORVASC) 10 MG TABLET    Take 10 mg by mouth daily.   ATORVASTATIN (LIPITOR) 20 MG TABLET    Take 20 mg by mouth daily.   GLIPIZIDE (GLUCOTROL XL) 5 MG 24 HR TABLET    Take 0.5 mg by mouth daily.    HYDROCHLOROTHIAZIDE (HYDRODIURIL) 25 MG TABLET    Take 25 mg by mouth daily.   INSULIN ASPART (NOVOLOG) 100 UNIT/ML INJECTION    Inject 3 Units  into the skin 3 (three) times daily before meals. Give before meals for blood sugar > 200.  Notify MD is Blood sugar is less than 70 or greater than 450.  Hold Insulin if less than 70   INSULIN DETEMIR (LEVEMIR) 100 UNIT/ML INJECTION    Inject 5 Units into the skin at bedtime.   LEVOTHYROXINE (SYNTHROID, LEVOTHROID) 137 MCG TABLET    Take 137 mcg by mouth daily before breakfast.   LINAGLIPTIN (TRADJENTA) 5 MG TABS TABLET    Take 1 tablet (5 mg total) by mouth daily.   LISINOPRIL (PRINIVIL,ZESTRIL) 10 MG TABLET    Take 10 mg by mouth daily.   LOPERAMIDE (IMODIUM) 2 MG CAPSULE    Take 2 mg by mouth every 3 (three) hours as needed for diarrhea or loose stools.   LORAZEPAM (ATIVAN) 0.5 MG TABLET    Take one tablet by mouth once daily; Take two tablets by mouth every evening for anxiety   MAGNESIUM HYDROXIDE (MILK OF MAGNESIA) 400 MG/5ML SUSPENSION    Take 30 mLs by mouth at bedtime as needed for mild constipation.   NAMZARIC 28-10 MG CP24    Take 1 capsule by mouth daily.   OMEPRAZOLE (PRILOSEC) 20 MG CAPSULE    Take 20 mg by mouth daily.   ONDANSETRON (ZOFRAN-ODT) 8 MG DISINTEGRATING TABLET    Take 8 mg by mouth every 8 (eight) hours as needed for nausea or vomiting.   QUETIAPINE (SEROQUEL) 25 MG TABLET    Take 25 mg by mouth every morning.    QUETIAPINE (SEROQUEL) 50 MG TABLET    Take 50 mg by mouth at bedtime.    SERTRALINE (ZOLOFT) 100 MG TABLET    Take 100 mg by mouth daily.   VITAMIN B-12 (CYANOCOBALAMIN) 1000 MCG TABLET    Take 1,000 mcg by mouth daily.   XARELTO 20 MG TABS TABLET    Take 20 mg by mouth daily.  Modified Medications   No medications on file  Discontinued Medications   No medications on file    Review of Systems  Unable to perform ROS: Dementia    Vitals:   08/19/16 1313  BP: 120/76  Pulse: 100  Resp: 18  Temp: 98.2 F (36.8 C)  TempSrc: Oral  SpO2: 96%  Weight: 167 lb 8 oz (76 kg)  Height: 5\' 8"  (1.727 m)   Body mass index is 25.47 kg/m.  Physical Exam    Constitutional: She appears well-developed.  Frail appearing in NAD, sitting in w/c  HENT:  Mouth/Throat: Oropharynx is clear and moist. No oropharyngeal exudate.  MMM; no oral thrush  Eyes: Pupils  are equal, round, and reactive to light. No scleral icterus.  Neck: Neck supple. Carotid bruit is not present. No tracheal deviation present. No thyromegaly present.  Cardiovascular: Normal rate, regular rhythm and intact distal pulses.  Exam reveals no gallop and no friction rub.   Murmur (1/6 SEM) heard. Trace LE edema b/l. No calf TTP  Pulmonary/Chest: Effort normal and breath sounds normal. No stridor. No respiratory distress. She has no wheezes. She has no rales.  Abdominal: Soft. Normal appearance and bowel sounds are normal. She exhibits distension. She exhibits no mass. There is no hepatomegaly. There is no tenderness. There is no rigidity, no rebound and no guarding. No hernia.  Musculoskeletal: She exhibits edema (small and large joint) and deformity (small joints).  Lymphadenopathy:    She has no cervical adenopathy.  Neurological: She is alert.  Skin: Skin is warm and dry. No rash noted.  Psychiatric: She has a normal mood and affect. Her behavior is normal.     Labs reviewed: Nursing Home on 08/19/2016  Component Date Value Ref Range Status  . TSH 06/18/2016 4.63  0.41 - 5.90 Final  Nursing Home on 07/23/2016  Component Date Value Ref Range Status  . TSH 05/15/2016 71.63* 0.41 - 5.90 uIU/mL Final  Nursing Home on 05/21/2016  Component Date Value Ref Range Status  . Hemoglobin 04/15/2016 12.7  12.0 - 16.0 g/dL Final  . HCT 16/12/9602 40  36 - 46 % Final  . Neutrophils Absolute 04/15/2016 3  /L Final  . Platelets 04/15/2016 200  150 - 399 K/L Final  . WBC 04/15/2016 4.9  10^3/mL Final  . Glucose 04/16/2016 171  mg/dL Final  . BUN 54/11/8117 19  4 - 21 mg/dL Final  . Creatinine 14/78/2956 1.4* 0.5 - 1.1 mg/dL Final  . Potassium 21/30/8657 4.8  3.4 - 5.3 mmol/L Final  .  Sodium 04/16/2016 140  137 - 147 mmol/L Final  . Alkaline Phosphatase 04/16/2016 62  25 - 125 U/L Final  . ALT 04/16/2016 18  7 - 35 U/L Final  . AST 04/16/2016 17  13 - 35 U/L Final  . Bilirubin, Total 04/16/2016 0.2  mg/dL Final  . Hemoglobin Q4O 04/15/2016 8.2   Final  . Glucose 04/16/2016 169  mg/dL Final  . BUN 96/29/5284 24* 4 - 21 mg/dL Final  . Creatinine 13/24/4010 1.2* 0.5 - 1.1 mg/dL Final  . Potassium 27/25/3664 4.4  3.4 - 5.3 mmol/L Final  . Sodium 04/16/2016 141  137 - 147 mmol/L Final  . Alkaline Phosphatase 04/16/2016 64  25 - 125 U/L Final  . ALT 04/16/2016 21  7 - 35 U/L Final  . AST 04/16/2016 21  13 - 35 U/L Final  . Bilirubin, Total 04/16/2016 0.2  mg/dL Final    No results found.   Assessment/Plan   ICD-10-CM   1. Hypothyroidism due to acquired atrophy of thyroid E03.4   2. Type 2 diabetes mellitus with stage 4 chronic kidney disease, with long-term current use of insulin (HCC) E11.22    N18.4    Z79.4   3. Hyperlipidemia associated with type 2 diabetes mellitus (HCC) E11.69    E78.5   4. Dementia with behavioral disturbance, unspecified dementia type F03.91   5. Psychosis, unspecified psychosis type F29   6. Depression with anxiety F41.8   7. Cardiac arrhythmia, unspecified cardiac arrhythmia type I49.9      Check TSH, BMP, ALT, A1c, urine microalbumin/Creatinine ratio  Cont current meds as ordered  Cont nutritional  supplements as indicated  Pt/OT/ST as indicated  Will follow  Hooper Petteway S. Ancil Linsey  [facility] [address] [place], [place] [address] [phone] Cell (Monday-Friday 8 AM - 5 PM) [phone] After 5 PM and follow prompts

## 2016-08-20 LAB — BASIC METABOLIC PANEL
BUN: 42 — AB (ref 4–21)
CREATININE: 1.5 — AB (ref 0.5–1.1)
GLUCOSE: 116
POTASSIUM: 4.4 (ref 3.4–5.3)
Sodium: 145 (ref 137–147)

## 2016-08-20 LAB — TSH: TSH: 2.06 (ref 0.41–5.90)

## 2016-08-20 LAB — LIPID PANEL
CHOLESTEROL: 142 (ref 0–200)
HDL: 62 (ref 35–70)
LDL Cholesterol: 63
Triglycerides: 83 (ref 40–160)

## 2016-08-20 LAB — HEMOGLOBIN A1C: HEMOGLOBIN A1C: 7.3

## 2016-08-20 LAB — MICROALBUMIN, URINE: MICROALB UR: 1.6

## 2016-08-20 LAB — HEPATIC FUNCTION PANEL: ALT: 11 (ref 7–35)

## 2016-09-02 ENCOUNTER — Non-Acute Institutional Stay (SKILLED_NURSING_FACILITY): Payer: Medicare Other | Admitting: Internal Medicine

## 2016-09-02 ENCOUNTER — Encounter: Payer: Self-pay | Admitting: Internal Medicine

## 2016-09-02 DIAGNOSIS — M2550 Pain in unspecified joint: Secondary | ICD-10-CM | POA: Diagnosis not present

## 2016-09-02 DIAGNOSIS — I82722 Chronic embolism and thrombosis of deep veins of left upper extremity: Secondary | ICD-10-CM | POA: Diagnosis not present

## 2016-09-02 DIAGNOSIS — F0391 Unspecified dementia with behavioral disturbance: Secondary | ICD-10-CM

## 2016-09-02 DIAGNOSIS — M7989 Other specified soft tissue disorders: Secondary | ICD-10-CM

## 2016-09-02 NOTE — Progress Notes (Signed)
Patient ID: Cynthia [name], female   DOB: 12/16/1927, 83 y.o.   MRN: [id number]    DATE:  09/02/2016  Location:    [facility] Room Number: 202 B Place of Service: SNF (31)   Extended Emergency Contact Information Primary Emergency Contact: Elmore,[name] Address[address]  [place] Home Phone: [phone] Relation: Daughter  Advanced Directive information Does Patient Have a Medical Advance Directive?: Yes, Type of Advance Directive: Out of facility DNR (pink MOST or yellow form), Pre-existing out of facility DNR order (yellow form or pink MOST form): Pink MOST form placed in chart (order not valid for inpatient use), Does patient want to make changes to medical advance directive?: No - Patient declined  Chief Complaint  Patient presents with  . Acute Visit    Swollen left hand    HPI:  83 yo female long term resident seen today for swollen left hand as reported by nursing. Pt reports occasional pain. No known trauma. She is a poor historian due to dementia. Hx obtained from chart. She has a hx left arm DVT and takes xeralto.  DM - borderline controlled. A1c 8%. Takes glucotrol xl 2.5 mg daily; tradjenta 5 mg daily; levemir 5 units nightly;  novolog 3 units for cbg >200. Takes ACEI and statin  Hypertension - BP stable on norvasc 10 mg daily and lisinopril 10 mg daily; HCTZ  25 mg daily   Cardiac arrhythmia - rate controlled without medication  Hx left arm DVT - stable on xarelto 20 mg daily   Dementia - stable on namzaric 28-10 mg daily  Depression with anxiety - mood stable on zoloft 100 mg daily; ativan 0.5 mg in the AM and 1 mg in the PM  Psychosis - stable on seroquel 75 mg nightly   Past Medical History:  Diagnosis Date  . CKD (chronic kidney disease)   . Dementia   . Diabetes mellitus without complication (HCC)   . Hypertension   . Hypothyroid   . Hypothyroid   . Thyroid disease     Past Surgical History:    Procedure Laterality Date  . ABDOMINAL HYSTERECTOMY    . FRACTURE SURGERY      Patient Care Team: Patient, No Pcp Per as PCP - General (General Practice)  Social History   Social History  . Marital status: Widowed    Spouse name: N/A  . Number of children: N/A  . Years of education: N/A   Occupational History  . Not on file.   Social History Main Topics  . Smoking status: Never Smoker  . Smokeless tobacco: Never Used  . Alcohol use No  . Drug use: No  . Sexual activity: Not Currently   Other Topics Concern  . Not on file   Social History Narrative  . No narrative on file     reports that she has never smoked. She has never used smokeless tobacco. She reports that she does not drink alcohol or use drugs.  History reviewed. No pertinent family history. No family status information on file.    Immunization History  Administered Date(s) Administered  . PPD Test 07/13/2016    No Known Allergies  Medications: Patient's Medications  New Prescriptions   No medications on file  Previous Medications   ACETAMINOPHEN (TYLENOL) 500 MG TABLET    Take 500 mg by mouth every 6 (six) hours as needed for mild pain, fever or headache (Give 1 tablet 3 times a day for pain).  AMLODIPINE (NORVASC) 10 MG TABLET    Take 10 mg by mouth daily.   ATORVASTATIN (LIPITOR) 20 MG TABLET    Take 20 mg by mouth daily.   GLIPIZIDE (GLUCOTROL XL) 5 MG 24 HR TABLET    Take 0.5 mg by mouth daily.    HYDROCHLOROTHIAZIDE (HYDRODIURIL) 25 MG TABLET    Take 25 mg by mouth daily.   INSULIN ASPART (NOVOLOG) 100 UNIT/ML INJECTION    Inject 3 Units into the skin 3 (three) times daily before meals. Give before meals for blood sugar > 200.  Notify MD is Blood sugar is less than 70 or greater than 450.  Hold Insulin if less than 70   INSULIN DETEMIR (LEVEMIR) 100 UNIT/ML INJECTION    Inject 5 Units into the skin at bedtime.   LEVOTHYROXINE (SYNTHROID, LEVOTHROID) 137 MCG TABLET    Take 137 mcg by mouth  daily before breakfast.   LINAGLIPTIN (TRADJENTA) 5 MG TABS TABLET    Take 1 tablet (5 mg total) by mouth daily.   LISINOPRIL (PRINIVIL,ZESTRIL) 10 MG TABLET    Take 10 mg by mouth daily.   LOPERAMIDE (IMODIUM) 2 MG CAPSULE    Take 2 mg by mouth every 3 (three) hours as needed for diarrhea or loose stools.   LORAZEPAM (ATIVAN) 0.5 MG TABLET    Take one tablet by mouth once daily; Take two tablets by mouth every evening for anxiety   MAGNESIUM HYDROXIDE (MILK OF MAGNESIA) 400 MG/5ML SUSPENSION    Take 30 mLs by mouth at bedtime as needed for mild constipation.   NAMZARIC 28-10 MG CP24    Take 1 capsule by mouth daily.   OMEPRAZOLE (PRILOSEC) 20 MG CAPSULE    Take 20 mg by mouth daily.   ONDANSETRON (ZOFRAN-ODT) 8 MG DISINTEGRATING TABLET    Take 8 mg by mouth every 8 (eight) hours as needed for nausea or vomiting.   QUETIAPINE (SEROQUEL) 25 MG TABLET    Take 25 mg by mouth every morning.    QUETIAPINE (SEROQUEL) 50 MG TABLET    Take 50 mg by mouth at bedtime.    SERTRALINE (ZOLOFT) 100 MG TABLET    Take 100 mg by mouth daily.   VITAMIN B-12 (CYANOCOBALAMIN) 1000 MCG TABLET    Take 1,000 mcg by mouth daily.   XARELTO 20 MG TABS TABLET    Take 20 mg by mouth daily.  Modified Medications   No medications on file  Discontinued Medications   No medications on file    Review of Systems  Unable to perform ROS: Dementia (memory loss)    Vitals:   09/02/16 1605  BP: 112/68  Pulse: 100  Resp: 18  Temp: 98.2 F (36.8 C)  TempSrc: Oral  SpO2: 96%  Weight: 169 lb 3.2 oz (76.7 kg)  Height: 5\' 8"  (1.727 m)   Body mass index is 25.73 kg/m.  Physical Exam  Constitutional: She appears well-developed. No distress.  Frail appearing in NAD, lying in bed  Cardiovascular:  Pulses:      Radial pulses are 2+ on the right side, and 2+ on the left side.  Musculoskeletal: She exhibits edema and deformity. She exhibits no tenderness.  B/l hand/finger deformities with swelling in MCP, PIP and DIP  joints. No overt swelling in hand b/l. NT. Wrist with FROM and NT  Neurological: She is alert.  Skin: Skin is warm and dry. No rash noted.  Psychiatric: She has a normal mood and affect. Her behavior is normal.  Labs reviewed: Abstract on 08/21/2016  Component Date Value Ref Range Status  . Microalb, Ur 08/20/2016 1.6   Final  . Glucose 08/20/2016 116   Final  . BUN 08/20/2016 42* 4 - 21 Final  . Creatinine 08/20/2016 1.5* 0.5 - 1.1 Final  . Potassium 08/20/2016 4.4  3.4 - 5.3 Final  . Sodium 08/20/2016 145  137 - 147 Final  . Triglycerides 08/20/2016 83  40 - 160 Final  . Cholesterol 08/20/2016 142  0 - 200 Final  . HDL 08/20/2016 62  35 - 70 Final  . LDL Cholesterol 08/20/2016 63   Final  . ALT 08/20/2016 11  7 - 35 Final  . Hemoglobin A1C 08/20/2016 7.3   Final  . TSH 08/20/2016 2.06  0.41 - 5.90 Final  Nursing Home on 08/19/2016  Component Date Value Ref Range Status  . TSH 06/18/2016 4.63  0.41 - 5.90 Final  Nursing Home on 07/23/2016  Component Date Value Ref Range Status  . TSH 05/15/2016 71.63* 0.41 - 5.90 uIU/mL Final    No results found.   Assessment/Plan   ICD-10-CM   1. Swelling of left hand - likely 2/2 #2 M79.89   2. Arm DVT (deep venous thromboembolism), chronic, left (HCC) I82.722   3. Dementia with behavioral disturbance, unspecified dementia type F03.91   4. Pain in joint involving multiple sites M25.50      No further w/u at this time. Swelling likely related to OA +/- hx LUE DVT  Cont current meds as ordered  T/c PT if becomes painful  Will follow   Suly Vukelich S. Ancil Linsey  [facility] [address] [place], [place] [address] [phone] Cell (Monday-Friday 8 AM - 5 PM) [phone] After 5 PM and follow prompts

## 2016-09-09 ENCOUNTER — Non-Acute Institutional Stay (SKILLED_NURSING_FACILITY): Payer: Medicare Other

## 2016-09-09 DIAGNOSIS — Z0000 Encounter for general adult medical examination without abnormal findings: Secondary | ICD-10-CM | POA: Diagnosis not present

## 2016-09-09 NOTE — Progress Notes (Signed)
Subjective:   Cynthia [name] is a 83 y.o. female who presents for an Initial Medicare Annual Wellness Visit at [facility]; incapacitated patient unable to answer questions appropriately.    Objective:    Today's Vitals   09/09/16 1041  BP: 130/60  Pulse: 76  Temp: 97.3 F (36.3 C)  TempSrc: Oral  SpO2: 94%  Weight: 169 lb (76.7 kg)  Height: 5\' 8"  (1.727 m)   Body mass index is 25.7 kg/m.   Current Medications (verified) Outpatient Encounter Prescriptions as of 09/09/2016  Medication Sig  . acetaminophen (TYLENOL) 500 MG tablet Take 500 mg by mouth every 6 (six) hours as needed for mild pain, fever or headache (Give 1 tablet 3 times a day for pain).   Marland Kitchen. amLODipine (NORVASC) 10 MG tablet Take 10 mg by mouth daily.  Marland Kitchen. atorvastatin (LIPITOR) 20 MG tablet Take 20 mg by mouth daily.  Marland Kitchen. glipiZIDE (GLUCOTROL XL) 5 MG 24 hr tablet Take 0.5 mg by mouth daily.   . hydrochlorothiazide (HYDRODIURIL) 25 MG tablet Take 25 mg by mouth daily.  . insulin aspart (NOVOLOG) 100 UNIT/ML injection Inject 3 Units into the skin 3 (three) times daily before meals. Give before meals for blood sugar > 200.  Notify MD is Blood sugar is less than 70 or greater than 450.  Hold Insulin if less than 70  . insulin detemir (LEVEMIR) 100 UNIT/ML injection Inject 5 Units into the skin at bedtime.  Marland Kitchen. levothyroxine (SYNTHROID, LEVOTHROID) 137 MCG tablet Take 137 mcg by mouth daily before breakfast.  . linagliptin (TRADJENTA) 5 MG TABS tablet Take 1 tablet (5 mg total) by mouth daily.  Marland Kitchen. lisinopril (PRINIVIL,ZESTRIL) 10 MG tablet Take 10 mg by mouth daily.  Marland Kitchen. loperamide (IMODIUM) 2 MG capsule Take 2 mg by mouth every 3 (three) hours as needed for diarrhea or loose stools.  Marland Kitchen. LORazepam (ATIVAN) 0.5 MG tablet Take one tablet by mouth once daily; Take two tablets by mouth every evening for anxiety  . magnesium hydroxide (MILK OF MAGNESIA) 400 MG/5ML suspension Take 30 mLs by mouth at bedtime as needed  for mild constipation.  Marland Kitchen. NAMZARIC 28-10 MG CP24 Take 1 capsule by mouth daily.  Marland Kitchen. omeprazole (PRILOSEC) 20 MG capsule Take 20 mg by mouth daily.  . ondansetron (ZOFRAN-ODT) 8 MG disintegrating tablet Take 8 mg by mouth every 8 (eight) hours as needed for nausea or vomiting.  Marland Kitchen. QUEtiapine (SEROQUEL) 25 MG tablet Take 25 mg by mouth every morning.   Marland Kitchen. QUEtiapine (SEROQUEL) 50 MG tablet Take 50 mg by mouth at bedtime.   . sertraline (ZOLOFT) 100 MG tablet Take 100 mg by mouth daily.  . vitamin B-12 (CYANOCOBALAMIN) 1000 MCG tablet Take 1,000 mcg by mouth daily.  Carlena Hurl. XARELTO 20 MG TABS tablet Take 20 mg by mouth daily.   No facility-administered encounter medications on file as of 09/09/2016.     Allergies (verified) Patient has no known allergies.   History: Past Medical History:  Diagnosis Date  . CKD (chronic kidney disease)   . Dementia   . Diabetes mellitus without complication (HCC)   . Hypertension   . Hypothyroid   . Hypothyroid   . Thyroid disease    Past Surgical History:  Procedure Laterality Date  . ABDOMINAL HYSTERECTOMY    . FRACTURE SURGERY     History reviewed. No pertinent family history. Social History   Occupational History  . Not on file.   Social History Main Topics  .  Smoking status: Never Smoker  . Smokeless tobacco: Never Used  . Alcohol use No  . Drug use: No  . Sexual activity: Not Currently    Tobacco Counseling Counseling given: Not Answered   Activities of Daily Living In your present state of health, do you have any difficulty performing the following activities: 09/09/2016  Hearing? Y  Vision? Y  Difficulty concentrating or making decisions? Y  Walking or climbing stairs? Y  Dressing or bathing? Y  Doing errands, shopping? Y  Preparing Food and eating ? Y  Using the Toilet? Y  In the past six months, have you accidently leaked urine? Y  Do you have problems with loss of bowel control? Y  Managing your Medications? Y  Managing your  Finances? Y  Housekeeping or managing your Housekeeping? Y  Some recent data might be hidden    Immunizations and Health Maintenance Immunization History  Administered Date(s) Administered  . PPD Test 07/13/2016   There are no preventive care reminders to display for this patient.  Patient Care Team: Patient, No Pcp Per as PCP - General (General Practice)  Indicate any recent Medical Services you may have received from other than [facility] providers in the past year (date may be approximate).     Assessment:   This is a routine wellness examination for Seri.   Hearing/Vision screen No exam data present  Dietary issues and exercise activities discussed: Current Exercise Habits: The patient does not participate in regular exercise at present, Exercise limited by: neurologic condition(s)  Goals    None     Depression Screen PHQ 2/9 Scores 09/09/2016  Exception Documentation Medical reason    Fall Risk Fall Risk  09/09/2016  Falls in the past year? No    Cognitive Function:     6CIT Screen 09/09/2016  What Year? 4 points  What month? 0 points  What time? 3 points  Count back from 20 4 points  Months in reverse 4 points  Repeat phrase 10 points  Total Score 25    Screening Tests Health Maintenance  Topic Date Due  . INFLUENZA VACCINE  05/21/2017 (Originally 10/09/2016)  . FOOT EXAM  05/21/2017 (Originally 04/13/1937)  . OPHTHALMOLOGY EXAM  05/21/2017 (Originally 04/13/1937)  . DEXA SCAN  05/21/2017 (Originally 04/12/1994)  . TETANUS/TDAP  05/21/2017 (Originally 04/12/1946)  . PNA vac Low Risk Adult (1 of 2 - PCV13) 05/21/2017 (Originally 04/12/1994)  . HEMOGLOBIN A1C  02/19/2017      Plan:    I have personally reviewed and addressed the Medicare Annual Wellness questionnaire and have noted the following in the patient's chart:  A. Medical and social history B. Use of alcohol, tobacco or illicit drugs  C. Current medications and supplements D. Functional ability and  status E.  Nutritional status F.  Physical activity G. Advance directives H. List of other physicians I.  Hospitalizations, surgeries, and ER visits in previous 12 months J.  Vitals K. Screenings to include hearing, vision, cognitive, depression L. Referrals and appointments - none  Incapacitated patient, unable to verbally review preventative measures with her. A written personalized care plan for preventive services as well as general preventive health recommendations were provided to patient.  See attached scanned questionnaire for additional information.   Signed,   Annetta Maw, RN Nurse Health Advisor   Quick Notes   Health Maintenance: Foot exam, eye exam, DEXA, PNA 23, TDAP due     Abnormal Screen: 6 CIT 25     Patient Concerns: None  Nurse Concerns: None

## 2016-09-09 NOTE — Patient Instructions (Signed)
Written copy put in chart. To be scanned into EMR

## 2016-09-16 ENCOUNTER — Encounter: Payer: Self-pay | Admitting: Adult Health

## 2016-09-16 ENCOUNTER — Non-Acute Institutional Stay (SKILLED_NURSING_FACILITY): Payer: Medicare Other | Admitting: Adult Health

## 2016-09-16 DIAGNOSIS — E1122 Type 2 diabetes mellitus with diabetic chronic kidney disease: Secondary | ICD-10-CM | POA: Diagnosis not present

## 2016-09-16 DIAGNOSIS — I499 Cardiac arrhythmia, unspecified: Secondary | ICD-10-CM | POA: Diagnosis not present

## 2016-09-16 DIAGNOSIS — Z794 Long term (current) use of insulin: Secondary | ICD-10-CM | POA: Diagnosis not present

## 2016-09-16 DIAGNOSIS — E785 Hyperlipidemia, unspecified: Secondary | ICD-10-CM | POA: Diagnosis not present

## 2016-09-16 DIAGNOSIS — E1169 Type 2 diabetes mellitus with other specified complication: Secondary | ICD-10-CM

## 2016-09-16 DIAGNOSIS — N184 Chronic kidney disease, stage 4 (severe): Secondary | ICD-10-CM | POA: Diagnosis not present

## 2016-09-16 DIAGNOSIS — I10 Essential (primary) hypertension: Secondary | ICD-10-CM | POA: Diagnosis not present

## 2016-09-16 DIAGNOSIS — E1159 Type 2 diabetes mellitus with other circulatory complications: Secondary | ICD-10-CM

## 2016-09-16 DIAGNOSIS — F0391 Unspecified dementia with behavioral disturbance: Secondary | ICD-10-CM | POA: Diagnosis not present

## 2016-09-16 DIAGNOSIS — E034 Atrophy of thyroid (acquired): Secondary | ICD-10-CM | POA: Diagnosis not present

## 2016-09-16 NOTE — Progress Notes (Signed)
Location:   [facility] Room Number: 202 B Place of Service:  SNF (31)   CODE STATUS: DNR  No Known Allergies  Chief Complaint  Patient presents with  . Medical Management of Chronic Issues    1 month follow up    HPI:  She is a 83 year old resident of this facility being seen for the management of her chronic illnesses. There is little change in her status. She is unable to fully participate in the hpi or ros. There are no nursing concerns at this time.   Past Medical History:  Diagnosis Date  . CKD (chronic kidney disease)   . Dementia   . Diabetes mellitus without complication (HCC)   . Hypertension   . Hypothyroid   . Hypothyroid   . Thyroid disease     Past Surgical History:  Procedure Laterality Date  . ABDOMINAL HYSTERECTOMY    . FRACTURE SURGERY      Social History   Social History  . Marital status: Widowed    Spouse name: N/A  . Number of children: N/A  . Years of education: N/A   Occupational History  . Not on file.   Social History Main Topics  . Smoking status: Never Smoker  . Smokeless tobacco: Never Used  . Alcohol use No  . Drug use: No  . Sexual activity: Not Currently   Other Topics Concern  . Not on file   Social History Narrative  . No narrative on file   History reviewed. No pertinent family history.    VITAL SIGNS BP 132/88   Pulse (!) 50   Temp (!) 97 F (36.1 C)   Resp 17   Ht 5\' 3"  (1.6 m)   Wt 161 lb 14.4 oz (73.4 kg)   SpO2 97%   BMI 28.68 kg/m   Patient's Medications  New Prescriptions   No medications on file  Previous Medications   ACETAMINOPHEN (TYLENOL) 500 MG TABLET    Take 500 mg by mouth every 6 (six) hours as needed for mild pain, fever or headache (Give 1 tablet 3 times a day for pain).    AMLODIPINE (NORVASC) 10 MG TABLET    Take 10 mg by mouth daily.   ATORVASTATIN (LIPITOR) 20 MG TABLET    Take 20 mg by mouth daily.   GLIPIZIDE (GLUCOTROL XL) 2.5 MG 24 HR TABLET    Take 2.5 mg by  mouth daily with breakfast.   HYDROCHLOROTHIAZIDE (HYDRODIURIL) 25 MG TABLET    Take 25 mg by mouth daily.   INSULIN ASPART (NOVOLOG) 100 UNIT/ML INJECTION    Inject 3 Units into the skin 3 (three) times daily before meals. Give before meals for blood sugar > 200.  Notify MD is Blood sugar is less than 70 or greater than 450.  Hold Insulin if less than 70   INSULIN DETEMIR (LEVEMIR) 100 UNIT/ML INJECTION    Inject 5 Units into the skin at bedtime.   LEVOTHYROXINE (SYNTHROID, LEVOTHROID) 137 MCG TABLET    Take 137 mcg by mouth daily before breakfast.   LINAGLIPTIN (TRADJENTA) 5 MG TABS TABLET    Take 1 tablet (5 mg total) by mouth daily.   LISINOPRIL (PRINIVIL,ZESTRIL) 10 MG TABLET    Take 10 mg by mouth daily.   LOPERAMIDE (IMODIUM) 2 MG CAPSULE    Take 2 mg by mouth every 3 (three) hours as needed for diarrhea or loose stools.   LORAZEPAM (ATIVAN) 0.5 MG TABLET  Take one tablet by mouth once daily; Take two tablets by mouth every evening for anxiety   MAGNESIUM HYDROXIDE (MILK OF MAGNESIA) 400 MG/5ML SUSPENSION    Take 30 mLs by mouth at bedtime as needed for mild constipation.   NAMZARIC 28-10 MG CP24    Take 1 capsule by mouth daily.   OMEPRAZOLE (PRILOSEC) 20 MG CAPSULE    Take 20 mg by mouth daily.   ONDANSETRON (ZOFRAN-ODT) 8 MG DISINTEGRATING TABLET    Take 8 mg by mouth every 8 (eight) hours as needed for nausea or vomiting.   QUETIAPINE (SEROQUEL) 25 MG TABLET    Take 25 mg by mouth every morning.    QUETIAPINE (SEROQUEL) 50 MG TABLET    Take 50 mg by mouth at bedtime.    SERTRALINE (ZOLOFT) 100 MG TABLET    Take 100 mg by mouth daily.   VITAMIN B-12 (CYANOCOBALAMIN) 1000 MCG TABLET    Take 1,000 mcg by mouth daily.   XARELTO 20 MG TABS TABLET    Take 20 mg by mouth daily.  Modified Medications   No medications on file  Discontinued Medications   GLIPIZIDE (GLUCOTROL XL) 5 MG 24 HR TABLET    Take 0.5 mg by mouth daily.      SIGNIFICANT DIAGNOSTIC EXAMS  LABS REVIEWED:    03-01-16: wbc 5.3; hgb 13.1; hct 40.4; mcv 88.8; plt 228; glucose 211; bun 29; creat 1.84; k+ 4.3; na++ 138; tsh 30.991  04-15-16: wbc 4.9; hgb 12.7; hct 40.1; mcv 92.3; plt 200; glucose 169; bun 23.6; creat 1.21; k+ 4.4; na++ 141; liver normal albumin 3.8; hgb a1c 8.2 04-16-16: glucose 171; bun 19.3; creat 1.35; k+ 4.8; na++ 140; liver normal albumin 3.6; chol 190; ldl 112; trig 109; hdl 56 tsh 15.82; free T4: 0.63; hgb a1c 8.0  05-15-16: tsh 71.63 free T 4: 0.33 06-18-16: tsh 4.63; free T 4: 1.20 free T 3: 1.7 08-20-16: glucose 116; bun 41.9; creat 1.50; k+ 4.4; na++ 145; ca 9.1 tsh 2.06 ast 11; hgb a1c 7.3; chol 142; ldl 63; trig 83; hdl 62; urine micro-albumin 1.6   Review of Systems  Unable to perform ROS: Dementia    Constitutional: She appears well-developed.   Mouth/Throat: Oropharynx is clear and moist. No oropharyngeal exudate.  Eyes:  No scleral icterus.  Neck: Neck supple. Carotid bruit is not present. No tracheal deviation present. No thyromegaly present.  Cardiovascular: Normal rate, regular rhythm and intact distal pulses.  Exam reveals no gallop and no friction rub.   Murmur (1/6 SEM) heard.  Pulmonary/Chest: Effort normal and breath sounds normal.  No respiratory distress. She has no wheezes.  Abdominal: Soft. Bowel sounds are normal.  There is no hepatomegaly. There is no tenderness.  Musculoskeletal: no lower extremity edema   Lymphadenopathy:    She has no cervical adenopathy.  Neurological: She is alert.  Skin: Skin is warm and dry. No rash noted.  Psychiatric: She has a normal mood and affect.    ASSESSMENT/ PLAN:  1. Diabetes hgb a1c 7.3( previous  8.0) ; will continue tradjenta 5 mg daily; levemir 5 units nightly  novolog  3 units for cbg >200 will stop glucotrol   is on ace and statin   2. Hypothyroidism: tsh is 2.06  Will continue synthroid 137 mcg at this time.    3. Dyslipidemia; ldl 63; will continue lipitor 20 mg daily   4. Hypertension: b/p  132/88  will  continue norvasc 10 mg daily and lisinopril 10  mg daily and hctz 25 mg daily   5. Cardiac arrhythmia: has a history of left arm dvt: heart rate is stable; will continue xarelto 20 mg daily   6. Genella Rife: will continue prilosec 20 mg daily   7. Dementia: her current weight is 161 pounds; will continue namzaric 28-10 mg daily  8. Depression with anxiety: will continue zoloft 100 mg daily and is taking ativan 0.5 mg in the AM and 1 mg in the PM for anxiety.   9. Psychosis: is on seroquel 50 mg nightly and will monitor   10. Stage IV CKD: bun 41.9; creat 1.50 will monitor    MD is aware of resident's narcotic use and is in agreement with current plan of care. We will attempt to wean resident as apropriate     Synthia Innocent NP [facility]  Contact [phone] Monday through Friday 8am- 5pm  After hours call [phone]

## 2016-10-14 ENCOUNTER — Encounter: Payer: Self-pay | Admitting: Adult Health

## 2016-10-14 ENCOUNTER — Non-Acute Institutional Stay (SKILLED_NURSING_FACILITY): Payer: Medicare Other | Admitting: Adult Health

## 2016-10-14 DIAGNOSIS — I10 Essential (primary) hypertension: Secondary | ICD-10-CM | POA: Diagnosis not present

## 2016-10-14 DIAGNOSIS — E1159 Type 2 diabetes mellitus with other circulatory complications: Secondary | ICD-10-CM | POA: Diagnosis not present

## 2016-10-14 DIAGNOSIS — E1122 Type 2 diabetes mellitus with diabetic chronic kidney disease: Secondary | ICD-10-CM | POA: Diagnosis not present

## 2016-10-14 DIAGNOSIS — E1169 Type 2 diabetes mellitus with other specified complication: Secondary | ICD-10-CM

## 2016-10-14 DIAGNOSIS — E034 Atrophy of thyroid (acquired): Secondary | ICD-10-CM

## 2016-10-14 DIAGNOSIS — E785 Hyperlipidemia, unspecified: Secondary | ICD-10-CM

## 2016-10-14 DIAGNOSIS — N184 Chronic kidney disease, stage 4 (severe): Secondary | ICD-10-CM | POA: Diagnosis not present

## 2016-10-14 DIAGNOSIS — Z794 Long term (current) use of insulin: Secondary | ICD-10-CM

## 2016-10-14 DIAGNOSIS — I152 Hypertension secondary to endocrine disorders: Secondary | ICD-10-CM

## 2016-10-14 NOTE — Progress Notes (Signed)
Location:   [facility] Room Number: 202 B Place of Service:  SNF (31)   CODE STATUS: DNR  No Known Allergies  Chief Complaint  Patient presents with  . Medical Management of Chronic Issues    1 month follow up    HPI:  She is an 83 year old resident of this facility being seen for the management of her chronic illnesses: diabetes: dyslipidemia: hypothyroidism; hypertension . There is little change in her status. She is unable to participate in the hpi or ros. She does get out of bed daily. She takes her meals in the dining room. There are no nursing concerns at this time.    Past Medical History:  Diagnosis Date  . CKD (chronic kidney disease)   . Dementia   . Diabetes mellitus without complication (HCC)   . Hypertension   . Hypothyroid   . Hypothyroid   . Thyroid disease     Past Surgical History:  Procedure Laterality Date  . ABDOMINAL HYSTERECTOMY    . FRACTURE SURGERY      Social History   Social History  . Marital status: Widowed    Spouse name: N/A  . Number of children: N/A  . Years of education: N/A   Occupational History  . Not on file.   Social History Main Topics  . Smoking status: Never Smoker  . Smokeless tobacco: Never Used  . Alcohol use No  . Drug use: No  . Sexual activity: Not Currently   Other Topics Concern  . Not on file   Social History Narrative  . No narrative on file   No family history on file.    VITAL SIGNS BP 137/78   Pulse 72   Temp 98.2 F (36.8 C)   Resp 18   Ht 5\' 3"  (1.6 m)   Wt 161 lb 14.4 oz (73.4 kg)   SpO2 96%   BMI 28.68 kg/m   Patient's Medications  New Prescriptions   No medications on file  Previous Medications   ACETAMINOPHEN (TYLENOL) 500 MG TABLET    Take 500 mg by mouth every 6 (six) hours as needed for mild pain, fever or headache (Give 1 tablet 3 times a day for pain).    AMLODIPINE (NORVASC) 10 MG TABLET    Take 10 mg by mouth daily.   ATORVASTATIN (LIPITOR) 20 MG TABLET     Take 20 mg by mouth daily.   GLIPIZIDE (GLUCOTROL XL) 5 MG 24 HR TABLET    Give 0.5 tablet by mouth daily   HYDROCHLOROTHIAZIDE (HYDRODIURIL) 25 MG TABLET    Take 25 mg by mouth daily.   INSULIN ASPART (NOVOLOG) 100 UNIT/ML INJECTION    Inject 3 Units into the skin 3 (three) times daily before meals. Give before meals for blood sugar > 200.  Notify MD is Blood sugar is less than 70 or greater than 450.  Hold Insulin if less than 70   INSULIN DETEMIR (LEVEMIR) 100 UNIT/ML INJECTION    Inject 5 Units into the skin at bedtime.   LEVOTHYROXINE (SYNTHROID, LEVOTHROID) 137 MCG TABLET    Take 137 mcg by mouth daily before breakfast.   LINAGLIPTIN (TRADJENTA) 5 MG TABS TABLET    Take 1 tablet (5 mg total) by mouth daily.   LISINOPRIL (PRINIVIL,ZESTRIL) 10 MG TABLET    Take 10 mg by mouth daily.   LOPERAMIDE (IMODIUM) 2 MG CAPSULE    Take 2 mg by mouth every 3 (three) hours as needed  for diarrhea or loose stools.   LORAZEPAM (ATIVAN) 0.5 MG TABLET    Take one tablet by mouth once daily; Take two tablets by mouth every evening for anxiety   MAGNESIUM HYDROXIDE (MILK OF MAGNESIA) 400 MG/5ML SUSPENSION    Take 30 mLs by mouth at bedtime as needed for mild constipation.   NAMZARIC 28-10 MG CP24    Take 1 capsule by mouth daily.   OMEPRAZOLE (PRILOSEC) 20 MG CAPSULE    Take 20 mg by mouth daily.   ONDANSETRON (ZOFRAN-ODT) 8 MG DISINTEGRATING TABLET    Take 8 mg by mouth every 8 (eight) hours as needed for nausea or vomiting.   QUETIAPINE (SEROQUEL) 25 MG TABLET    Take 25 mg by mouth every morning.    QUETIAPINE (SEROQUEL) 50 MG TABLET    Take 50 mg by mouth at bedtime.    SERTRALINE (ZOLOFT) 100 MG TABLET    Take 100 mg by mouth daily.   VITAMIN B-12 (CYANOCOBALAMIN) 1000 MCG TABLET    Take 1,000 mcg by mouth daily.   XARELTO 20 MG TABS TABLET    Take 20 mg by mouth daily.  Modified Medications   No medications on file  Discontinued Medications   No medications on file     SIGNIFICANT DIAGNOSTIC  EXAMS  NO NEW EXAMS   LABS REVIEWED: PREVIOUS   03-01-16: wbc 5.3; hgb 13.1; hct 40.4; mcv 88.8; plt 228; glucose 211; bun 29; creat 1.84; k+ 4.3; na++ 138; tsh 30.991  04-15-16: wbc 4.9; hgb 12.7; hct 40.1; mcv 92.3; plt 200; glucose 169; bun 23.6; creat 1.21; k+ 4.4; na++ 141; liver normal albumin 3.8; hgb a1c 8.2 04-16-16: glucose 171; bun 19.3; creat 1.35; k+ 4.8; na++ 140; liver normal albumin 3.6; chol 190; ldl 112; trig 109; hdl 56 tsh 15.82; free T4: 0.63; hgb a1c 8.0  05-15-16: tsh 71.63 free T 4: 0.33 06-18-16: tsh 4.63; free T 4: 1.20 free T 3: 1.7 08-20-16: glucose 116; bun 41.9; creat 1.50; k+ 4.4; na++ 145; ca 9.1 tsh 2.06 ast 11; hgb a1c 7.3; chol 142; ldl 63; trig 83; hdl 62; urine micro-albumin 1.6   NO NEW LABS   Review of Systems  Unable to perform ROS: Dementia (unable to answer questions )   Physical Exam  Constitutional: She appears well-developed and well-nourished. No distress.  Eyes: Conjunctivae are normal.  Neck: Neck supple. No JVD present. No thyromegaly present.  Cardiovascular: Normal rate, regular rhythm and intact distal pulses.   Murmur heard. 1/6  Respiratory: Effort normal and breath sounds normal. No respiratory distress. She has no wheezes.  GI: Soft. Bowel sounds are normal. She exhibits no distension. There is no tenderness.  Musculoskeletal: She exhibits no edema.  Able to move all extremities   Lymphadenopathy:    She has no cervical adenopathy.  Neurological: She is alert.  Skin: Skin is warm and dry. She is not diaphoretic.  Psychiatric: She has a normal mood and affect.   ASSESSMENT/ PLAN:  TODAY:   1. Diabetes  Is stable hgb a1c 7.3( previous  8.0) ; will continue tradjenta 5 mg daily; levemir 5 units nightly  novolog  3 units for cbg >200 will stop glucotrol   is on ace and statin   2. Hypothyroidism: is stable  tsh is 2.06  Will continue synthroid 137 mcg at this time.    3. Dyslipidemia; is stable  ldl 63; will continue lipitor 20 mg  daily   4. Hypertension:  Stable  b/p  137/78  will continue norvasc 10 mg daily and lisinopril 10 mg daily and hctz 25 mg daily  PREVIOUS:    5. Cardiac arrhythmia: stable  has a history of left arm dvt: heart rate is stable; will continue xarelto 20 mg daily   6. Gerd: stable  will continue prilosec 20 mg daily   7. Dementia: no change:  her current weight is 161 pounds; will continue namzaric 28-10 mg daily  8. Depression with anxiety: stable  will continue zoloft 100 mg daily and is taking ativan 0.5 mg in the AM and 1 mg in the PM for anxiety.   9. Psychosis no change in status : is on seroquel 50 mg nightly and will monitor   10. Stage IV CKD: unchanged:  bun 41.9; creat 1.50 will monitor    MD is aware of resident's narcotic use and is in agreement with current plan of care. We will attempt to wean resident as apropriate     Synthia Innocenteborah Nicholaos Schippers NP [facility]  Contact [phone] Monday through Friday 8am- 5pm  After hours call [phone]

## 2016-10-16 ENCOUNTER — Non-Acute Institutional Stay (SKILLED_NURSING_FACILITY): Payer: Medicare Other | Admitting: Adult Health

## 2016-10-16 DIAGNOSIS — F01518 Vascular dementia, unspecified severity, with other behavioral disturbance: Secondary | ICD-10-CM

## 2016-10-16 DIAGNOSIS — N184 Chronic kidney disease, stage 4 (severe): Secondary | ICD-10-CM

## 2016-10-16 DIAGNOSIS — R634 Abnormal weight loss: Secondary | ICD-10-CM | POA: Diagnosis not present

## 2016-10-16 DIAGNOSIS — Z794 Long term (current) use of insulin: Secondary | ICD-10-CM

## 2016-10-16 DIAGNOSIS — E1169 Type 2 diabetes mellitus with other specified complication: Secondary | ICD-10-CM | POA: Diagnosis not present

## 2016-10-16 DIAGNOSIS — E1122 Type 2 diabetes mellitus with diabetic chronic kidney disease: Secondary | ICD-10-CM

## 2016-10-16 DIAGNOSIS — E785 Hyperlipidemia, unspecified: Secondary | ICD-10-CM | POA: Diagnosis not present

## 2016-10-16 DIAGNOSIS — F0151 Vascular dementia with behavioral disturbance: Secondary | ICD-10-CM | POA: Diagnosis not present

## 2016-10-17 ENCOUNTER — Other Ambulatory Visit: Payer: Self-pay

## 2016-10-17 ENCOUNTER — Encounter: Payer: Self-pay | Admitting: Adult Health

## 2016-10-17 MED ORDER — LORAZEPAM 0.5 MG PO TABS: ORAL_TABLET | ORAL | 0 refills | Status: DC

## 2016-10-17 MED ORDER — LORAZEPAM 0.5 MG PO TABS: 0.5000 mg | ORAL_TABLET | Freq: Every day | ORAL | 0 refills | Status: DC

## 2016-10-17 NOTE — Telephone Encounter (Signed)
RX faxed to [facility] @ [phone], phone number [phone] 

## 2016-10-17 NOTE — Progress Notes (Signed)
Location:   [facility] Room Number: 202 B Place of Service:   SNF (31)   CODE STATUS:  DNR  No Known Allergies  Chief Complaint  Patient presents with  . Acute Visit    Weight Loss    HPI:  I have been asked to see for her slow progressive weight loss. Her weight in May was 167 pounds her current weight is 160 pounds. Staff reports that she does have a good appetite. She is unable to participate in the hpi or ros. Her medications could be contributing to her weight loss.    Past Medical History:  Diagnosis Date  . CKD (chronic kidney disease)   . Dementia   . Diabetes mellitus without complication (HCC)   . Hypertension   . Hypothyroid   . Hypothyroid   . Thyroid disease     Past Surgical History:  Procedure Laterality Date  . ABDOMINAL HYSTERECTOMY    . FRACTURE SURGERY      Social History   Social History  . Marital status: Widowed    Spouse name: N/A  . Number of children: N/A  . Years of education: N/A   Occupational History  . Not on file.   Social History Main Topics  . Smoking status: Never Smoker  . Smokeless tobacco: Never Used  . Alcohol use No  . Drug use: No  . Sexual activity: Not Currently   Other Topics Concern  . Not on file   Social History Narrative  . No narrative on file   History reviewed. No pertinent family history.    VITAL SIGNS BP 140/90   Pulse 78   Temp 97.9 F (36.6 C)   Resp 16   Ht 5\' 3"  (1.6 m)   Wt 160 lb (72.6 kg)   SpO2 99%   BMI 28.34 kg/m   Patient's Medications  New Prescriptions   No medications on file  Previous Medications   ACETAMINOPHEN (TYLENOL) 500 MG TABLET    Take 500 mg by mouth every 6 (six) hours as needed for mild pain, fever or headache (Give 1 tablet 3 times a day for pain).    AMLODIPINE (NORVASC) 10 MG TABLET    Take 10 mg by mouth daily.   ATORVASTATIN (LIPITOR) 20 MG TABLET    Take 20 mg by mouth daily.   GLIPIZIDE (GLUCOTROL XL) 5 MG 24 HR TABLET    Give 0.5  tablet by mouth daily   HYDROCHLOROTHIAZIDE (HYDRODIURIL) 25 MG TABLET    Take 25 mg by mouth daily.   INSULIN ASPART (NOVOLOG) 100 UNIT/ML INJECTION    Inject 3 Units into the skin 3 (three) times daily before meals. Give before meals for blood sugar > 200.  Notify MD is Blood sugar is less than 70 or greater than 450.  Hold Insulin if less than 70   INSULIN DETEMIR (LEVEMIR) 100 UNIT/ML INJECTION    Inject 5 Units into the skin at bedtime.   LEVOTHYROXINE (SYNTHROID, LEVOTHROID) 137 MCG TABLET    Take 137 mcg by mouth daily before breakfast.   LINAGLIPTIN (TRADJENTA) 5 MG TABS TABLET    Take 1 tablet (5 mg total) by mouth daily.   LISINOPRIL (PRINIVIL,ZESTRIL) 10 MG TABLET    Take 10 mg by mouth daily.   LOPERAMIDE (IMODIUM) 2 MG CAPSULE    Take 2 mg by mouth every 3 (three) hours as needed for diarrhea or loose stools.   LORAZEPAM (ATIVAN) 0.5 MG TABLET  Take one tablet by mouth once daily; Take two tablets by mouth every evening for anxiety   MAGNESIUM HYDROXIDE (MILK OF MAGNESIA) 400 MG/5ML SUSPENSION    Take 30 mLs by mouth at bedtime as needed for mild constipation.   NAMZARIC 28-10 MG CP24    Take 1 capsule by mouth daily.   OMEPRAZOLE (PRILOSEC) 20 MG CAPSULE    Take 20 mg by mouth daily.   ONDANSETRON (ZOFRAN-ODT) 8 MG DISINTEGRATING TABLET    Take 8 mg by mouth every 8 (eight) hours as needed for nausea or vomiting.   QUETIAPINE (SEROQUEL) 25 MG TABLET    Take 25 mg by mouth every morning.    QUETIAPINE (SEROQUEL) 50 MG TABLET    Take 50 mg by mouth at bedtime.    SERTRALINE (ZOLOFT) 100 MG TABLET    Take 100 mg by mouth daily.   VITAMIN B-12 (CYANOCOBALAMIN) 1000 MCG TABLET    Take 1,000 mcg by mouth daily.   XARELTO 20 MG TABS TABLET    Take 20 mg by mouth daily.  Modified Medications   No medications on file  Discontinued Medications   No medications on file     SIGNIFICANT DIAGNOSTIC EXAMS   NO NEW EXAMS   LABS REVIEWED: PREVIOUS    03-01-16: wbc 5.3; hgb 13.1; hct  40.4; mcv 88.8; plt 228; glucose 211; bun 29; creat 1.84; k+ 4.3; na++ 138; tsh 30.991  04-15-16: wbc 4.9; hgb 12.7; hct 40.1; mcv 92.3; plt 200; glucose 169; bun 23.6; creat 1.21; k+ 4.4; na++ 141; liver normal albumin 3.8; hgb a1c 8.2 04-16-16: glucose 171; bun 19.3; creat 1.35; k+ 4.8; na++ 140; liver normal albumin 3.6; chol 190; ldl 112; trig 109; hdl 56 tsh 15.82; free T4: 0.63; hgb a1c 8.0  05-15-16: tsh 71.63 free T 4: 0.33 06-18-16: tsh 4.63; free T 4: 1.20 free T 3: 1.7 08-20-16: glucose 116; bun 41.9; creat 1.50; k+ 4.4; na++ 145; ca 9.1 tsh 2.06 ast 11; hgb a1c 7.3; chol 142; ldl 63; trig 83; hdl 62; urine micro-albumin 1.6   NO NEW LABS   Review of Systems  Unable to perform ROS: Dementia (unable to answer questions )    Physical Exam  Constitutional: She appears well-developed. No distress.  Eyes: Conjunctivae are normal.  Neck: Neck supple. No JVD present. No thyromegaly present.  Cardiovascular: Normal rate, regular rhythm and intact distal pulses.   Murmur heard. 1/6  Respiratory: Effort normal and breath sounds normal. No respiratory distress. She has no wheezes.  GI: Soft. Bowel sounds are normal. She exhibits no distension. There is no tenderness.  Musculoskeletal: She exhibits no edema.  Able to move all extremities   Lymphadenopathy:    She has no cervical adenopathy.  Neurological: She is alert.  Skin: Skin is warm and dry. She is not diaphoretic.  Psychiatric: She has a normal mood and affect.     ASSESSMENT/ PLAN:  TODAY:   1. Weight loss 2. Dementia 3. Diabetes 4. Dyslipidemia:   Will stop both her Lipitor and glipizide due to her weight loss we will begin with this plan if she continues to loose weight will make further adjustments.     MD is aware of resident's narcotic use and is in agreement with current plan of care. We will attempt to wean resident as apropriate     Synthia Innocenteborah Green NP [facility]  Contact [phone] Monday through  Friday 8am- 5pm  After hours call [phone]

## 2016-10-21 ENCOUNTER — Non-Acute Institutional Stay (SKILLED_NURSING_FACILITY): Payer: Medicare Other | Admitting: Adult Health

## 2016-10-21 ENCOUNTER — Inpatient Hospital Stay (HOSPITAL_COMMUNITY)
Admission: EM | Admit: 2016-10-21 | Discharge: 2016-10-23 | DRG: 605 | Disposition: A | Discharge status: Skilled Nursing Facility | Payer: Medicare Other | Attending: Internal Medicine | Admitting: Internal Medicine

## 2016-10-21 ENCOUNTER — Encounter: Payer: Self-pay | Admitting: Adult Health

## 2016-10-21 ENCOUNTER — Encounter (HOSPITAL_COMMUNITY): Payer: Self-pay | Admitting: Emergency Medicine

## 2016-10-21 DIAGNOSIS — E870 Hyperosmolality and hypernatremia: Secondary | ICD-10-CM | POA: Diagnosis present

## 2016-10-21 DIAGNOSIS — Z7901 Long term (current) use of anticoagulants: Secondary | ICD-10-CM

## 2016-10-21 DIAGNOSIS — L0291 Cutaneous abscess, unspecified: Secondary | ICD-10-CM | POA: Diagnosis not present

## 2016-10-21 DIAGNOSIS — I152 Hypertension secondary to endocrine disorders: Secondary | ICD-10-CM | POA: Diagnosis present

## 2016-10-21 DIAGNOSIS — N184 Chronic kidney disease, stage 4 (severe): Secondary | ICD-10-CM | POA: Diagnosis present

## 2016-10-21 DIAGNOSIS — F418 Other specified anxiety disorders: Secondary | ICD-10-CM | POA: Diagnosis present

## 2016-10-21 DIAGNOSIS — R634 Abnormal weight loss: Secondary | ICD-10-CM | POA: Insufficient documentation

## 2016-10-21 DIAGNOSIS — I129 Hypertensive chronic kidney disease with stage 1 through stage 4 chronic kidney disease, or unspecified chronic kidney disease: Secondary | ICD-10-CM | POA: Diagnosis present

## 2016-10-21 DIAGNOSIS — E1169 Type 2 diabetes mellitus with other specified complication: Secondary | ICD-10-CM | POA: Diagnosis present

## 2016-10-21 DIAGNOSIS — Z7401 Bed confinement status: Secondary | ICD-10-CM

## 2016-10-21 DIAGNOSIS — S40022A Contusion of left upper arm, initial encounter: Secondary | ICD-10-CM | POA: Diagnosis not present

## 2016-10-21 DIAGNOSIS — I10 Essential (primary) hypertension: Secondary | ICD-10-CM | POA: Diagnosis not present

## 2016-10-21 DIAGNOSIS — E1122 Type 2 diabetes mellitus with diabetic chronic kidney disease: Secondary | ICD-10-CM | POA: Diagnosis present

## 2016-10-21 DIAGNOSIS — E034 Atrophy of thyroid (acquired): Secondary | ICD-10-CM | POA: Diagnosis present

## 2016-10-21 DIAGNOSIS — E871 Hypo-osmolality and hyponatremia: Secondary | ICD-10-CM | POA: Diagnosis present

## 2016-10-21 DIAGNOSIS — E785 Hyperlipidemia, unspecified: Secondary | ICD-10-CM | POA: Diagnosis present

## 2016-10-21 DIAGNOSIS — X58XXXA Exposure to other specified factors, initial encounter: Secondary | ICD-10-CM | POA: Diagnosis present

## 2016-10-21 DIAGNOSIS — R229 Localized swelling, mass and lump, unspecified: Secondary | ICD-10-CM | POA: Diagnosis not present

## 2016-10-21 DIAGNOSIS — E860 Dehydration: Secondary | ICD-10-CM | POA: Diagnosis present

## 2016-10-21 DIAGNOSIS — L02414 Cutaneous abscess of left upper limb: Secondary | ICD-10-CM | POA: Diagnosis not present

## 2016-10-21 DIAGNOSIS — E1159 Type 2 diabetes mellitus with other circulatory complications: Secondary | ICD-10-CM | POA: Diagnosis not present

## 2016-10-21 DIAGNOSIS — Z90710 Acquired absence of both cervix and uterus: Secondary | ICD-10-CM

## 2016-10-21 DIAGNOSIS — F03918 Unspecified dementia, unspecified severity, with other behavioral disturbance: Secondary | ICD-10-CM | POA: Diagnosis present

## 2016-10-21 DIAGNOSIS — Z66 Do not resuscitate: Secondary | ICD-10-CM | POA: Diagnosis present

## 2016-10-21 DIAGNOSIS — Z794 Long term (current) use of insulin: Secondary | ICD-10-CM | POA: Diagnosis not present

## 2016-10-21 DIAGNOSIS — F0391 Unspecified dementia with behavioral disturbance: Secondary | ICD-10-CM | POA: Diagnosis present

## 2016-10-21 DIAGNOSIS — Z86718 Personal history of other venous thrombosis and embolism: Secondary | ICD-10-CM

## 2016-10-21 DIAGNOSIS — F0151 Vascular dementia with behavioral disturbance: Secondary | ICD-10-CM | POA: Diagnosis not present

## 2016-10-21 DIAGNOSIS — N179 Acute kidney failure, unspecified: Secondary | ICD-10-CM | POA: Diagnosis present

## 2016-10-21 DIAGNOSIS — Z79899 Other long term (current) drug therapy: Secondary | ICD-10-CM

## 2016-10-21 LAB — CBC WITH DIFFERENTIAL/PLATELET
Basophils Absolute: 0 10*3/uL (ref 0.0–0.1)
Basophils Relative: 0 %
Eosinophils Absolute: 0.1 10*3/uL (ref 0.0–0.7)
Eosinophils Relative: 2 %
HCT: 31.8 % — ABNORMAL LOW (ref 36.0–46.0)
Hemoglobin: 10.1 g/dL — ABNORMAL LOW (ref 12.0–15.0)
Lymphocytes Relative: 23 %
Lymphs Abs: 1.4 10*3/uL (ref 0.7–4.0)
MCH: 28.4 pg (ref 26.0–34.0)
MCHC: 31.8 g/dL (ref 30.0–36.0)
MCV: 89.3 fL (ref 78.0–100.0)
Monocytes Absolute: 0.3 10*3/uL (ref 0.1–1.0)
Monocytes Relative: 5 %
Neutro Abs: 4.2 10*3/uL (ref 1.7–7.7)
Neutrophils Relative %: 70 %
Platelets: 296 10*3/uL (ref 150–400)
RBC: 3.56 MIL/uL — ABNORMAL LOW (ref 3.87–5.11)
RDW: 14.6 % (ref 11.5–15.5)
WBC: 6.1 10*3/uL (ref 4.0–10.5)

## 2016-10-21 LAB — GLUCOSE, CAPILLARY: GLUCOSE-CAPILLARY: 131 mg/dL — AB (ref 65–99)

## 2016-10-21 LAB — BASIC METABOLIC PANEL
Anion gap: 6 (ref 5–15)
BUN: 48 mg/dL — ABNORMAL HIGH (ref 6–20)
CO2: 24 mmol/L (ref 22–32)
Calcium: 9.4 mg/dL (ref 8.9–10.3)
Chloride: 120 mmol/L — ABNORMAL HIGH (ref 101–111)
Creatinine, Ser: 1.5 mg/dL — ABNORMAL HIGH (ref 0.44–1.00)
GFR calc Af Amer: 35 mL/min — ABNORMAL LOW (ref >60)
GFR calc non Af Amer: 30 mL/min — ABNORMAL LOW (ref >60)
Glucose, Bld: 245 mg/dL — ABNORMAL HIGH (ref 65–99)
Potassium: 5 mmol/L (ref 3.5–5.1)
Sodium: 150 mmol/L — ABNORMAL HIGH (ref 135–145)

## 2016-10-21 LAB — MRSA PCR SCREENING: MRSA by PCR: NEGATIVE

## 2016-10-21 LAB — I-STAT CG4 LACTIC ACID, ED: Lactic Acid, Venous: 1.43 mmol/L (ref 0.5–1.9)

## 2016-10-21 MED ORDER — SERTRALINE HCL 100 MG PO TABS
100.0000 mg | ORAL_TABLET | Freq: Every day | ORAL | Status: DC
  Administered 2016-10-22 – 2016-10-23 (×2): 100 mg via ORAL
  Filled 2016-10-21 (×2): qty 1

## 2016-10-21 MED ORDER — INSULIN ASPART 100 UNIT/ML ~~LOC~~ SOLN
0.0000 [IU] | Freq: Three times a day (TID) | SUBCUTANEOUS | Status: DC
  Administered 2016-10-22: 2 [IU] via SUBCUTANEOUS
  Administered 2016-10-22: 3 [IU] via SUBCUTANEOUS
  Administered 2016-10-23: 2 [IU] via SUBCUTANEOUS

## 2016-10-21 MED ORDER — AMLODIPINE BESYLATE 5 MG PO TABS
5.0000 mg | ORAL_TABLET | Freq: Every day | ORAL | Status: DC
  Administered 2016-10-22 – 2016-10-23 (×2): 5 mg via ORAL
  Filled 2016-10-21 (×2): qty 1

## 2016-10-21 MED ORDER — MEMANTINE HCL-DONEPEZIL HCL ER 28-10 MG PO CP24: 1.0000 | ORAL_CAPSULE | Freq: Every day | ORAL | Status: DC

## 2016-10-21 MED ORDER — LEVOTHYROXINE SODIUM 25 MCG PO TABS
137.0000 ug | ORAL_TABLET | Freq: Every day | ORAL | Status: DC
  Administered 2016-10-23: 08:00:00 137 ug via ORAL
  Filled 2016-10-21: qty 1

## 2016-10-21 MED ORDER — QUETIAPINE FUMARATE 50 MG PO TABS
50.0000 mg | ORAL_TABLET | Freq: Every day | ORAL | Status: DC
  Administered 2016-10-21 – 2016-10-22 (×2): 50 mg via ORAL
  Filled 2016-10-21 (×2): qty 1

## 2016-10-21 MED ORDER — SODIUM CHLORIDE 0.9 % IV BOLUS (SEPSIS)
1000.0000 mL | Freq: Once | INTRAVENOUS | Status: AC
  Administered 2016-10-21: 1000 mL via INTRAVENOUS

## 2016-10-21 MED ORDER — MEMANTINE HCL ER 28 MG PO CP24
28.0000 mg | ORAL_CAPSULE | Freq: Every day | ORAL | Status: DC
  Administered 2016-10-22 – 2016-10-23 (×2): 28 mg via ORAL
  Filled 2016-10-21 (×2): qty 1

## 2016-10-21 MED ORDER — QUETIAPINE FUMARATE 25 MG PO TABS
25.0000 mg | ORAL_TABLET | Freq: Every day | ORAL | Status: DC
  Administered 2016-10-22 – 2016-10-23 (×2): 25 mg via ORAL
  Filled 2016-10-21 (×2): qty 1

## 2016-10-21 MED ORDER — INSULIN DETEMIR 100 UNIT/ML ~~LOC~~ SOLN
5.0000 [IU] | Freq: Every day | SUBCUTANEOUS | Status: DC
Start: 2016-10-21 — End: 2016-10-23
  Administered 2016-10-21 – 2016-10-22 (×2): 5 [IU] via SUBCUTANEOUS
  Filled 2016-10-21 (×2): qty 0.05

## 2016-10-21 MED ORDER — DONEPEZIL HCL 10 MG PO TABS
10.0000 mg | ORAL_TABLET | Freq: Every day | ORAL | Status: DC
  Administered 2016-10-22 – 2016-10-23 (×2): 10 mg via ORAL
  Filled 2016-10-21 (×2): qty 1

## 2016-10-21 MED ORDER — QUETIAPINE FUMARATE 25 MG PO TABS: 25.0000 mg | ORAL_TABLET | Freq: Two times a day (BID) | ORAL | Status: DC

## 2016-10-21 MED ORDER — ONDANSETRON 4 MG PO TBDP: 8.0000 mg | ORAL_TABLET | Freq: Three times a day (TID) | ORAL | Status: DC | PRN

## 2016-10-21 MED ORDER — LORAZEPAM 0.5 MG PO TABS
0.5000 mg | ORAL_TABLET | Freq: Every day | ORAL | Status: DC
  Administered 2016-10-21 – 2016-10-22 (×2): 0.5 mg via ORAL
  Filled 2016-10-21 (×2): qty 1

## 2016-10-21 MED ORDER — CLINDAMYCIN PHOSPHATE 600 MG/50ML IV SOLN
600.0000 mg | Freq: Three times a day (TID) | INTRAVENOUS | Status: DC
  Administered 2016-10-21 – 2016-10-23 (×5): 600 mg via INTRAVENOUS
  Filled 2016-10-21 (×5): qty 50

## 2016-10-21 NOTE — ED Triage Notes (Signed)
Per EMS, [facility], staff reports patient has mass to left chest increasing in size x4 days. Patient c/o pain.  A&Ox2. Baseline per staff. Hx dementia.  BP 138/74 HR 76 RR 14 O2 96%

## 2016-10-21 NOTE — Anesthesia Preprocedure Evaluation (Addendum)
Anesthesia Evaluation  Patient identified by MRN, date of birth, ID band Patient awake    Reviewed: Allergy & Precautions, NPO status , Patient's Chart, lab work & pertinent test results  Airway Mallampati: II  TM Distance: >3 FB Neck ROM: Full    Dental no notable dental hx. (+) Poor Dentition, Missing, Loose, Dental Advisory Given   Pulmonary neg pulmonary ROS,    Pulmonary exam normal breath sounds clear to auscultation       Cardiovascular hypertension, Pt. on medications Normal cardiovascular exam Rhythm:Regular Rate:Normal     Neuro/Psych PSYCHIATRIC DISORDERS Schizophrenia Dementia    GI/Hepatic negative GI ROS, Neg liver ROS,   Endo/Other  diabetes, Insulin DependentHypothyroidism   Renal/GU Renal disease     Musculoskeletal negative musculoskeletal ROS (+)   Abdominal   Peds  Hematology  (+) anemia ,   Anesthesia Other Findings   Reproductive/Obstetrics                           Anesthesia Physical Anesthesia Plan  ASA: III  Anesthesia Plan: General   Post-op Pain Management:    Induction: Intravenous  PONV Risk Score and Plan: 3 and Ondansetron, Dexamethasone and Treatment may vary due to age or medical condition  Airway Management Planned: LMA and Oral ETT  Additional Equipment:   Intra-op Plan:   Post-operative Plan: Extubation in OR  Informed Consent: I have reviewed the patients History and Physical, chart, labs and discussed the procedure including the risks, benefits and alternatives for the proposed anesthesia with the patient or authorized representative who has indicated his/her understanding and acceptance.   Dental advisory given  Plan Discussed with: CRNA  Anesthesia Plan Comments:        Anesthesia Quick Evaluation

## 2016-10-21 NOTE — Progress Notes (Signed)
Location:   [facility] Room Number: 202 B Place of Service:  SNF (31)   CODE STATUS: DNR  No Known Allergies  Chief Complaint  Patient presents with  . Acute Visit    hard area in left arm    HPI:  I have been asked by the nursing staff to look at her left axilla. Over the weekend, the nursing has found a hard warm to touch "growth" under her arm. She is unable to participate in the hpi or ros; but does have pain to palpation. There are no reports of fever present. I have asked Dr. Montez Morita who recommends she to go to the for her possible abscess; as this will require surgical intervention.   Past Medical History:  Diagnosis Date  . CKD (chronic kidney disease)   . Dementia   . Diabetes mellitus without complication (HCC)   . Hypertension   . Hypothyroid   . Hypothyroid   . Thyroid disease     Past Surgical History:  Procedure Laterality Date  . ABDOMINAL HYSTERECTOMY    . FRACTURE SURGERY      Social History   Social History  . Marital status: Widowed    Spouse name: N/A  . Number of children: N/A  . Years of education: N/A   Occupational History  . Not on file.   Social History Main Topics  . Smoking status: Never Smoker  . Smokeless tobacco: Never Used  . Alcohol use No  . Drug use: No  . Sexual activity: Not Currently   Other Topics Concern  . Not on file   Social History Narrative  . No narrative on file   History reviewed. No pertinent family history.    VITAL SIGNS BP 129/60   Pulse 89   Temp 98.2 F (36.8 C)   Resp 18   Ht 5\' 3"  (1.6 m)   Wt 160 lb (72.6 kg)   SpO2 96%   BMI 28.34 kg/m    Patient's Medications  New Prescriptions   No medications on file  Previous Medications   ACETAMINOPHEN (TYLENOL) 500 MG TABLET    Take 500 mg by mouth every 6 (six) hours as needed for mild pain, fever or headache (Give 1 tablet 3 times a day for pain).    AMLODIPINE (NORVASC) 10 MG TABLET    Take 10 mg by mouth daily.   HYDROCHLOROTHIAZIDE (HYDRODIURIL) 25 MG TABLET    Take 25 mg by mouth daily.   INSULIN ASPART (NOVOLOG) 100 UNIT/ML INJECTION    Inject 3 Units into the skin 3 (three) times daily before meals. Give before meals for blood sugar > 200.  Notify MD is Blood sugar is less than 70 or greater than 450.  Hold Insulin if less than 70   INSULIN DETEMIR (LEVEMIR) 100 UNIT/ML INJECTION    Inject 5 Units into the skin at bedtime.   LEVOTHYROXINE (SYNTHROID, LEVOTHROID) 137 MCG TABLET    Take 137 mcg by mouth daily before breakfast.   LINAGLIPTIN (TRADJENTA) 5 MG TABS TABLET    Take 1 tablet (5 mg total) by mouth daily.   LISINOPRIL (PRINIVIL,ZESTRIL) 10 MG TABLET    Take 10 mg by mouth daily.   LOPERAMIDE (IMODIUM) 2 MG CAPSULE    Take 2 mg by mouth every 3 (three) hours as needed for diarrhea or loose stools.   LORAZEPAM (ATIVAN) 0.5 MG TABLET    Take two tablets by mouth every evening for anxiety   LORAZEPAM (ATIVAN)  0.5 MG TABLET    Take 1 tablet (0.5 mg total) by mouth daily.   MAGNESIUM HYDROXIDE (MILK OF MAGNESIA) 400 MG/5ML SUSPENSION    Take 30 mLs by mouth at bedtime as needed for mild constipation.   NAMZARIC 28-10 MG CP24    Take 1 capsule by mouth daily.   OMEPRAZOLE (PRILOSEC) 20 MG CAPSULE    Take 20 mg by mouth daily.   ONDANSETRON (ZOFRAN-ODT) 8 MG DISINTEGRATING TABLET    Take 8 mg by mouth every 8 (eight) hours as needed for nausea or vomiting.   QUETIAPINE (SEROQUEL) 25 MG TABLET    Take 25 mg by mouth every morning.    QUETIAPINE (SEROQUEL) 50 MG TABLET    Take 50 mg by mouth at bedtime.    SERTRALINE (ZOLOFT) 100 MG TABLET    Take 100 mg by mouth daily.   VITAMIN B-12 (CYANOCOBALAMIN) 1000 MCG TABLET    Take 1,000 mcg by mouth daily.   XARELTO 20 MG TABS TABLET    Take 20 mg by mouth daily.  Modified Medications   No medications on file  Discontinued Medications   ATORVASTATIN (LIPITOR) 20 MG TABLET    Take 20 mg by mouth daily.   GLIPIZIDE (GLUCOTROL XL) 5 MG 24 HR TABLET    Give 0.5  tablet by mouth daily     SIGNIFICANT DIAGNOSTIC EXAMS    NO NEW EXAMS   LABS REVIEWED: PREVIOUS    03-01-16: wbc 5.3; hgb 13.1; hct 40.4; mcv 88.8; plt 228; glucose 211; bun 29; creat 1.84; k+ 4.3; na++ 138; tsh 30.991  04-15-16: wbc 4.9; hgb 12.7; hct 40.1; mcv 92.3; plt 200; glucose 169; bun 23.6; creat 1.21; k+ 4.4; na++ 141; liver normal albumin 3.8; hgb a1c 8.2 04-16-16: glucose 171; bun 19.3; creat 1.35; k+ 4.8; na++ 140; liver normal albumin 3.6; chol 190; ldl 112; trig 109; hdl 56 tsh 15.82; free T4: 0.63; hgb a1c 8.0  05-15-16: tsh 71.63 free T 4: 0.33 06-18-16: tsh 4.63; free T 4: 1.20 free T 3: 1.7 08-20-16: glucose 116; bun 41.9; creat 1.50; k+ 4.4; na++ 145; ca 9.1 tsh 2.06 ast 11; hgb a1c 7.3; chol 142; ldl 63; trig 83; hdl 62; urine micro-albumin 1.6   NO NEW LABS   Review of Systems  Unable to perform ROS: Dementia (unable to answer questions )    Physical Exam  Constitutional: She appears well-developed and well-nourished. No distress.  Eyes: Conjunctivae are normal.  Neck: Neck supple. No JVD present. No thyromegaly present.  Cardiovascular: Normal rate, regular rhythm and intact distal pulses.   Murmur heard. 1/6   Respiratory: Effort normal and breath sounds normal. No respiratory distress. She has no wheezes.  GI: Soft. Bowel sounds are normal. She exhibits no distension. There is no tenderness.  Musculoskeletal: She exhibits no edema.  Able to move all extremities   Lymphadenopathy:    She has no cervical adenopathy.  Neurological: She is alert.  Skin: Skin is warm and dry. She is not diaphoretic.  Her left axilla is hard and painful to palpation. There is heat present   Psychiatric: She has a normal mood and affect.     ASSESSMENT/ PLAN:  TODAY   1. Left axilla abscess:   Will send her to the ED for further interventions, the concern is for developing sepsis.     MD is aware of resident's narcotic use and is in agreement with current plan of  care. We will attempt to wean  resident as apropriate   Synthia Innocenteborah Green NP [facility]  Contact [phone] Monday through Friday 8am- 5pm  After hours call [phone]

## 2016-10-21 NOTE — ED Notes (Signed)
Call report to [facility] anytime at [phone].

## 2016-10-21 NOTE — ED Provider Notes (Signed)
Medical screening examination/treatment/procedure(s) were conducted as a shared visit with non-physician practitioner(s) and myself.  I personally evaluated the patient during the encounter. Briefly, the patient is a 83 y.o. female with a history of dementia who presents to the emergency department with left axilla redness and tenderness. Family reports that they noted this several days ago. Denies any history abscesses or breast cancer. Bedside ultrasound performed revealed subcutaneous fluid collection that seems to track medially towards pectoralis. Labs without leukocytosis. BMP did reveal hypernatremia and renal insufficiency. Unable to obtain CT of the chest with contrast given the renal sufficiency. Will discuss case with surgery. Patient will be admitted to medicine for further management of the hypernatremia. Surgery will follow regarding the axillary induration.   EKG Interpretation None           Cardama, Amadeo GarnetPedro Eduardo, MD 10/21/16 1635

## 2016-10-21 NOTE — ED Notes (Signed)
Bed: WA08 Expected date:  Expected time:  Means of arrival:  Comments: 83 yo female- found a hard mass in chest

## 2016-10-21 NOTE — ED Notes (Signed)
Hospitalist at bedside 

## 2016-10-21 NOTE — ED Notes (Signed)
ED Provider at bedside. 

## 2016-10-21 NOTE — ED Provider Notes (Signed)
[facility]-EMERGENCY DEPT Provider Note   CSN: [id number] Arrival date & time: 10/21/16  1227     History   Chief Complaint Chief Complaint  Patient presents with  . Abscess    HPI Cynthia [name] is a 83 y.o. female.  HPI [name] 5 caveat due to dementia  71 YOF presents from [facility] today with reports of abscess. Pt uis unable to proved any information. Per chart review patient was seen today and noted to have abscess to left chest/axilla that has been developing over the weekend; sent to ED for evaluation. Pts daughter is at bedside who reports she saw her mother yesterday and changed her gown and did not note any infectious signs. She reports patients has been eating and drinking normal, no fever or signs of infection; at baseline. No history of the same, no personal or close family history of breast cancer.    Past Medical History:  Diagnosis Date  . CKD (chronic kidney disease)   . Dementia   . Diabetes mellitus without complication (HCC)   . Hypertension   . Hypothyroid   . Hypothyroid   . Thyroid disease     Patient Active Problem List   Diagnosis Date Noted  . Weight loss 10/21/2016  . Abscess of left arm 10/21/2016  . Abscess 10/21/2016  . Hypernatremia 10/21/2016  . Depression with anxiety 05/21/2016  . Hypothyroidism due to acquired atrophy of thyroid 04/15/2016  . Cardiac arrhythmia 04/15/2016  . Chest pain 04/15/2016  . Dementia with behavioral disturbance 04/15/2016  . Psychosis 04/15/2016  . Type 2 diabetes mellitus with stage 4 chronic kidney disease, with long-term current use of insulin (HCC) 04/15/2016  . Hyperlipidemia associated with type 2 diabetes mellitus (HCC) 04/15/2016  . Hypertension associated with diabetes (HCC) 04/15/2016  . GERD without esophagitis 04/15/2016    Past Surgical History:  Procedure Laterality Date  . ABDOMINAL HYSTERECTOMY    . FRACTURE SURGERY      OB History    No data available       Home Medications     Prior to Admission medications   Medication Sig Start Date End Date Taking? Authorizing Provider  Acetaminophen 500 MG coapsule Take 500 mg by mouth 3 (three) times daily.   Yes [provider]  Acetaminophen 500 MG coapsule Take 500 mg by mouth every 6 (six) hours as needed for fever (headache and minor discomfort).   Yes [provider]  amLODipine (NORVASC) 10 MG tablet Take 10 mg by mouth daily. 05/13/15  Yes [provider]  hydrochlorothiazide (HYDRODIURIL) 25 MG tablet Take 25 mg by mouth daily. 05/13/15  Yes [provider]  insulin aspart (NOVOLOG) 100 UNIT/ML injection Inject 3 Units into the skin 3 (three) times daily before meals. Give before meals for blood sugar > 200.  Notify MD is blood sugar is less than 70 or greater than 450.  Hold Insulin if less than 70   Yes [provider]  insulin detemir (LEVEMIR) 100 UNIT/ML injection Inject 5 Units into the skin at bedtime.   Yes [provider]  levothyroxine (SYNTHROID, LEVOTHROID) 137 MCG tablet Take 137 mcg by mouth daily.    Yes [provider]  linagliptin (TRADJENTA) 5 MG TABS tablet Take 1 tablet (5 mg total) by mouth daily. 06/26/15  Yes Rolland Porter, MD  lisinopril (PRINIVIL,ZESTRIL) 10 MG tablet Take 10 mg by mouth daily. 04/26/15  Yes [provider]  loperamide (IMODIUM) 2 MG capsule Take 2 mg by mouth  every 3 (three) hours as needed for diarrhea or loose stools.   Yes [provider]  LORazepam (ATIVAN) 0.5 MG tablet Take two tablets by mouth every evening for anxiety Patient taking differently: Take 1 mg by mouth at bedtime. Take two tablets by mouth every evening for anxiety 10/17/16  Yes Sharee Holster, NP  LORazepam (ATIVAN) 0.5 MG tablet Take 1 tablet (0.5 mg total) by mouth daily. 10/17/16  Yes Sharee Holster, NP  magnesium hydroxide (MILK OF MAGNESIA) 400 MG/5ML suspension Take 30 mLs by mouth at bedtime as needed for mild constipation.   Yes  [provider]  NAMZARIC 28-10 MG CP24 Take 1 capsule by mouth daily. 04/30/15  Yes [provider]  omeprazole (PRILOSEC) 20 MG capsule Take 20 mg by mouth daily. 04/30/15  Yes [provider]  ondansetron (ZOFRAN-ODT) 8 MG disintegrating tablet Take 8 mg by mouth every 8 (eight) hours as needed for nausea or vomiting.   Yes [provider]  QUEtiapine (SEROQUEL) 50 MG tablet Take 25-50 mg by mouth 2 (two) times daily. Take 25 mg in the morning and 50 mg at bedtime   Yes [provider]  sertraline (ZOLOFT) 100 MG tablet Take 100 mg by mouth daily.   Yes [provider]  vitamin B-12 (CYANOCOBALAMIN) 1000 MCG tablet Take 1,000 mcg by mouth daily.   Yes [provider]  XARELTO 20 MG TABS tablet Take 20 mg by mouth daily. 04/26/15  Yes [provider]    Family History No family history on file.  Social History Social History  Substance Use Topics  . Smoking status: Never Smoker  . Smokeless tobacco: Never Used  . Alcohol use No     Allergies   Patient has no known allergies.   Review of Systems Review of Systems  All other systems reviewed and are negative.    Physical Exam Updated Vital Signs BP (!) 161/65 (BP Location: Right Arm)   Pulse 66   Temp 98.1 F (36.7 C) (Oral)   Resp 20   SpO2 94%   Physical Exam  Constitutional: She is oriented to person, place, and time. She appears well-developed and well-nourished.  HENT:  Head: Normocephalic and atraumatic.  Eyes: Pupils are equal, round, and reactive to light. Conjunctivae are normal. Right eye exhibits no discharge. Left eye exhibits no discharge. No scleral icterus.  Neck: Normal range of motion. No JVD present. No tracheal deviation present.  Pulmonary/Chest: Effort normal. No stridor.  Musculoskeletal:  Area of induration with surrounding redness to left axilla. Small area of fluctuance, induration and TTP spreading into pectoralis     Neurological: She is alert and oriented to person, place, and time. Coordination normal.  Psychiatric: She has a normal mood and affect. Her behavior is normal. Judgment and thought content normal.  Nursing note and vitals reviewed.    ED Treatments / Results  Labs (all labs ordered are listed, but only abnormal results are displayed) Labs Reviewed  CBC WITH DIFFERENTIAL/PLATELET - Abnormal; Notable for the following:       Result Value   RBC 3.56 (*)    Hemoglobin 10.1 (*)    HCT 31.8 (*)    All other components within normal limits  BASIC METABOLIC PANEL - Abnormal; Notable for the following:    Sodium 150 (*)    Chloride 120 (*)    Glucose, Bld 245 (*)    BUN 48 (*)    Creatinine, Ser 1.50 (*)  GFR calc non Af Amer 30 (*)    GFR calc Af Amer 35 (*)    All other components within normal limits  MRSA PCR SCREENING  COMPREHENSIVE METABOLIC PANEL  CBC  I-STAT CG4 LACTIC ACID, ED    EKG  EKG Interpretation None       Radiology No results found.  Procedures Procedures (including critical care time)  Medications Ordered in ED Medications  levothyroxine (SYNTHROID, LEVOTHROID) tablet 137 mcg (not administered)  LORazepam (ATIVAN) tablet 0.5 mg (not administered)  ondansetron (ZOFRAN-ODT) disintegrating tablet 8 mg (not administered)  insulin detemir (LEVEMIR) injection 5 Units (not administered)  amLODipine (NORVASC) tablet 5 mg (not administered)  sertraline (ZOLOFT) tablet 100 mg (not administered)  insulin aspart (novoLOG) injection 0-9 Units (not administered)  QUEtiapine (SEROQUEL) tablet 25 mg (not administered)    And  QUEtiapine (SEROQUEL) tablet 50 mg (not administered)  memantine (NAMENDA XR) 24 hr capsule 28 mg (not administered)    And  donepezil (ARICEPT) tablet 10 mg (not administered)  clindamycin (CLEOCIN) IVPB 600 mg (not administered)  sodium chloride 0.9 % bolus 1,000 mL (1,000 mLs Intravenous New Bag/Given 10/21/16 1658)     Initial  Impression / Assessment and Plan / ED Course  I have reviewed the triage vital signs and the nursing notes.  Pertinent labs & imaging results that were available during my care of the patient were reviewed by me and considered in my medical decision making (see chart for details).     Final Clinical Impressions(s) / ED Diagnoses   Final diagnoses:  Hypernatremia  Abscess   Labs: cbc. Bmp, lactic acid  Imaging:  Consults:  Therapeutics:  Discharge Meds:   Assessment/Plan: 83 year old female presents today with likely abscess to the right axilla. He is presently afebrile nontoxic in no acute distress. She does have significant induration extending up into the pectoralis. General surgery was consulted who recommended hospital admission with likely OR I&D.  Patient has no signs of sepsis here, she is hyponatremic likely secondary to hemoconcentration and she appears dehydrated. Patient given normal saline, tried consult for admission.   New Prescriptions Current Discharge Medication List       Eyvonne MechanicHedges, Shakisha Abend, Cordelia Poche-C 10/21/16 2156

## 2016-10-21 NOTE — H&P (Signed)
History and Physical    Cynthia [name] VWU:[id number] DOB: [birth year] DOA: 10/21/2016  I have briefly reviewed the patient's prior medical records in [facility]  PCP: Cynthia Boys, DO  Patient coming from: SNF  Chief Complaint: left axillary swelling  HPI: Cynthia [name] is a 83 y.o. female with medical history significant of advanced dementia with behavioral disturbances, hypothyroidism, prior recurrent DVT, insulin-dependent diabetes mellitus, hypertension, who is being brought to the emergency room from her nursing home due to left axillary swelling.  Patient has advanced dementia and is unable to contribute to the story.  Daughters at bedside, who tells me that patient has been having progressive swelling of her left axillary region, that rapidly grew over the past 24 hours.  She does not recall seeing any swelling or redness yesterday.  It seems like it appeared overnight.  There are no reported fever or chills from the nursing home.  ED Course: In the emergency room patient is afebrile, blood pressure is within normal limits, blood work is pertinent for a sodium of 150, creatinine 1.5, hemoglobin of 10.1.  General surgery was consulted, and [facility] is asked for admission due to ongoing medical problems  Review of Systems: As per HPI otherwise 10 point review of systems negative.   Past Medical History:  Diagnosis Date  . CKD (chronic kidney disease)   . Dementia   . Diabetes mellitus without complication (HCC)   . Hypertension   . Hypothyroid   . Hypothyroid   . Thyroid disease     Past Surgical History:  Procedure Laterality Date  . ABDOMINAL HYSTERECTOMY    . FRACTURE SURGERY       reports that she has never smoked. She has never used smokeless tobacco. She reports that she does not drink alcohol or use drugs.  No Known Allergies  Unable to obtain family history due to dementia  Prior to Admission medications   Medication Sig Start Date End Date Taking?  Authorizing Provider  Acetaminophen 500 MG coapsule Take 500 mg by mouth 3 (three) times daily.   Yes [provider]  Acetaminophen 500 MG coapsule Take 500 mg by mouth every 6 (six) hours as needed for fever (headache and minor discomfort).   Yes [provider]  amLODipine (NORVASC) 10 MG tablet Take 10 mg by mouth daily. 05/13/15  Yes [provider]  hydrochlorothiazide (HYDRODIURIL) 25 MG tablet Take 25 mg by mouth daily. 05/13/15  Yes [provider]  insulin aspart (NOVOLOG) 100 UNIT/ML injection Inject 3 Units into the skin 3 (three) times daily before meals. Give before meals for blood sugar > 200.  Notify MD is blood sugar is less than 70 or greater than 450.  Hold Insulin if less than 70   Yes [provider]  insulin detemir (LEVEMIR) 100 UNIT/ML injection Inject 5 Units into the skin at bedtime.   Yes [provider]  levothyroxine (SYNTHROID, LEVOTHROID) 137 MCG tablet Take 137 mcg by mouth daily.    Yes [provider]  linagliptin (TRADJENTA) 5 MG TABS tablet Take 1 tablet (5 mg total) by mouth daily. 06/26/15  Yes Rolland Porter, MD  lisinopril (PRINIVIL,ZESTRIL) 10 MG tablet Take 10 mg by mouth daily. 04/26/15  Yes [provider]  loperamide (IMODIUM) 2 MG capsule Take 2 mg by mouth every 3 (three) hours as needed for diarrhea or loose stools.   Yes [provider]  LORazepam (ATIVAN) 0.5 MG tablet Take two tablets by mouth every evening  for anxiety Patient taking differently: Take 1 mg by mouth at bedtime. Take two tablets by mouth every evening for anxiety 10/17/16  Yes Sharee HolsterGreen, Deborah S, NP  LORazepam (ATIVAN) 0.5 MG tablet Take 1 tablet (0.5 mg total) by mouth daily. 10/17/16  Yes Sharee HolsterGreen, Deborah S, NP  magnesium hydroxide (MILK OF MAGNESIA) 400 MG/5ML suspension Take 30 mLs by mouth at bedtime as needed for mild constipation.   Yes [provider]  NAMZARIC 28-10 MG CP24 Take 1 capsule by mouth daily.  04/30/15  Yes [provider]  omeprazole (PRILOSEC) 20 MG capsule Take 20 mg by mouth daily. 04/30/15  Yes [provider]  ondansetron (ZOFRAN-ODT) 8 MG disintegrating tablet Take 8 mg by mouth every 8 (eight) hours as needed for nausea or vomiting.   Yes [provider]  QUEtiapine (SEROQUEL) 50 MG tablet Take 25-50 mg by mouth 2 (two) times daily. Take 25 mg in the morning and 50 mg at bedtime   Yes [provider]  sertraline (ZOLOFT) 100 MG tablet Take 100 mg by mouth daily.   Yes [provider]  vitamin B-12 (CYANOCOBALAMIN) 1000 MCG tablet Take 1,000 mcg by mouth daily.   Yes [provider]  XARELTO 20 MG TABS tablet Take 20 mg by mouth daily. 04/26/15  Yes [provider]    Physical Exam: Vitals:   10/21/16 1248 10/21/16 1515 10/21/16 1530 10/21/16 1545  BP: (!) 114/53 120/74 (!) 142/70 133/66  Pulse: 84 76 72 70  Resp: 14   18  Temp: 98.4 F (36.9 C)     TempSrc: Oral     SpO2: 98% 95% 98% 98%      Constitutional: NAD Eyes: PERRL, lids and conjunctivae normal ENMT: Mucous membranes are moist. Posterior pharynx clear of any exudate or lesions.  Poor dentition Neck: normal, supple Respiratory: clear to auscultation bilaterally, no wheezing, no crackles. Normal respiratory effort. No accessory muscle use.  Cardiovascular: Regular rate and rhythm, no murmurs / rubs / gallops. No extremity edema. 2+ pedal pulses.  Abdomen: no tenderness, no masses palpated. Bowel sounds positive.  Musculoskeletal: no clubbing / cyanosis.  Decreased muscle tone.  Skin: no rashes, lesions, ulcers. No induration Neurologic: Nonfocal, moves all 4 extremities, strength appears equal however does not follow commands consistently Psychiatric: Does not answer any orientation questions  Labs on Admission: I have personally reviewed following labs and imaging studies  CBC:  Recent Labs Lab 10/21/16 1345  WBC 6.1  NEUTROABS 4.2  HGB  10.1*  HCT 31.8*  MCV 89.3  PLT 296   Basic Metabolic Panel:  Recent Labs Lab 10/21/16 1345  NA 150*  K 5.0  CL 120*  CO2 24  GLUCOSE 245*  BUN 48*  CREATININE 1.50*  CALCIUM 9.4   GFR: Estimated Creatinine Clearance: 25.2 mL/min (A) (by C-G formula based on SCr of 1.5 mg/dL (H)). Liver Function Tests: No results for input(s): AST, ALT, ALKPHOS, BILITOT, PROT, ALBUMIN in the last 168 hours. No results for input(s): LIPASE, AMYLASE in the last 168 hours. No results for input(s): AMMONIA in the last 168 hours. Coagulation Profile: No results for input(s): INR, PROTIME in the last 168 hours. Cardiac Enzymes: No results for input(s): CKTOTAL, CKMB, CKMBINDEX, TROPONINI in the last 168 hours. BNP (last 3 results) No results for input(s): PROBNP in the last 8760 hours. HbA1C: No results for input(s): HGBA1C in the last 72 hours. CBG: No results for input(s): GLUCAP in the last 168 hours. Lipid Profile:  No results for input(s): CHOL, HDL, LDLCALC, TRIG, CHOLHDL, LDLDIRECT in the last 72 hours. Thyroid Function Tests: No results for input(s): TSH, T4TOTAL, FREET4, T3FREE, THYROIDAB in the last 72 hours. Anemia Panel: No results for input(s): VITAMINB12, FOLATE, FERRITIN, TIBC, IRON, RETICCTPCT in the last 72 hours. Urine analysis:    Component Value Date/Time   COLORURINE YELLOW 06/26/2015 1110   APPEARANCEUR CLEAR 06/26/2015 1110   LABSPEC 1.024 06/26/2015 1110   PHURINE 6.0 06/26/2015 1110   GLUCOSEU >1000 (A) 06/26/2015 1110   HGBUR NEGATIVE 06/26/2015 1110   BILIRUBINUR NEGATIVE 06/26/2015 1110   KETONESUR NEGATIVE 06/26/2015 1110   PROTEINUR NEGATIVE 06/26/2015 1110   NITRITE POSITIVE (A) 06/26/2015 1110   LEUKOCYTESUR NEGATIVE 06/26/2015 1110     Radiological Exams on Admission: No results found.  ED telemetry: Sinus rhythm  Assessment/Plan Active Problems:   Hypothyroidism due to acquired atrophy of thyroid   Dementia with behavioral disturbance    Type 2 diabetes mellitus with stage 4 chronic kidney disease, with long-term current use of insulin (HCC)   Hyperlipidemia associated with type 2 diabetes mellitus (HCC)   Hypertension associated with diabetes (HCC)   Depression with anxiety   Abscess   Left axillary abscess -General surgery consulted, appreciate input.  Looks like she may need IND.  She is on Xarelto which based on nursing home notes she received last dose this morning at 8 AM -She is afebrile, lactic acid is normal, white count is normal hold antibiotics will probably need to be started after I&D or unless she is febrile overnight  Type 2 diabetes mellitus -Resume home long-acting insulin, place on sliding scale insulin, hold oral agents  Hypertension -Resume home medications  Dementia with behavioral disturbances -Resume home medications   Chronic kidney disease stage IV -Baseline creatinine anywhere from 1.2-1.8, currently appears at baseline at 1.5  Hypothyroidism  -Resume Synthroid in the morning  History of recurrent DVTs -most recent DVT was about 2 years ago per daughter.  Hold Xarelto for now   DVT prophylaxis: Xarelto  Code Status: DNR  Family Communication: d/w daughter bedside Disposition Plan: admit to [facility] called: General surgery    Admission status: Observation  At the point of initial evaluation, it is my clinical opinion that admission for OBSERVATION is reasonable and necessary because the patient's presenting complaints in the context of their chronic conditions represent sufficient risk of deterioration or significant morbidity to constitute reasonable grounds for close observation in the hospital setting, but that the patient may be medically stable for discharge from the hospital within 24 to 48 hours.   Pamella Pert, MD [facility] Pager [phone]  If 7PM-7AM, please contact night-coverage [email or web address] Password [facility]  10/21/2016, 5:54 PM

## 2016-10-21 NOTE — ED Notes (Signed)
Surgical PA at bedside  

## 2016-10-21 NOTE — Consult Note (Signed)
[facility] Consult/Admission Note  Cynthia [name] Apr 24, 1927  [id number].    Requesting MD: Lenn Sink, PA-C Chief Complaint/Reason for Consult: left axilla abscess  HPI:   Pt is a 83 year old female who lives at [facility], does not ambulate, wheelchair bound, with a history of CKD, dementia, DM type II, HTN, hypothyroidism who presented to the [facility] ED with complaints of redness in left axilla. Daughter at bedside. She states she dressed the pt last night and did not notice any redness in her axilla. Pt states no pain in this area. Pt denies pain, abdominal pain, fever, chills, CP, SOB. Daughter states she is unaware of any complaints from the pt. No associated symptoms. We were asked to see. Lactic acid 1.43.  ED Course:  Labs: Sodium 150, Chloride 120, glucose 245, BUN 48, Creatinine 1.5, GFR 35, Hg 10.1  ROS:  Review of Systems  Constitutional: Negative for chills and fever.  Respiratory: Negative for shortness of breath.   Cardiovascular: Negative for chest pain.  Gastrointestinal: Negative for abdominal pain, nausea and vomiting.  Musculoskeletal: Negative for joint pain.  Skin:       Abscess/cellulitis of left axilla  Neurological: Negative for loss of consciousness.  All other systems reviewed and are negative.    No family history on file.  Past Medical History:  Diagnosis Date  . CKD (chronic kidney disease)   . Dementia   . Diabetes mellitus without complication ([place])   . Hypertension   . Hypothyroid   . Hypothyroid   . Thyroid disease     Past Surgical History:  Procedure Laterality Date  . ABDOMINAL HYSTERECTOMY    . FRACTURE SURGERY      Social History:  reports that she has never smoked. She has never used smokeless tobacco. She reports that she does not drink alcohol or use drugs.  Allergies: No Known Allergies   (Not in a hospital admission)  Blood pressure (!) 114/53, pulse 84, temperature 98.4 F (36.9 C), temperature source  Oral, resp. rate 14, SpO2 98 %.  Physical Exam  Constitutional: Vital signs are normal. No distress.  Thin, elderly, AA female  HENT:  Head: Normocephalic and atraumatic.  Nose: Nose normal.  Mouth/Throat: Oropharynx is clear and moist. No oropharyngeal exudate.  Eyes: Pupils are equal, round, and reactive to light. Conjunctivae are normal. Right eye exhibits no discharge. Left eye exhibits no discharge. No scleral icterus.  Neck: Normal range of motion. Neck supple. No tracheal deviation present. No thyromegaly present.  Cardiovascular: Normal rate, regular rhythm, normal heart sounds and intact distal pulses.  Exam reveals no gallop and no friction rub.   No murmur heard. Pulses:      Radial pulses are 2+ on the right side, and 2+ on the left side.       Dorsalis pedis pulses are 2+ on the right side, and 2+ on the left side.  Pulmonary/Chest: Effort normal and breath sounds normal. No respiratory distress. She has no decreased breath sounds. She has no wheezes. She has no rhonchi. She has no rales.  Abdominal: Soft. Bowel sounds are normal. She exhibits no distension and no mass. There is no tenderness. There is no rebound and no guarding. No hernia.  Musculoskeletal: She exhibits no edema, tenderness or deformity.  Good PROM of BUE  Neurological: She is alert. No cranial nerve deficit (grossly intact).  Skin: Skin is warm and dry. Rash noted. She is not diaphoretic.  Left axilla with erythema and induration that  extends down bicep region and also medially across pec region. Area of fluctuance noted in anterior aspect of axilla. No TTP  Psychiatric: Mood and affect normal.  Nursing note and vitals reviewed.   Results for orders placed or performed during the hospital encounter of 10/21/16 (from the past 48 hour(s))  CBC with Differential     Status: Abnormal   Collection Time: 10/21/16  1:45 PM  Result Value Ref Range   WBC 6.1 4.0 - 10.5 K/uL   RBC 3.56 (L) 3.87 - 5.11 MIL/uL    Hemoglobin 10.1 (L) 12.0 - 15.0 g/dL   HCT 31.8 (L) 36.0 - 46.0 %   MCV 89.3 78.0 - 100.0 fL   MCH 28.4 26.0 - 34.0 pg   MCHC 31.8 30.0 - 36.0 g/dL   RDW 14.6 11.5 - 15.5 %   Platelets 296 150 - 400 K/uL   Neutrophils Relative % 70 %   Neutro Abs 4.2 1.7 - 7.7 K/uL   Lymphocytes Relative 23 %   Lymphs Abs 1.4 0.7 - 4.0 K/uL   Monocytes Relative 5 %   Monocytes Absolute 0.3 0.1 - 1.0 K/uL   Eosinophils Relative 2 %   Eosinophils Absolute 0.1 0.0 - 0.7 K/uL   Basophils Relative 0 %   Basophils Absolute 0.0 0.0 - 0.1 K/uL  Basic metabolic panel     Status: Abnormal   Collection Time: 10/21/16  1:45 PM  Result Value Ref Range   Sodium 150 (H) 135 - 145 mmol/L   Potassium 5.0 3.5 - 5.1 mmol/L   Chloride 120 (H) 101 - 111 mmol/L   CO2 24 22 - 32 mmol/L   Glucose, Bld 245 (H) 65 - 99 mg/dL   BUN 48 (H) 6 - 20 mg/dL   Creatinine, Ser 1.50 (H) 0.44 - 1.00 mg/dL   Calcium 9.4 8.9 - 10.3 mg/dL   GFR calc non Af Amer 30 (L) >60 mL/min   GFR calc Af Amer 35 (L) >60 mL/min    Comment: (NOTE) The eGFR has been calculated using the CKD EPI equation. This calculation has not been validated in all clinical situations. eGFR's persistently <60 mL/min signify possible Chronic Kidney Disease.    Anion gap 6 5 - 15  I-Stat CG4 Lactic Acid, ED     Status: None   Collection Time: 10/21/16  1:56 PM  Result Value Ref Range   Lactic Acid, Venous 1.43 0.5 - 1.9 mmol/L   No results found.    Assessment/Plan  Abscess of left axilla - will likely need OR I&D, will discuss with MD - admit to medicine   We will continue to follow this pt. Thank you for the consult.   Kalman Drape, [facility] 10/21/2016, 3:51 PM Pager: [phone] Consults: [phone] Mon-Fri 7:00 am-4:30 pm Sat-Sun 7:00 am-11:30 am

## 2016-10-22 ENCOUNTER — Observation Stay (HOSPITAL_COMMUNITY): Payer: Medicare Other | Admitting: Anesthesiology

## 2016-10-22 ENCOUNTER — Encounter (HOSPITAL_COMMUNITY): Payer: Self-pay | Admitting: General Surgery

## 2016-10-22 ENCOUNTER — Encounter (HOSPITAL_COMMUNITY)
Admission: EM | Disposition: A | Discharge status: Skilled Nursing Facility | Payer: Self-pay | Source: Home / Self Care | Attending: Internal Medicine

## 2016-10-22 DIAGNOSIS — K219 Gastro-esophageal reflux disease without esophagitis: Secondary | ICD-10-CM | POA: Diagnosis not present

## 2016-10-22 DIAGNOSIS — E860 Dehydration: Secondary | ICD-10-CM | POA: Diagnosis present

## 2016-10-22 DIAGNOSIS — I10 Essential (primary) hypertension: Secondary | ICD-10-CM | POA: Diagnosis not present

## 2016-10-22 DIAGNOSIS — E1169 Type 2 diabetes mellitus with other specified complication: Secondary | ICD-10-CM | POA: Diagnosis present

## 2016-10-22 DIAGNOSIS — Z794 Long term (current) use of insulin: Secondary | ICD-10-CM | POA: Diagnosis not present

## 2016-10-22 DIAGNOSIS — E1122 Type 2 diabetes mellitus with diabetic chronic kidney disease: Secondary | ICD-10-CM | POA: Diagnosis present

## 2016-10-22 DIAGNOSIS — E034 Atrophy of thyroid (acquired): Secondary | ICD-10-CM | POA: Diagnosis present

## 2016-10-22 DIAGNOSIS — F418 Other specified anxiety disorders: Secondary | ICD-10-CM | POA: Diagnosis present

## 2016-10-22 DIAGNOSIS — Z86718 Personal history of other venous thrombosis and embolism: Secondary | ICD-10-CM | POA: Diagnosis not present

## 2016-10-22 DIAGNOSIS — F0391 Unspecified dementia with behavioral disturbance: Secondary | ICD-10-CM | POA: Diagnosis not present

## 2016-10-22 DIAGNOSIS — L0291 Cutaneous abscess, unspecified: Secondary | ICD-10-CM | POA: Diagnosis not present

## 2016-10-22 DIAGNOSIS — Z7901 Long term (current) use of anticoagulants: Secondary | ICD-10-CM | POA: Diagnosis not present

## 2016-10-22 DIAGNOSIS — S40022S Contusion of left upper arm, sequela: Secondary | ICD-10-CM | POA: Diagnosis not present

## 2016-10-22 DIAGNOSIS — N184 Chronic kidney disease, stage 4 (severe): Secondary | ICD-10-CM | POA: Diagnosis present

## 2016-10-22 DIAGNOSIS — Z79899 Other long term (current) drug therapy: Secondary | ICD-10-CM | POA: Diagnosis not present

## 2016-10-22 DIAGNOSIS — X58XXXA Exposure to other specified factors, initial encounter: Secondary | ICD-10-CM | POA: Diagnosis present

## 2016-10-22 DIAGNOSIS — I129 Hypertensive chronic kidney disease with stage 1 through stage 4 chronic kidney disease, or unspecified chronic kidney disease: Secondary | ICD-10-CM | POA: Diagnosis present

## 2016-10-22 DIAGNOSIS — F0151 Vascular dementia with behavioral disturbance: Secondary | ICD-10-CM | POA: Diagnosis not present

## 2016-10-22 DIAGNOSIS — E1159 Type 2 diabetes mellitus with other circulatory complications: Secondary | ICD-10-CM | POA: Diagnosis not present

## 2016-10-22 DIAGNOSIS — Z7401 Bed confinement status: Secondary | ICD-10-CM | POA: Diagnosis not present

## 2016-10-22 DIAGNOSIS — S40022A Contusion of left upper arm, initial encounter: Secondary | ICD-10-CM | POA: Diagnosis present

## 2016-10-22 DIAGNOSIS — Z90710 Acquired absence of both cervix and uterus: Secondary | ICD-10-CM | POA: Diagnosis not present

## 2016-10-22 DIAGNOSIS — E870 Hyperosmolality and hypernatremia: Secondary | ICD-10-CM | POA: Diagnosis present

## 2016-10-22 DIAGNOSIS — Z66 Do not resuscitate: Secondary | ICD-10-CM | POA: Diagnosis present

## 2016-10-22 DIAGNOSIS — R229 Localized swelling, mass and lump, unspecified: Secondary | ICD-10-CM | POA: Diagnosis present

## 2016-10-22 DIAGNOSIS — E785 Hyperlipidemia, unspecified: Secondary | ICD-10-CM | POA: Diagnosis present

## 2016-10-22 DIAGNOSIS — E871 Hypo-osmolality and hyponatremia: Secondary | ICD-10-CM | POA: Diagnosis present

## 2016-10-22 DIAGNOSIS — N179 Acute kidney failure, unspecified: Secondary | ICD-10-CM | POA: Diagnosis present

## 2016-10-22 HISTORY — PX: INCISION AND DRAINAGE ABSCESS: SHX5864

## 2016-10-22 LAB — COMPREHENSIVE METABOLIC PANEL
ALT: 16 U/L (ref 14–54)
ANION GAP: 7 (ref 5–15)
AST: 14 U/L — AB (ref 15–41)
Albumin: 3.1 g/dL — ABNORMAL LOW (ref 3.5–5.0)
Alkaline Phosphatase: 88 U/L (ref 38–126)
BILIRUBIN TOTAL: 0.5 mg/dL (ref 0.3–1.2)
BUN: 36 mg/dL — ABNORMAL HIGH (ref 6–20)
CHLORIDE: 117 mmol/L — AB (ref 101–111)
CO2: 25 mmol/L (ref 22–32)
Calcium: 9 mg/dL (ref 8.9–10.3)
Creatinine, Ser: 1.32 mg/dL — ABNORMAL HIGH (ref 0.44–1.00)
GFR calc Af Amer: 41 mL/min — ABNORMAL LOW (ref >60)
GFR, EST NON AFRICAN AMERICAN: 35 mL/min — AB (ref >60)
Glucose, Bld: 204 mg/dL — ABNORMAL HIGH (ref 65–99)
POTASSIUM: 4.6 mmol/L (ref 3.5–5.1)
Sodium: 149 mmol/L — ABNORMAL HIGH (ref 135–145)
TOTAL PROTEIN: 6.8 g/dL (ref 6.5–8.1)

## 2016-10-22 LAB — CBC
HEMATOCRIT: 30.8 % — AB (ref 36.0–46.0)
HEMOGLOBIN: 9.7 g/dL — AB (ref 12.0–15.0)
MCH: 28.3 pg (ref 26.0–34.0)
MCHC: 31.5 g/dL (ref 30.0–36.0)
MCV: 89.8 fL (ref 78.0–100.0)
Platelets: 280 10*3/uL (ref 150–400)
RBC: 3.43 MIL/uL — AB (ref 3.87–5.11)
RDW: 14.8 % (ref 11.5–15.5)
WBC: 5.8 10*3/uL (ref 4.0–10.5)

## 2016-10-22 LAB — GLUCOSE, CAPILLARY
GLUCOSE-CAPILLARY: 166 mg/dL — AB (ref 65–99)
Glucose-Capillary: 155 mg/dL — ABNORMAL HIGH (ref 65–99)
Glucose-Capillary: 146 mg/dL — ABNORMAL HIGH (ref 65–99)
Glucose-Capillary: 218 mg/dL — ABNORMAL HIGH (ref 65–99)
Glucose-Capillary: 220 mg/dL — ABNORMAL HIGH (ref 65–99)

## 2016-10-22 SURGERY — INCISION AND DRAINAGE, ABSCESS
Anesthesia: General | Site: Axilla | Laterality: Left

## 2016-10-22 MED ORDER — KETAMINE HCL 10 MG/ML IJ SOLN
INTRAMUSCULAR | Status: AC
  Filled 2016-10-22: qty 1

## 2016-10-22 MED ORDER — BUPIVACAINE-EPINEPHRINE 0.25% -1:200000 IJ SOLN
INTRAMUSCULAR | Status: DC | PRN
  Administered 2016-10-22: 3 mL

## 2016-10-22 MED ORDER — EPHEDRINE 5 MG/ML INJ
INTRAVENOUS | Status: AC
  Filled 2016-10-22: qty 10

## 2016-10-22 MED ORDER — FENTANYL CITRATE (PF) 250 MCG/5ML IJ SOLN
INTRAMUSCULAR | Status: DC | PRN
  Administered 2016-10-22 (×2): 25 ug via INTRAVENOUS

## 2016-10-22 MED ORDER — LIDOCAINE 2% (20 MG/ML) 5 ML SYRINGE
INTRAMUSCULAR | Status: AC
  Filled 2016-10-22: qty 5

## 2016-10-22 MED ORDER — FENTANYL CITRATE (PF) 250 MCG/5ML IJ SOLN
INTRAMUSCULAR | Status: AC
  Filled 2016-10-22: qty 5

## 2016-10-22 MED ORDER — ONDANSETRON HCL 4 MG/2ML IJ SOLN
INTRAMUSCULAR | Status: DC | PRN
  Administered 2016-10-22: 4 mg via INTRAVENOUS

## 2016-10-22 MED ORDER — METOCLOPRAMIDE HCL 5 MG/ML IJ SOLN: 10.0000 mg | Freq: Once | INTRAMUSCULAR | Status: DC | PRN

## 2016-10-22 MED ORDER — DEXAMETHASONE SODIUM PHOSPHATE 10 MG/ML IJ SOLN
INTRAMUSCULAR | Status: AC
  Filled 2016-10-22: qty 2

## 2016-10-22 MED ORDER — PHENYLEPHRINE 40 MCG/ML (10ML) SYRINGE FOR IV PUSH (FOR BLOOD PRESSURE SUPPORT)
PREFILLED_SYRINGE | INTRAVENOUS | Status: DC | PRN
  Administered 2016-10-22: 80 ug via INTRAVENOUS
  Administered 2016-10-22: 180 ug via INTRAVENOUS
  Administered 2016-10-22 (×2): 240 ug via INTRAVENOUS

## 2016-10-22 MED ORDER — PHENYLEPHRINE 40 MCG/ML (10ML) SYRINGE FOR IV PUSH (FOR BLOOD PRESSURE SUPPORT)
PREFILLED_SYRINGE | INTRAVENOUS | Status: AC
  Filled 2016-10-22: qty 20

## 2016-10-22 MED ORDER — 0.9 % SODIUM CHLORIDE (POUR BTL) OPTIME
TOPICAL | Status: DC | PRN
  Administered 2016-10-22: 1000 mL

## 2016-10-22 MED ORDER — PROPOFOL 10 MG/ML IV BOLUS
INTRAVENOUS | Status: AC
  Filled 2016-10-22: qty 20

## 2016-10-22 MED ORDER — LIDOCAINE 2% (20 MG/ML) 5 ML SYRINGE
INTRAMUSCULAR | Status: AC
  Filled 2016-10-22: qty 10

## 2016-10-22 MED ORDER — PHENYLEPHRINE HCL 10 MG/ML IJ SOLN
INTRAVENOUS | Status: DC | PRN
  Administered 2016-10-22: 50 ug/min via INTRAVENOUS

## 2016-10-22 MED ORDER — PHENYLEPHRINE HCL 10 MG/ML IJ SOLN
INTRAMUSCULAR | Status: AC
  Filled 2016-10-22: qty 1

## 2016-10-22 MED ORDER — ONDANSETRON HCL 4 MG/2ML IJ SOLN
INTRAMUSCULAR | Status: AC
  Filled 2016-10-22: qty 2

## 2016-10-22 MED ORDER — EPHEDRINE SULFATE-NACL 50-0.9 MG/10ML-% IV SOSY
PREFILLED_SYRINGE | INTRAVENOUS | Status: DC | PRN
  Administered 2016-10-22: 5 mg via INTRAVENOUS
  Administered 2016-10-22: 20 mg via INTRAVENOUS
  Administered 2016-10-22: 15 mg via INTRAVENOUS
  Administered 2016-10-22: 10 mg via INTRAVENOUS

## 2016-10-22 MED ORDER — FENTANYL CITRATE (PF) 100 MCG/2ML IJ SOLN: 25.0000 ug | INTRAMUSCULAR | Status: DC | PRN

## 2016-10-22 MED ORDER — MEPERIDINE HCL 50 MG/ML IJ SOLN: 6.2500 mg | INTRAMUSCULAR | Status: DC | PRN

## 2016-10-22 MED ORDER — ONDANSETRON HCL 4 MG/2ML IJ SOLN
INTRAMUSCULAR | Status: AC
  Filled 2016-10-22: qty 4

## 2016-10-22 MED ORDER — BUPIVACAINE-EPINEPHRINE (PF) 0.25% -1:200000 IJ SOLN
INTRAMUSCULAR | Status: AC
  Filled 2016-10-22: qty 30

## 2016-10-22 MED ORDER — KETAMINE HCL 10 MG/ML IJ SOLN
INTRAMUSCULAR | Status: DC | PRN
  Administered 2016-10-22: 10 mg via INTRAVENOUS

## 2016-10-22 MED ORDER — LACTATED RINGERS IV SOLN
INTRAVENOUS | Status: DC | PRN
  Administered 2016-10-22: 07:00:00 via INTRAVENOUS

## 2016-10-22 MED ORDER — LIDOCAINE 2% (20 MG/ML) 5 ML SYRINGE
INTRAMUSCULAR | Status: DC | PRN
  Administered 2016-10-22: 100 mg via INTRAVENOUS

## 2016-10-22 SURGICAL SUPPLY — 23 items
BNDG GAUZE ELAST 4 BULKY (GAUZE/BANDAGES/DRESSINGS) IMPLANT
DECANTER SPIKE VIAL GLASS SM (MISCELLANEOUS) IMPLANT
DRAPE LAPAROSCOPIC ABDOMINAL (DRAPES) IMPLANT
DRSG PAD ABDOMINAL 8X10 ST (GAUZE/BANDAGES/DRESSINGS) IMPLANT
ELECT REM PT RETURN 15FT ADLT (MISCELLANEOUS) ×3 IMPLANT
GAUZE SPONGE 4X4 12PLY STRL (GAUZE/BANDAGES/DRESSINGS) ×3 IMPLANT
GLOVE BIO SURGEON STRL SZ7.5 (GLOVE) ×3 IMPLANT
GLOVE ECLIPSE 7.5 STRL STRAW (GLOVE) ×3 IMPLANT
GOWN STRL REUS W/TWL XL LVL3 (GOWN DISPOSABLE) ×6 IMPLANT
KIT BASIN OR (CUSTOM PROCEDURE TRAY) ×3 IMPLANT
NEEDLE HYPO 25X1 1.5 SAFETY (NEEDLE) IMPLANT
NS IRRIG 1000ML POUR BTL (IV SOLUTION) ×3 IMPLANT
PACK GENERAL/GYN (CUSTOM PROCEDURE TRAY) ×3 IMPLANT
SPONGE LAP 18X18 X RAY DECT (DISPOSABLE) IMPLANT
SUT MNCRL AB 4-0 PS2 18 (SUTURE) IMPLANT
SUT VIC AB 3-0 SH 27 (SUTURE)
SUT VIC AB 3-0 SH 27XBRD (SUTURE) IMPLANT
SWAB COLLECTION DEVICE MRSA (MISCELLANEOUS) ×3 IMPLANT
SWAB CULTURE ESWAB REG 1ML (MISCELLANEOUS) ×3 IMPLANT
SYR CONTROL 10ML LL (SYRINGE) IMPLANT
TAPE CLOTH SURG 4X10 WHT LF (GAUZE/BANDAGES/DRESSINGS) ×3 IMPLANT
TOWEL OR 17X26 10 PK STRL BLUE (TOWEL DISPOSABLE) ×3 IMPLANT
TOWEL OR NON WOVEN STRL DISP B (DISPOSABLE) IMPLANT

## 2016-10-22 NOTE — H&P (View-Only) (Signed)
[facility] Consult/Admission Note  Cynthia [name] Apr 24, 1927  [id number].    Requesting MD: Lenn Sink, PA-C Chief Complaint/Reason for Consult: left axilla abscess  HPI:   Pt is a 83 year old female who lives at [facility], does not ambulate, wheelchair bound, with a history of CKD, dementia, DM type II, HTN, hypothyroidism who presented to the [facility] ED with complaints of redness in left axilla. Daughter at bedside. She states she dressed the pt last night and did not notice any redness in her axilla. Pt states no pain in this area. Pt denies pain, abdominal pain, fever, chills, CP, SOB. Daughter states she is unaware of any complaints from the pt. No associated symptoms. We were asked to see. Lactic acid 1.43.  ED Course:  Labs: Sodium 150, Chloride 120, glucose 245, BUN 48, Creatinine 1.5, GFR 35, Hg 10.1  ROS:  Review of Systems  Constitutional: Negative for chills and fever.  Respiratory: Negative for shortness of breath.   Cardiovascular: Negative for chest pain.  Gastrointestinal: Negative for abdominal pain, nausea and vomiting.  Musculoskeletal: Negative for joint pain.  Skin:       Abscess/cellulitis of left axilla  Neurological: Negative for loss of consciousness.  All other systems reviewed and are negative.    No family history on file.  Past Medical History:  Diagnosis Date  . CKD (chronic kidney disease)   . Dementia   . Diabetes mellitus without complication ([place])   . Hypertension   . Hypothyroid   . Hypothyroid   . Thyroid disease     Past Surgical History:  Procedure Laterality Date  . ABDOMINAL HYSTERECTOMY    . FRACTURE SURGERY      Social History:  reports that she has never smoked. She has never used smokeless tobacco. She reports that she does not drink alcohol or use drugs.  Allergies: No Known Allergies   (Not in a hospital admission)  Blood pressure (!) 114/53, pulse 84, temperature 98.4 F (36.9 C), temperature source  Oral, resp. rate 14, SpO2 98 %.  Physical Exam  Constitutional: Vital signs are normal. No distress.  Thin, elderly, AA female  HENT:  Head: Normocephalic and atraumatic.  Nose: Nose normal.  Mouth/Throat: Oropharynx is clear and moist. No oropharyngeal exudate.  Eyes: Pupils are equal, round, and reactive to light. Conjunctivae are normal. Right eye exhibits no discharge. Left eye exhibits no discharge. No scleral icterus.  Neck: Normal range of motion. Neck supple. No tracheal deviation present. No thyromegaly present.  Cardiovascular: Normal rate, regular rhythm, normal heart sounds and intact distal pulses.  Exam reveals no gallop and no friction rub.   No murmur heard. Pulses:      Radial pulses are 2+ on the right side, and 2+ on the left side.       Dorsalis pedis pulses are 2+ on the right side, and 2+ on the left side.  Pulmonary/Chest: Effort normal and breath sounds normal. No respiratory distress. She has no decreased breath sounds. She has no wheezes. She has no rhonchi. She has no rales.  Abdominal: Soft. Bowel sounds are normal. She exhibits no distension and no mass. There is no tenderness. There is no rebound and no guarding. No hernia.  Musculoskeletal: She exhibits no edema, tenderness or deformity.  Good PROM of BUE  Neurological: She is alert. No cranial nerve deficit (grossly intact).  Skin: Skin is warm and dry. Rash noted. She is not diaphoretic.  Left axilla with erythema and induration that  extends down bicep region and also medially across pec region. Area of fluctuance noted in anterior aspect of axilla. No TTP  Psychiatric: Mood and affect normal.  Nursing note and vitals reviewed.   Results for orders placed or performed during the hospital encounter of 10/21/16 (from the past 48 hour(s))  CBC with Differential     Status: Abnormal   Collection Time: 10/21/16  1:45 PM  Result Value Ref Range   WBC 6.1 4.0 - 10.5 K/uL   RBC 3.56 (L) 3.87 - 5.11 MIL/uL    Hemoglobin 10.1 (L) 12.0 - 15.0 g/dL   HCT 31.8 (L) 36.0 - 46.0 %   MCV 89.3 78.0 - 100.0 fL   MCH 28.4 26.0 - 34.0 pg   MCHC 31.8 30.0 - 36.0 g/dL   RDW 14.6 11.5 - 15.5 %   Platelets 296 150 - 400 K/uL   Neutrophils Relative % 70 %   Neutro Abs 4.2 1.7 - 7.7 K/uL   Lymphocytes Relative 23 %   Lymphs Abs 1.4 0.7 - 4.0 K/uL   Monocytes Relative 5 %   Monocytes Absolute 0.3 0.1 - 1.0 K/uL   Eosinophils Relative 2 %   Eosinophils Absolute 0.1 0.0 - 0.7 K/uL   Basophils Relative 0 %   Basophils Absolute 0.0 0.0 - 0.1 K/uL  Basic metabolic panel     Status: Abnormal   Collection Time: 10/21/16  1:45 PM  Result Value Ref Range   Sodium 150 (H) 135 - 145 mmol/L   Potassium 5.0 3.5 - 5.1 mmol/L   Chloride 120 (H) 101 - 111 mmol/L   CO2 24 22 - 32 mmol/L   Glucose, Bld 245 (H) 65 - 99 mg/dL   BUN 48 (H) 6 - 20 mg/dL   Creatinine, Ser 1.50 (H) 0.44 - 1.00 mg/dL   Calcium 9.4 8.9 - 10.3 mg/dL   GFR calc non Af Amer 30 (L) >60 mL/min   GFR calc Af Amer 35 (L) >60 mL/min    Comment: (NOTE) The eGFR has been calculated using the CKD EPI equation. This calculation has not been validated in all clinical situations. eGFR's persistently <60 mL/min signify possible Chronic Kidney Disease.    Anion gap 6 5 - 15  I-Stat CG4 Lactic Acid, ED     Status: None   Collection Time: 10/21/16  1:56 PM  Result Value Ref Range   Lactic Acid, Venous 1.43 0.5 - 1.9 mmol/L   No results found.    Assessment/Plan  Abscess of left axilla - will likely need OR I&D, will discuss with MD - admit to medicine   We will continue to follow this pt. Thank you for the consult.   Kalman Drape, [facility] 10/21/2016, 3:51 PM Pager: [phone] Consults: [phone] Mon-Fri 7:00 am-4:30 pm Sat-Sun 7:00 am-11:30 am

## 2016-10-22 NOTE — Transfer of Care (Signed)
Immediate Anesthesia Transfer of Care Note  Patient: Cynthia [name]  Procedure(s) Performed: Procedure(s): INCISION AND DRAINAGE LEFT AXILLARY  ABSCESS (Left)  Patient Location: PACU  Anesthesia Type:General  Level of Consciousness: sedated  Airway & Oxygen Therapy: Patient Spontanous Breathing and Patient connected to face mask oxygen  Post-op Assessment: Report given to RN and Post -op Vital signs reviewed and stable  Post vital signs: Reviewed and stable  Last Vitals:  Vitals:   10/22/16 0552 10/22/16 0835  BP: 135/62 139/68  Pulse: 63   Resp: 20 10  Temp: 36.7 C   SpO2: 100%     Last Pain:  Vitals:   10/22/16 0552  TempSrc: Oral  PainSc:          Complications: No apparent anesthesia complications

## 2016-10-22 NOTE — Op Note (Signed)
Preoperative Diagnosis: Abscess left axilla  Postoprative Diagnosis: Hematoma left axilla  Procedure: Procedure(s): Evacuation hematoma left axilla   Surgeon: Glenna FellowsHoxworth, Shaheen Star T   Assistants: none  Anesthesia:  General LMA anesthesia  Indications: patient is an 83 year old female with significant dementia who cannot provide a history. Her daughter states that she developed within the last 24 hours in area of painful swelling in her left axilla. Exam reveals an area of erythema induration and central fluctuance in the anterior left axilla consistent with abscess. With these findings we recommended incision and drainage under anesthesia. I discussed with the daughter the nature of the surgery and indications and risks of anesthetic complications, bleeding, infection and wound healing problems and she agreed to proceed.    Procedure Detail:  Patient was brought to the operating room, placed in supine position on the operating table and laryngeal mask anesthesia induced. She was already on IV antibiotics. The left axilla was widely sterilely prepped and draped. Patient timeout was performed and correct procedure verified. I initially aspirated the central area of induration and slight fluctuance and did not obtain any purulent material. I made an approximately 1-1/2 cm incision centrally in the area and dissection was carried down through the subcutaneous tissue. I then entered a definite cavity with careful blunt dissection but there was no purulence. However there was organized thrombus.This was evacuated with suction and digitally. Some of this was sent for pathology. This was a fairly discrete cavity anteriorly in the axilla and extending just out into the anterior upper arm. There was no active bleeding. This clot was completely evacuated. Cultures were taken. The wound was irrigated. Hemostasis was obtained.I loosely packed the cavity with moist sterile saline gauze and dry sterile dressing was  applied. Sponge needle and instrument counts were correct.    Findings: Left axillary hematoma  Estimated Blood Loss:  Minimal         Drains: wound packed with moist saline gauze  Blood Given: none          Specimens: #1 culture and sensitivity   #2 probable thrombus        Complications:  * No complications entered in OR log *         Disposition: PACU - hemodynamically stable.         Condition: stable

## 2016-10-22 NOTE — Interval H&P Note (Signed)
History and Physical Interval Note:  10/22/2016 7:27 AM  Cynthia [name]  has presented today for surgery, with the diagnosis of left axillary abscess  The various methods of treatment have been discussed with the patient and family. After consideration of risks, benefits and other options for treatment, the patient has consented to  Procedure(s): INCISION AND DRAINAGE LEFT AXILLARY  ABSCESS (Left) as a surgical intervention .  The patient's history has been reviewed, patient examined, no change in status, stable for surgery.  I have reviewed the patient's chart and labs.  Questions were answered to the patient's satisfaction.     Kyel Purk T

## 2016-10-22 NOTE — Anesthesia Procedure Notes (Signed)
Procedure Name: LMA Insertion Date/Time: 10/22/2016 7:45 AM Performed by: Minerva EndsMIRARCHI, Quante Pettry M Pre-anesthesia Checklist: Patient identified, Emergency Drugs available, Suction available and Patient being monitored Patient Re-evaluated:Patient Re-evaluated prior to induction Oxygen Delivery Method: Circle System Utilized Preoxygenation: Pre-oxygenation with 100% oxygen Induction Type: IV induction Ventilation: Mask ventilation without difficulty LMA: LMA inserted LMA Size: 4.0 Number of attempts: 1 Placement Confirmation: positive ETCO2 Tube secured with: Tape Dental Injury: Teeth and Oropharynx as per pre-operative assessment  Comments: IV induction Carignan-- LMA AM CRNA atraumatic--- many missing and loose front teeth on right side-- daughter did not report loose teeth on interview-- bilat BS Carignan

## 2016-10-22 NOTE — Anesthesia Procedure Notes (Signed)
Date/Time: 10/22/2016 8:23 AM Performed by: Minerva EndsMIRARCHI, Chevy Virgo M Oxygen Delivery Method: Simple face mask Placement Confirmation: breath sounds checked- equal and bilateral and positive ETCO2 Dental Injury: Teeth and Oropharynx as per pre-operative assessment  Comments: LMA removed --- good AW--- mask O2 to pacu with O2 intact

## 2016-10-22 NOTE — Progress Notes (Signed)
PROGRESS NOTE  Cynthia [name] ZOX:[id number] DOB: Nov 09, 1927 DOA: 10/21/2016 PCP: Kirt Boys, DO   LOS: 0 days   Brief Narrative / Interim history: 83 y.o. female with medical history significant of advanced dementia with behavioral disturbances, hypothyroidism, prior recurrent DVT, insulin-dependent diabetes mellitus, hypertension, who is being brought to the emergency room from her nursing home due to left axillary swelling  Assessment & Plan: Active Problems:   Hypothyroidism due to acquired atrophy of thyroid   Dementia with behavioral disturbance   Type 2 diabetes mellitus with stage 4 chronic kidney disease, with long-term current use of insulin (HCC)   Hyperlipidemia associated with type 2 diabetes mellitus (HCC)   Hypertension associated with diabetes (HCC)   Depression with anxiety   Abscess   Hypernatremia   Left axillary abscess -General surgery consulted, patient was taken to the operating room this morning underwent an I&D and status post evacuation.  Apparently there was no abscess but it was an organized hematoma.  Cultures were sent.  She was started empirically on antibiotics, continue while cultures are pending  Type 2 diabetes mellitus -Continue home long-acting insulin, continue sliding scale, hold oral agents  Hypertension -Resume home medications  Hyponatremia -Due to poor p.o. intake and dehydration, on fluids, sodium improved from 150 on admission to 149 today.  Chronic kidney disease stage IV -Currently creatinine appears at baseline  Hypothyroidism -Continue Synthroid  History of recurrent DVTs -Most recent DVT was about 2 years ago per Dr.  Carlena Hurl was on hold given plan for operative intervention to her axilla.  Resume anticoagulation / prophylaxis per general surgery   DVT prophylaxis: SCD Code Status: DNR Family Communication: no family at bedside Disposition Plan: SNF 2-3 days  Consultants:   General surgery   Procedures:    I&D left axilla 8/14  Antimicrobials:  Clindamycin 8/13 >>   Subjective: -advanced dementia, no complaints  Objective: Vitals:   10/22/16 0900 10/22/16 0915 10/22/16 0930 10/22/16 0959  BP: 131/62 127/64 133/62 128/70  Pulse: 75 77 77 75  Resp: (!) 9 10 11 14   Temp:   (!) 97.5 F (36.4 C) 97.6 F (36.4 C)  TempSrc:      SpO2: 100% 100% 98% 100%  Weight:      Height:        Intake/Output Summary (Last 24 hours) at 10/22/16 1200 Last data filed at 10/22/16 0930  Gross per 24 hour  Intake             1025 ml  Output              370 ml  Net              655 ml   Filed Weights   10/22/16 0552  Weight: 72.6 kg (160 lb 0.9 oz)    Examination:  Vitals:   10/22/16 0900 10/22/16 0915 10/22/16 0930 10/22/16 0959  BP: 131/62 127/64 133/62 128/70  Pulse: 75 77 77 75  Resp: (!) 9 10 11 14   Temp:   (!) 97.5 F (36.4 C) 97.6 F (36.4 C)  TempSrc:      SpO2: 100% 100% 98% 100%  Weight:      Height:        Constitutional: NAD Eyes: lids and conjunctivae normal ENMT: Mucous membranes are moist.  Respiratory: clear to auscultation bilaterally, no wheezing, no crackles. Normal respiratory effort.  Cardiovascular: Regular rate and rhythm, no murmurs / rubs / gallops. No LE edema. 2+ pedal pulses.  Abdomen:  no tenderness. Bowel sounds positive.  Skin: dressing left axillary region, CDI  Data Reviewed: I have independently reviewed following labs and imaging studies  CBC:  Recent Labs Lab 10/21/16 1345 10/22/16 0437  WBC 6.1 5.8  NEUTROABS 4.2  --   HGB 10.1* 9.7*  HCT 31.8* 30.8*  MCV 89.3 89.8  PLT 296 280   Basic Metabolic Panel:  Recent Labs Lab 10/21/16 1345 10/22/16 0437  NA 150* 149*  K 5.0 4.6  CL 120* 117*  CO2 24 25  GLUCOSE 245* 204*  BUN 48* 36*  CREATININE 1.50* 1.32*  CALCIUM 9.4 9.0   GFR: Estimated Creatinine Clearance: 28.7 mL/min (A) (by C-G formula based on SCr of 1.32 mg/dL (H)). Liver Function Tests:  Recent Labs Lab  10/22/16 0437  AST 14*  ALT 16  ALKPHOS 88  BILITOT 0.5  PROT 6.8  ALBUMIN 3.1*   No results for input(s): LIPASE, AMYLASE in the last 168 hours. No results for input(s): AMMONIA in the last 168 hours. Coagulation Profile: No results for input(s): INR, PROTIME in the last 168 hours. Cardiac Enzymes: No results for input(s): CKTOTAL, CKMB, CKMBINDEX, TROPONINI in the last 168 hours. BNP (last 3 results) No results for input(s): PROBNP in the last 8760 hours. HbA1C: No results for input(s): HGBA1C in the last 72 hours. CBG:  Recent Labs Lab 10/21/16 2043 10/22/16 0705 10/22/16 0840  GLUCAP 131* 155* 146*   Lipid Profile: No results for input(s): CHOL, HDL, LDLCALC, TRIG, CHOLHDL, LDLDIRECT in the last 72 hours. Thyroid Function Tests: No results for input(s): TSH, T4TOTAL, FREET4, T3FREE, THYROIDAB in the last 72 hours. Anemia Panel: No results for input(s): VITAMINB12, FOLATE, FERRITIN, TIBC, IRON, RETICCTPCT in the last 72 hours. Urine analysis:    Component Value Date/Time   COLORURINE YELLOW 06/26/2015 1110   APPEARANCEUR CLEAR 06/26/2015 1110   LABSPEC 1.024 06/26/2015 1110   PHURINE 6.0 06/26/2015 1110   GLUCOSEU >1000 (A) 06/26/2015 1110   HGBUR NEGATIVE 06/26/2015 1110   BILIRUBINUR NEGATIVE 06/26/2015 1110   KETONESUR NEGATIVE 06/26/2015 1110   PROTEINUR NEGATIVE 06/26/2015 1110   NITRITE POSITIVE (A) 06/26/2015 1110   LEUKOCYTESUR NEGATIVE 06/26/2015 1110   Sepsis Labs: Invalid input(s): PROCALCITONIN, LACTICIDVEN  Recent Results (from the past 240 hour(s))  MRSA PCR Screening     Status: None   Collection Time: 10/21/16  8:32 PM  Result Value Ref Range Status   MRSA by PCR NEGATIVE NEGATIVE Final    Comment:        The GeneXpert MRSA Assay (FDA approved for NASAL specimens only), is one component of a comprehensive MRSA colonization surveillance program. It is not intended to diagnose MRSA infection nor to guide or monitor treatment for MRSA  infections.       Radiology Studies: No results found.   Scheduled Meds: . amLODipine  5 mg Oral Q breakfast  . memantine  28 mg Oral Daily   And  . donepezil  10 mg Oral Daily  . insulin aspart  0-9 Units Subcutaneous TID WC  . insulin detemir  5 Units Subcutaneous QHS  . levothyroxine  137 mcg Oral QAC breakfast  . LORazepam  0.5 mg Oral QHS  . QUEtiapine  25 mg Oral Daily   And  . QUEtiapine  50 mg Oral QHS  . sertraline  100 mg Oral Daily   Continuous Infusions: . clindamycin (CLEOCIN) IV Stopped (10/22/16 16100621)    Cynthia Pertostin Gherghe, MD, PhD [facility] Pager [phone]  If  7PM-7AM, please contact night-coverage [email or web address] Password [email or web address] 10/22/2016, 12:00 PM

## 2016-10-22 NOTE — Anesthesia Postprocedure Evaluation (Signed)
Anesthesia Post Note  Patient: Cynthia [name]  Procedure(s) Performed: Procedure(s) (LRB): INCISION AND DRAINAGE LEFT AXILLARY  ABSCESS (Left)     Patient location during evaluation: PACU Anesthesia Type: General Level of consciousness: awake and alert Pain management: pain level controlled Vital Signs Assessment: post-procedure vital signs reviewed and stable Respiratory status: spontaneous breathing, nonlabored ventilation, respiratory function stable and patient connected to nasal cannula oxygen Cardiovascular status: blood pressure returned to baseline and stable Postop Assessment: no signs of nausea or vomiting Anesthetic complications: no    Last Vitals:  Vitals:   10/22/16 0930 10/22/16 0959  BP: 133/62 128/70  Pulse: 77 75  Resp: 11 14  Temp: (!) 36.4 C 36.4 C  SpO2: 98% 100%    Last Pain:  Vitals:   10/22/16 0930  TempSrc:   PainSc: Asleep                 Phillips Groutarignan, Dean Goldner

## 2016-10-23 ENCOUNTER — Non-Acute Institutional Stay (SKILLED_NURSING_FACILITY): Payer: Medicare Other | Admitting: Adult Health

## 2016-10-23 ENCOUNTER — Encounter: Payer: Self-pay | Admitting: Adult Health

## 2016-10-23 DIAGNOSIS — E034 Atrophy of thyroid (acquired): Secondary | ICD-10-CM

## 2016-10-23 DIAGNOSIS — E1159 Type 2 diabetes mellitus with other circulatory complications: Secondary | ICD-10-CM

## 2016-10-23 DIAGNOSIS — E785 Hyperlipidemia, unspecified: Secondary | ICD-10-CM

## 2016-10-23 DIAGNOSIS — F0151 Vascular dementia with behavioral disturbance: Secondary | ICD-10-CM

## 2016-10-23 DIAGNOSIS — F01518 Vascular dementia, unspecified severity, with other behavioral disturbance: Secondary | ICD-10-CM

## 2016-10-23 DIAGNOSIS — L0291 Cutaneous abscess, unspecified: Secondary | ICD-10-CM

## 2016-10-23 DIAGNOSIS — N184 Chronic kidney disease, stage 4 (severe): Secondary | ICD-10-CM

## 2016-10-23 DIAGNOSIS — S40022S Contusion of left upper arm, sequela: Secondary | ICD-10-CM | POA: Insufficient documentation

## 2016-10-23 DIAGNOSIS — Z794 Long term (current) use of insulin: Secondary | ICD-10-CM

## 2016-10-23 DIAGNOSIS — K219 Gastro-esophageal reflux disease without esophagitis: Secondary | ICD-10-CM

## 2016-10-23 DIAGNOSIS — E1169 Type 2 diabetes mellitus with other specified complication: Secondary | ICD-10-CM

## 2016-10-23 DIAGNOSIS — E870 Hyperosmolality and hypernatremia: Secondary | ICD-10-CM

## 2016-10-23 DIAGNOSIS — E1122 Type 2 diabetes mellitus with diabetic chronic kidney disease: Secondary | ICD-10-CM | POA: Diagnosis not present

## 2016-10-23 DIAGNOSIS — I10 Essential (primary) hypertension: Secondary | ICD-10-CM

## 2016-10-23 LAB — BASIC METABOLIC PANEL
ANION GAP: 7 (ref 5–15)
BUN: 45 mg/dL — ABNORMAL HIGH (ref 6–20)
CALCIUM: 8.3 mg/dL — AB (ref 8.9–10.3)
CO2: 24 mmol/L (ref 22–32)
CREATININE: 1.93 mg/dL — AB (ref 0.44–1.00)
Chloride: 113 mmol/L — ABNORMAL HIGH (ref 101–111)
GFR, EST AFRICAN AMERICAN: 26 mL/min — AB (ref >60)
GFR, EST NON AFRICAN AMERICAN: 22 mL/min — AB (ref >60)
Glucose, Bld: 223 mg/dL — ABNORMAL HIGH (ref 65–99)
Potassium: 4.8 mmol/L (ref 3.5–5.1)
SODIUM: 144 mmol/L (ref 135–145)

## 2016-10-23 LAB — CBC
HCT: 27.3 % — ABNORMAL LOW (ref 36.0–46.0)
Hemoglobin: 8.6 g/dL — ABNORMAL LOW (ref 12.0–15.0)
MCH: 29.2 pg (ref 26.0–34.0)
MCHC: 31.5 g/dL (ref 30.0–36.0)
MCV: 92.5 fL (ref 78.0–100.0)
PLATELETS: 261 10*3/uL (ref 150–400)
RBC: 2.95 MIL/uL — AB (ref 3.87–5.11)
RDW: 15.2 % (ref 11.5–15.5)
WBC: 5.4 10*3/uL (ref 4.0–10.5)

## 2016-10-23 LAB — GLUCOSE, CAPILLARY: GLUCOSE-CAPILLARY: 157 mg/dL — AB (ref 65–99)

## 2016-10-23 MED ORDER — LORAZEPAM 0.5 MG PO TABS: 0.5000 mg | ORAL_TABLET | Freq: Every day | ORAL | 0 refills | Status: DC

## 2016-10-23 MED ORDER — CLINDAMYCIN HCL 300 MG PO CAPS
600.0000 mg | ORAL_CAPSULE | Freq: Three times a day (TID) | ORAL | 0 refills | Status: AC
Start: 2016-10-23 — End: 2016-11-02

## 2016-10-23 MED ORDER — SODIUM CHLORIDE 0.9 % IV SOLN
INTRAVENOUS | Status: DC
  Administered 2016-10-23: 10:00:00 via INTRAVENOUS

## 2016-10-23 MED ORDER — LORAZEPAM 0.5 MG PO TABS: 1.0000 mg | ORAL_TABLET | Freq: Every day | ORAL | 0 refills | Status: DC

## 2016-10-23 NOTE — Discharge Summary (Signed)
PATIENT DETAILS Name: Cynthia [name] Age: 83 y.o. Sex: female Date of Birth: 15-Jul-1927 MRN: [id number]. Admitting Physician: Leatha Gilding, MD WGN:FAOZHY, [place], [place]  Admit Date: 10/21/2016 Discharge date: 10/23/2016  Recommendations for Outpatient Follow-up:  1. Follow up with PCP in 1-2 weeks 2. Please obtain BMP/CBC later this week 3. Please follow up on the following pending results: Intraoperative cultures done on 8/14 4. Lisinopril and HCTZ on hold-resume if renal function improves in the next few days.  Admitted From:  SNF   Disposition: SNF    Home Health: No  Equipment/Devices: None  Discharge Condition: Stable  CODE STATUS:  DNR  Diet recommendation:  Heart Healthy / Carb Modified  Brief Summary: See H&P, Labs, Consult and Test reports for all details in brief, 83 y.o.femalewith medical history significant of advanced dementia with behavioral disturbances, hypothyroidism, prior recurrent DVT, insulin-dependent diabetes mellitus, hypertension, who is being brought to the emergency room from her nursing home due to left axillary swelling. Evaluated by general surgery, initially thought to have a abscess, but upon I&D this was more consistent with a hematoma. See below for further details.  Brief Hospital Course: Left axillary hematoma: -General surgery consulted, patient was taken to the operating room this morning underwent an I&D and status post evacuation.  Apparently there was no abscess but it was an organized hematoma.  Cultures were sent-and are still pending. Spoke with general surgery Dr. Johna Sheriff, recommendations are to empirically cover with antibiotics for a few more days as cultures are still pending. No further recommendations from general surgery, okay to discharge activity SNF.    Acute kidney injury on chronic kidney disease stage IV:  Mild bump in creatinine today-while she is to be in the hospital we will provide her some gentle  hydration. We will stop her lisinopril and HCTZ, daughter is okay with her going back to SNF today, plans are to recheck electrolytes in the next few days at [facility]. Even if she has significant worsening in the renal function, daughter is aware that given her advanced dementia, frailty patient is a poor long term candidate for renal replacement therapy.  Type 2 diabetes mellitus -Continue home long-acting insulin, continue sliding scale, resume oral agents on discharge, optimize at SNF  Hypertension -Resume home medications-with the exception of HCTZ and lisinopril given slight bump in the creatinine. Recheck electrolytes in the next few days and resume if able.  Hypernatremia -Due to poor p.o. intake and dehydration, on fluids, resolved with gentle hydration. Continue to follow electrolytes   Hypothyroidism -Continue Synthroid  History of recurrent DVTs -Most recent DVT was about 2 years ago per Dr.  Carlena Hurl was on hold given plan for operative intervention to her axilla.  Resume anticoagulation on discharge  Goals of care/ethics: A frail 83 year old who was usually bed bound-with advanced dementia-admitted with left axillary hematoma. Has some chronic medical problems including some worsening of her underlying chronic kidney disease. Clearly not a candidate for hemodialysis. If she were to deteriorate in the future, consider palliative care evaluation. She is a DO NOT RESUSCITATE.  Procedures/Studies: Left axillary incision and drainage on 8/14  Discharge Diagnoses:  Active Problems:   Hypothyroidism due to acquired atrophy of thyroid   Dementia with behavioral disturbance   Type 2 diabetes mellitus with stage 4 chronic kidney disease, with long-term current use of insulin (HCC)   Hyperlipidemia associated with type 2 diabetes mellitus (HCC)   Hypertension associated with diabetes (HCC)   Depression with anxiety  Abscess   Hypernatremia   Discharge Instructions:  Activity:    As tolerated with Full fall precautions use walker/cane & assistance as needed   Discharge Instructions    Diet - low sodium heart healthy    Complete by:  As directed    Discharge wound care:    Complete by:  As directed    Wet gauge packing daily   Increase activity slowly    Complete by:  As directed      Allergies as of 10/23/2016   No Known Allergies     Medication List    STOP taking these medications   hydrochlorothiazide 25 MG tablet Commonly known as:  HYDRODIURIL   lisinopril 10 MG tablet Commonly known as:  PRINIVIL,ZESTRIL     TAKE these medications   Acetaminophen 500 MG coapsule Take 500 mg by mouth 3 (three) times daily.   Acetaminophen 500 MG coapsule Take 500 mg by mouth every 6 (six) hours as needed for fever (headache and minor discomfort).   amLODipine 10 MG tablet Commonly known as:  NORVASC Take 10 mg by mouth daily.   clindamycin 300 MG capsule Commonly known as:  CLEOCIN Take 2 capsules (600 mg total) by mouth 3 (three) times daily. For 4 more days from 8/15   insulin aspart 100 UNIT/ML injection Commonly known as:  novoLOG Inject 3 Units into the skin 3 (three) times daily before meals. Give before meals for blood sugar > 200.  Notify MD is blood sugar is less than 70 or greater than 450.  Hold Insulin if less than 70   insulin detemir 100 UNIT/ML injection Commonly known as:  LEVEMIR Inject 5 Units into the skin at bedtime.   levothyroxine 137 MCG tablet Commonly known as:  SYNTHROID, LEVOTHROID Take 137 mcg by mouth daily.   linagliptin 5 MG Tabs tablet Commonly known as:  TRADJENTA Take 1 tablet (5 mg total) by mouth daily.   loperamide 2 MG capsule Commonly known as:  IMODIUM Take 2 mg by mouth every 3 (three) hours as needed for diarrhea or loose stools.   LORazepam 0.5 MG tablet Commonly known as:  ATIVAN Take 2 tablets (1 mg total) by mouth at bedtime. Take two tablets by mouth every evening for anxiety What  changed:  how much to take  how to take this  when to take this   LORazepam 0.5 MG tablet Commonly known as:  ATIVAN Take 1 tablet (0.5 mg total) by mouth daily. What changed:  Another medication with the same name was changed. Make sure you understand how and when to take each.   magnesium hydroxide 400 MG/5ML suspension Commonly known as:  MILK OF MAGNESIA Take 30 mLs by mouth at bedtime as needed for mild constipation.   NAMZARIC 28-10 MG Cp24 Generic drug:  Memantine HCl-Donepezil HCl Take 1 capsule by mouth daily.   omeprazole 20 MG capsule Commonly known as:  PRILOSEC Take 20 mg by mouth daily.   ondansetron 8 MG disintegrating tablet Commonly known as:  ZOFRAN-ODT Take 8 mg by mouth every 8 (eight) hours as needed for nausea or vomiting.   QUEtiapine 50 MG tablet Commonly known as:  SEROQUEL Take 25-50 mg by mouth 2 (two) times daily. Take 25 mg in the morning and 50 mg at bedtime   sertraline 100 MG tablet Commonly known as:  ZOLOFT Take 100 mg by mouth daily.   vitamin B-12 1000 MCG tablet Commonly known as:  CYANOCOBALAMIN Take 1,000 mcg by  mouth daily.   XARELTO 20 MG Tabs tablet Generic drug:  rivaroxaban Take 20 mg by mouth daily.      Contact information for after-discharge care    Destination    [facility] Follow up.   Specialty:  Skilled Nursing Facility Contact information: [address] [place] [address] [phone]             No Known Allergies   Consultations:   general surgery   Other Procedures/Studies: No results found.   TODAY-DAY OF DISCHARGE:  Subjective:   Cynthia [name] todayIs lying in bed comfortably, she is pleasantly confused.  Objective:   Blood pressure 133/62, pulse 67, temperature 97.6 F (36.4 C), temperature source Oral, resp. rate 16, height 5\' 3"  (1.6 m), weight 72.6 kg (160 lb 0.9 oz), SpO2 92 %.  Intake/Output Summary (Last 24 hours) at  10/23/16 1029 Last data filed at 10/23/16 0909  Gross per 24 hour  Intake              510 ml  Output              425 ml  Net               85 ml   Filed Weights   10/22/16 0552  Weight: 72.6 kg (160 lb 0.9 oz)    Exam: Awake But pleasantly confused .AT,PERRAL Supple Neck,No JVD, No cervical lymphadenopathy appriciated.  Symmetrical Chest wall movement, Good air movement bilaterally, CTAB RRR,No Gallops,Rubs or new Murmurs, No Parasternal Heave +ve B.Sounds, Abd Soft, Non tender, No organomegaly appriciated, No rebound -guarding or rigidity. No Cyanosis, Clubbing or edema, No new Rash or bruise   PERTINENT RADIOLOGIC STUDIES: No results found.   PERTINENT LAB RESULTS: CBC:  Recent Labs  10/22/16 0437 10/23/16 0422  WBC 5.8 5.4  HGB 9.7* 8.6*  HCT 30.8* 27.3*  PLT 280 261   CMET CMP     Component Value Date/Time   NA 144 10/23/2016 0422   NA 145 08/20/2016   K 4.8 10/23/2016 0422   CL 113 (H) 10/23/2016 0422   CO2 24 10/23/2016 0422   GLUCOSE 223 (H) 10/23/2016 0422   BUN 45 (H) 10/23/2016 0422   BUN 42 (A) 08/20/2016   CREATININE 1.93 (H) 10/23/2016 0422   CALCIUM 8.3 (L) 10/23/2016 0422   PROT 6.8 10/22/2016 0437   ALBUMIN 3.1 (L) 10/22/2016 0437   AST 14 (L) 10/22/2016 0437   ALT 16 10/22/2016 0437   ALKPHOS 88 10/22/2016 0437   BILITOT 0.5 10/22/2016 0437   GFRNONAA 22 (L) 10/23/2016 0422   GFRAA 26 (L) 10/23/2016 0422    GFR Estimated Creatinine Clearance: 19.6 mL/min (A) (by C-G formula based on SCr of 1.93 mg/dL (H)). No results for input(s): LIPASE, AMYLASE in the last 72 hours. No results for input(s): CKTOTAL, CKMB, CKMBINDEX, TROPONINI in the last 72 hours. Invalid input(s): POCBNP No results for input(s): DDIMER in the last 72 hours. No results for input(s): HGBA1C in the last 72 hours. No results for input(s): CHOL, HDL, LDLCALC, TRIG, CHOLHDL, LDLDIRECT in the last 72 hours. No results for input(s): TSH, T4TOTAL, T3FREE, THYROIDAB  in the last 72 hours.  Invalid input(s): FREET3 No results for input(s): VITAMINB12, FOLATE, FERRITIN, TIBC, IRON, RETICCTPCT in the last 72 hours. Coags: No results for input(s): INR in the last 72 hours.  Invalid input(s): PT Microbiology: Recent Results (from the past 240 hour(s))  MRSA PCR Screening  Status: None   Collection Time: 10/21/16  8:32 PM  Result Value Ref Range Status   MRSA by PCR NEGATIVE NEGATIVE Final    Comment:        The GeneXpert MRSA Assay (FDA approved for NASAL specimens only), is one component of a comprehensive MRSA colonization surveillance program. It is not intended to diagnose MRSA infection nor to guide or monitor treatment for MRSA infections.   Aerobic/Anaerobic Culture (surgical/deep wound)     Status: None (Preliminary result)   Collection Time: 10/22/16  8:02 AM  Result Value Ref Range Status   Specimen Description ABSCESS  Final   Special Requests NONE  Final   Gram Stain   Final    NO WBC SEEN NO ORGANISMS SEEN Performed at [facility], [address] [place], [place] [address]    Culture PENDING  Incomplete   Report Status PENDING  Incomplete    FURTHER DISCHARGE INSTRUCTIONS:  Get Medicines reviewed and adjusted: Please take all your medications with you for your next visit with your Primary MD  Laboratory/radiological data: Please request your Primary MD to go over all hospital tests and procedure/radiological results at the follow up, please ask your Primary MD to get all Hospital records sent to his/her office.  In some cases, they will be blood work, cultures and biopsy results pending at the time of your discharge. Please request that your primary care M.D. goes through all the records of your hospital data and follows up on these results.  Also Note the following: If you experience worsening of your admission symptoms, develop shortness of breath, life threatening emergency, suicidal or homicidal thoughts  you must seek medical attention immediately by calling 911 or calling your MD immediately  if symptoms less severe.  You must read complete instructions/literature along with all the possible adverse reactions/side effects for all the Medicines you take and that have been prescribed to you. Take any new Medicines after you have completely understood and accpet all the possible adverse reactions/side effects.   Do not drive when taking Pain medications or sleeping medications (Benzodaizepines)  Do not take more than prescribed Pain, Sleep and Anxiety Medications. It is not advisable to combine anxiety,sleep and pain medications without talking with your primary care practitioner  Special Instructions: If you have smoked or chewed Tobacco  in the last 2 yrs please stop smoking, stop any regular Alcohol  and or any Recreational drug use.  Wear Seat belts while driving.  Please note: You were cared for by a hospitalist during your hospital stay. Once you are discharged, your primary care physician will handle any further medical issues. Please note that NO REFILLS for any discharge medications will be authorized once you are discharged, as it is imperative that you return to your primary care physician (or establish a relationship with a primary care physician if you do not have one) for your post hospital discharge needs so that they can reassess your need for medications and monitor your lab values.  Total Time spent coordinating discharge including counseling, education and face to face time equals 45 minutes.  SignedJeoffrey Massed 10/23/2016 10:29 AM

## 2016-10-23 NOTE — Progress Notes (Signed)
Location:   [facility] Room Number: 202 B Place of Service:  SNF (31)   CODE STATUS: DNR  No Known Allergies  Chief Complaint  Patient presents with  . Hospitalization Follow-up    Hospital Follow up    HPI:  She is a long term resident who has been hospitalized for a left axilla hematoma. She had a surgical I?d and evacuation. She also had acute on chronic renal disease stage IV she was given gentle hydration with her lisinopril and hctz were held due to her renal function. She is unable to participate in the hpi or ros. There are no nursing concerns at this time.    Past Medical History:  Diagnosis Date  . CKD (chronic kidney disease)   . Dementia   . Diabetes mellitus without complication (HCC)   . Hypertension   . Hypothyroid   . Hypothyroid   . Thyroid disease     Past Surgical History:  Procedure Laterality Date  . ABDOMINAL HYSTERECTOMY    . FRACTURE SURGERY    . INCISION AND DRAINAGE ABSCESS Left 10/22/2016   Procedure: INCISION AND DRAINAGE LEFT AXILLARY  ABSCESS;  Surgeon: Glenna FellowsHoxworth, Benjamin, MD;  Location: [facility];  Service: General;  Laterality: Left;    Social History   Social History  . Marital status: Widowed    Spouse name: N/A  . Number of children: N/A  . Years of education: N/A   Occupational History  . Not on file.   Social History Main Topics  . Smoking status: Never Smoker  . Smokeless tobacco: Never Used  . Alcohol use No  . Drug use: No  . Sexual activity: Not Currently   Other Topics Concern  . Not on file   Social History Narrative  . No narrative on file   History reviewed. No pertinent family history.    VITAL SIGNS BP (!) 147/59   Pulse 72   Temp 97.6 F (36.4 C)   Resp 16   Ht 5\' 3"  (1.6 m)   Wt 160 lb 1.6 oz (72.6 kg)   SpO2 96%   BMI 28.36 kg/m   Patient's Medications  New Prescriptions   No medications on file  Previous Medications   ACETAMINOPHEN 500 MG COAPSULE    Take 500 mg by mouth 3  (three) times daily.   ACETAMINOPHEN 500 MG COAPSULE    Take 500 mg by mouth every 6 (six) hours as needed for fever (headache and minor discomfort).   AMLODIPINE (NORVASC) 10 MG TABLET    Take 10 mg by mouth daily.   CLINDAMYCIN (CLEOCIN) 300 MG CAPSULE    Take 2 capsules (600 mg total) by mouth 3 (three) times daily. For 4 more days from 8/15   INSULIN ASPART (NOVOLOG) 100 UNIT/ML INJECTION    Inject 3 Units into the skin 3 (three) times daily before meals. Give before meals for blood sugar > 200.  Notify MD is blood sugar is less than 70 or greater than 450.  Hold Insulin if less than 70   INSULIN DETEMIR (LEVEMIR) 100 UNIT/ML INJECTION    Inject 5 Units into the skin at bedtime.   LEVOTHYROXINE (SYNTHROID, LEVOTHROID) 137 MCG TABLET    Take 137 mcg by mouth daily.    LINAGLIPTIN (TRADJENTA) 5 MG TABS TABLET    Take 1 tablet (5 mg total) by mouth daily.   LOPERAMIDE (IMODIUM) 2 MG CAPSULE    Take 2 mg by mouth every 3 (three) hours as  needed for diarrhea or loose stools.   LORAZEPAM (ATIVAN) 0.5 MG TABLET    Take 2 tablets (1 mg total) by mouth at bedtime. Take two tablets by mouth every evening for anxiety   LORAZEPAM (ATIVAN) 0.5 MG TABLET    Take 1 tablet (0.5 mg total) by mouth daily.   MAGNESIUM HYDROXIDE (MILK OF MAGNESIA) 400 MG/5ML SUSPENSION    Take 30 mLs by mouth at bedtime as needed for mild constipation.   NAMZARIC 28-10 MG CP24    Take 1 capsule by mouth daily.   OMEPRAZOLE (PRILOSEC) 20 MG CAPSULE    Take 20 mg by mouth daily.   ONDANSETRON (ZOFRAN-ODT) 8 MG DISINTEGRATING TABLET    Take 8 mg by mouth every 8 (eight) hours as needed for nausea or vomiting.   QUETIAPINE (SEROQUEL) 50 MG TABLET    Take 25-50 mg by mouth 2 (two) times daily. Take 25 mg in the morning and 50 mg at bedtime   SERTRALINE (ZOLOFT) 100 MG TABLET    Take 100 mg by mouth daily.   VITAMIN B-12 (CYANOCOBALAMIN) 1000 MCG TABLET    Take 1,000 mcg by mouth daily.   XARELTO 20 MG TABS TABLET    Take 20 mg by mouth  daily.  Modified Medications   No medications on file  Discontinued Medications   No medications on file     SIGNIFICANT DIAGNOSTIC EXAMS  NO NEW EXAMS   LABS REVIEWED: PREVIOUS    03-01-16: wbc 5.3; hgb 13.1; hct 40.4; mcv 88.8; plt 228; glucose 211; bun 29; creat 1.84; k+ 4.3; na++ 138; tsh 30.991  04-15-16: wbc 4.9; hgb 12.7; hct 40.1; mcv 92.3; plt 200; glucose 169; bun 23.6; creat 1.21; k+ 4.4; na++ 141; liver normal albumin 3.8; hgb a1c 8.2 04-16-16: glucose 171; bun 19.3; creat 1.35; k+ 4.8; na++ 140; liver normal albumin 3.6; chol 190; ldl 112; trig 109; hdl 56 tsh 15.82; free T4: 0.63; hgb a1c 8.0  05-15-16: tsh 71.63 free T 4: 0.33 06-18-16: tsh 4.63; free T 4: 1.20 free T 3: 1.7 08-20-16: glucose 116; bun 41.9; creat 1.50; k+ 4.4; na++ 145; ca 9.1 tsh 2.06 ast 11; hgb a1c 7.3; chol 142; ldl 63; trig 83; hdl 62; urine micro-albumin 1.6   TODAY:   10-21-16: wbc 6.1; hgb 10.1; hct 31.8; mcv 89.3; plt 296; glucose 245; bun 48; creat 1.5; k+ 5.0 na++ 150; ca 9.4 10-22-16; wbc 5.8; hgb 9.7; hct 30.8; mcv 89.8; plt 280; glucose 205; bun 36; creat 1.32; k+ 4.6; na++ 149; liver normal albumin 3.1   Review of Systems  Unable to perform ROS: Dementia (unable to answer questions )    Physical Exam  Constitutional: No distress.  Eyes: Conjunctivae are normal.  Neck: Neck supple. No JVD present. No thyromegaly present.  Cardiovascular: Normal rate, regular rhythm and intact distal pulses.   Murmur heard. 1/6  Respiratory: Effort normal and breath sounds normal. No respiratory distress. She has no wheezes.  GI: Soft. Bowel sounds are normal. She exhibits no distension. There is no tenderness.  Musculoskeletal: She exhibits no edema.  Able to move all extremities   Lymphadenopathy:    She has no cervical adenopathy.  Neurological: She is alert.  Skin: Skin is warm and dry. She is not diaphoretic.  Left axilla: dressing intact status post I/D and evacuation  Psychiatric: She has a  normal mood and affect.    ASSESSMENT/ PLAN:  TODAY:   1. Diabetes  Is stable hgb a1c 7.3(  previous  8.0) ; will continue tradjenta 5 mg daily; levemir 5 units nightly  novolog  3 units for cbg >200   is on ace and statin   2. Hypothyroidism: is stable  tsh is 2.06  Will continue synthroid 137 mcg at this time.    3. Dyslipidemia; is stable  ldl 63; will continue lipitor 20 mg daily   4. Hypertension:  Stable b/p  147/59  will continue norvasc 10 mg daily    her lisinopril and hctz were stopped in the hospital.   5. Cardiac arrhythmia: stable  has a history of left arm dvt: heart rate is stable; will continue xarelto 20 mg daily  Has had a left axilla hematoma.   6. Gerd: stable  will continue prilosec 20 mg daily   7. Dementia: no change:  her current weight is 160 pounds; will continue namzaric 28-10 mg daily  8. Depression with anxiety: stable  will continue zoloft 100 mg daily and is taking ativan 0.5 mg in the AM and 1 mg in the PM for anxiety.   9. Psychosis no change in status : is on seroquel 25 mg in the AM and 50 mg in the PM.   10. Stage IV CKD: has had acute on chronic renal failure: bun 36; creat 1.32   11. Left axilla hematoma: status post I/D and evacuation: will continue cleocin 600 mg tid for 4 days from 10-23-16. Dressing intact no signs of infection present.   Will repeat cbc; cmp in the AM   MD is aware of resident's narcotic use and is in agreement with current plan of care. We will attempt to wean resident as apropriate     Synthia Innocent NP [facility]  Contact [phone] Monday through Friday 8am- 5pm  After hours call [phone]

## 2016-10-23 NOTE — Clinical Social Work Note (Signed)
Clinical Social Work Assessment  Patient Details  Name: Cynthia [name] MRN: [id number] Date of Birth: 09/08/1927  Date of referral:  10/23/16               Reason for consult:   (Pt admitted from facility)                Permission sought to share information with:  Family Supports, Oceanographeracility Contact Representative Permission granted to share information::  Yes, Verbal Permission Granted  Name::     daughter Psychologist, prison and probation servicesMargie  Agency::  [facility]  Relationship::     Contact Information:     Housing/Transportation Living arrangements for the past 2 months:  Skilled Building surveyorursing Facility Source of Information:  Adult Children, Facility, Patient Patient Interpreter Needed:  None Criminal Activity/Legal Involvement Pertinent to Current Situation/Hospitalization:  No - Comment as needed Significant Relationships:  Adult Children, Merchandiser, retailCommunity Support Lives with:  Facility Resident Do you feel safe going back to the place where you live?  Yes Need for family participation in patient care:  Yes (Comment) (pt with dementia- daughter primary decision maker)   Care giving concerns:  Pt admitted from SNF- [facility], where she is a long-term care resident. Has lived there since 04/2016 [facility]  Daughter reports pt's unable to walk or complete ADLs independently at baseline and facility staff and daughter assist her with all care (inluding eating).   Social Worker assessment / plan:  CSW consulted as pt is from a facility- [facility]. Plans to return at DC today- confirmed with daughter and facility who is prepared for pt's return. All information provided to facility via the [facility]. Pt immobile and will need PTAR transport per daughter- CSW completed medical necessity form and will arrange transportation.  Facility states no updated FL2 needed. Out of facility DNR included in pt's DC packet.   RN report #: [phone]  Plan: Return to [facility] at DC  today  Employment status:  Retired Health and safety inspectornsurance information:  Medicare PT Recommendations:  Not assessed at this time Information / Referral to community resources:  Skilled Nursing Facility  Patient/Family's Response to care: Pt responds pleasantly but is not oriented to understand care received. Daughter expresses appreciation for care.   Patient/Family's Understanding of and Emotional Response to Diagnosis, Current Treatment, and Prognosis:  See above- pt not oriented to situation (dementia) but does express understanding she is returning to facility today. Daughter expresses adequate understanding and asks pertinent questions re: pt's care  Emotional Assessment Appearance:  Appears stated age Attitude/Demeanor/Rapport:   (appropriate) Affect (typically observed):  Calm Orientation:  Oriented to Self, Oriented to Place, Oriented to Situation Alcohol / Substance use:  Not Applicable Psych involvement (Current and /or in the community):  No (Comment)  Discharge Needs  Concerns to be addressed:  No discharge needs identified Readmission within the last 30 days:  No Current discharge risk:  None Barriers to Discharge:  No Barriers Identified   Nelwyn SalisburyMeghan R Gabrella Stroh, LCSW 10/23/2016, 10:27 AM  [phone]

## 2016-10-23 NOTE — Progress Notes (Signed)
Patient ID: Cynthia [name], female   DOB: 02/23/1928, 83 y.o.   MRN: [id number] 1 Day Post-Op   Subjective: No pain or other C/O  Objective: Vital signs in last 24 hours: Temp:  [97.6 F (36.4 C)-98.6 F (37 C)] 97.6 F (36.4 C) (08/15 0440) Pulse Rate:  [67-76] 67 (08/15 0440) Resp:  [14-16] 16 (08/15 0440) BP: (101-133)/(49-62) 133/62 (08/15 0440) SpO2:  [92 %-100 %] 92 % (08/15 0440) Last BM Date: 10/21/16  Intake/Output from previous day: 08/14 0701 - 08/15 0700 In: 1075 [I.V.:925; IV Piggyback:150] Out: 445 [Urine:425; Blood:20] Intake/Output this shift: Total I/O In: 360 [P.O.:360] Out: -   Incision/Wound: Wound with minimal drainage.  Less swelling and induration  Lab Results:   Recent Labs  10/22/16 0437 10/23/16 0422  WBC 5.8 5.4  HGB 9.7* 8.6*  HCT 30.8* 27.3*  PLT 280 261   BMET  Recent Labs  10/22/16 0437 10/23/16 0422  NA 149* 144  K 4.6 4.8  CL 117* 113*  CO2 25 24  GLUCOSE 204* 223*  BUN 36* 45*  CREATININE 1.32* 1.93*  CALCIUM 9.0 8.3*     Studies/Results: No results found.  Anti-infectives: Anti-infectives    Start     Dose/Rate Route Frequency Ordered Stop   10/23/16 0000  clindamycin (CLEOCIN) 300 MG capsule     600 mg Oral 3 times daily 10/23/16 1022 11/02/16 2359   10/21/16 2200  clindamycin (CLEOCIN) IVPB 600 mg     600 mg 100 mL/hr over 30 Minutes Intravenous Every 8 hours 10/21/16 2050        Assessment/Plan: s/p Procedure(s): INCISION AND DRAINAGE LEFT AXILLARY  Hematoma OK for discharge from surgical perspective Wound care orders and F/U written She may need breast imaging if induration does not resolve.  Not sure why she got this hematoma.  I will F/U in office.  Discussed with pts daughter    LOS: 1 day    Treesa Mccully T 10/23/2016

## 2016-10-24 ENCOUNTER — Non-Acute Institutional Stay (SKILLED_NURSING_FACILITY): Payer: Medicare Other | Admitting: Internal Medicine

## 2016-10-24 ENCOUNTER — Encounter: Payer: Self-pay | Admitting: Internal Medicine

## 2016-10-24 ENCOUNTER — Telehealth: Payer: Self-pay

## 2016-10-24 DIAGNOSIS — E46 Unspecified protein-calorie malnutrition: Secondary | ICD-10-CM | POA: Diagnosis not present

## 2016-10-24 DIAGNOSIS — F0151 Vascular dementia with behavioral disturbance: Secondary | ICD-10-CM

## 2016-10-24 DIAGNOSIS — D638 Anemia in other chronic diseases classified elsewhere: Secondary | ICD-10-CM | POA: Diagnosis not present

## 2016-10-24 DIAGNOSIS — N184 Chronic kidney disease, stage 4 (severe): Secondary | ICD-10-CM | POA: Diagnosis not present

## 2016-10-24 DIAGNOSIS — Z794 Long term (current) use of insulin: Secondary | ICD-10-CM | POA: Diagnosis not present

## 2016-10-24 DIAGNOSIS — E8809 Other disorders of plasma-protein metabolism, not elsewhere classified: Secondary | ICD-10-CM

## 2016-10-24 DIAGNOSIS — E1122 Type 2 diabetes mellitus with diabetic chronic kidney disease: Secondary | ICD-10-CM

## 2016-10-24 DIAGNOSIS — I10 Essential (primary) hypertension: Secondary | ICD-10-CM | POA: Diagnosis not present

## 2016-10-24 DIAGNOSIS — S40022S Contusion of left upper arm, sequela: Secondary | ICD-10-CM

## 2016-10-24 DIAGNOSIS — I152 Hypertension secondary to endocrine disorders: Secondary | ICD-10-CM

## 2016-10-24 DIAGNOSIS — F01518 Vascular dementia, unspecified severity, with other behavioral disturbance: Secondary | ICD-10-CM

## 2016-10-24 DIAGNOSIS — E1159 Type 2 diabetes mellitus with other circulatory complications: Secondary | ICD-10-CM | POA: Diagnosis not present

## 2016-10-24 DIAGNOSIS — I499 Cardiac arrhythmia, unspecified: Secondary | ICD-10-CM

## 2016-10-24 DIAGNOSIS — E034 Atrophy of thyroid (acquired): Secondary | ICD-10-CM | POA: Diagnosis not present

## 2016-10-24 LAB — BASIC METABOLIC PANEL
BUN: 40 — AB (ref 4–21)
Creatinine: 1.6 — AB (ref 0.5–1.1)
GLUCOSE: 172
POTASSIUM: 4.9 (ref 3.4–5.3)
SODIUM: 142 (ref 137–147)

## 2016-10-24 LAB — HEPATIC FUNCTION PANEL
ALT: 11 (ref 7–35)
AST: 12 — AB (ref 13–35)
Alkaline Phosphatase: 101 (ref 25–125)
BILIRUBIN, TOTAL: 0.2

## 2016-10-24 LAB — CBC AND DIFFERENTIAL
HCT: 30 — AB (ref 36–46)
Hemoglobin: 9.5 — AB (ref 12.0–16.0)
NEUTROS ABS: 5
Platelets: 287 (ref 150–399)
WBC: 6.3

## 2016-10-24 NOTE — Progress Notes (Signed)
Patient ID: Cynthia [name], female   DOB: 1927-06-17, 83 y.o.   MRN: [id number]     HISTORY AND PHYSICAL   DATE:   October 24, 2016  Location:   [place] Room Number: [place] of Service: SNF (31)   Extended Emergency Contact Information Primary Emergency Contact: Elmore,[name] Address: [place] Phone: [phone] Relation: Daughter  Advanced Directive information Does Patient Have a Medical Advance Directive?: Yes, Type of Advance Directive: Out of facility DNR (pink MOST or yellow form), Pre-existing out of facility DNR order (yellow form or pink MOST form): Pink MOST form placed in chart (order not valid for inpatient use), Does patient want to make changes to medical advance directive?: No - Patient declined  Chief Complaint  Patient presents with  . Readmit To SNF    Readmission    HPI:  83 yo female long term resident seen today for readmission into [facility] following hospital stay for abscess, left axillary hematoma, hypernatremia, dementia, hypothyroidism, DM, hyperlipidemia, HTN. She was taken to the OR 10/22/16 for I&D left axillary and s/p evacuation. No abscess found but instead an organized hematoma. Wound cx pending at d/c. She was tx with empiric abx. meds adjusted 2/2 AKI. She is not renal replacement tx (HD) candidate 2/2 advanced dementia and frailty. Gentle hydration given for dehydration. Cr 1.5-->1.93; albumin 3.1; Na 150-->144; Hgb 10.1-->8.6 at d/c. She presents to SNF to continue long term care.  Today she reports no concerns. No left arm pain. No f/c. No nursing issues. Appetite poor. Sleeps well. She is a poor historian due to dementia. Hx obtained from chart. Hospital wound cx showed no growth. Surgical path revealed organized fibrin blood clot. She is currently on cleocin 664m TID (day 2 of 4)  DM - controlled. A1c 7.3%. CBGs 140-210s. No low BS reactions. she takes tradjenta 5 mg daily; levemir  5 units nightly; novolog 3 units for cbg >200  Hypothyroidism - controlled. TSH 2.06. Takes synthroid daily   Dyslipidemia - stable on lipitor 20 mg daily. LDL 63  Hypertension - BP stable on norvasc 10 mg daily. Lisinopril and HCTX stopped prior to hospital d/c  Cardiac arrhythmia - rate controlled without medication. She takes xarelto 20 mg daily.   Hx left arm DVT - stable on xeralto. She is s/p evacuation of left axillary hematoma  GERD - stable on prilosec 20 mg daily   Dementia - unchanged. End stage. Weight is down 8 lbs since July 2018. Takes namzaric 28-10 mg daily  Depression with anxiety - mood stable on zoloft 100 mg daily; ativan 0.5 mg in the AM and 1 mg in the PM for anxiety.   Psychosis - stable on seroquel 25 mg in the AM and 50 mg in the PM.   CKD - stage 4. Cr 1.93    Past Medical History:  Diagnosis Date  . CKD (chronic kidney disease)   . Dementia   . Diabetes mellitus without complication ([place]   . Hypertension   . Hypothyroid   . Hypothyroid   . Thyroid disease     Past Surgical History:  Procedure Laterality Date  . ABDOMINAL HYSTERECTOMY    . FRACTURE SURGERY    . INCISION AND DRAINAGE ABSCESS Left 10/22/2016   Procedure: INCISION AND DRAINAGE LEFT AXILLARY  ABSCESS;  Surgeon: HExcell Seltzer MD;  Location: [facility];  Service: General;  Laterality: Left;    Patient Care Team: CGildardo Cranker DO  as PCP - General (Internal Medicine)  Social History   Social History  . Marital status: Widowed    Spouse name: N/A  . Number of children: N/A  . Years of education: N/A   Occupational History  . Not on file.   Social History Main Topics  . Smoking status: Never Smoker  . Smokeless tobacco: Never Used  . Alcohol use No  . Drug use: No  . Sexual activity: Not Currently   Other Topics Concern  . Not on file   Social History Narrative  . No narrative on file     reports that she has never smoked. She has never used smokeless tobacco.  She reports that she does not drink alcohol or use drugs.  History reviewed. No pertinent family history. No family status information on file.    Immunization History  Administered Date(s) Administered  . PPD Test 07/13/2016    No Known Allergies  Medications: Patient's Medications  New Prescriptions   No medications on file  Previous Medications   ACETAMINOPHEN 500 MG COAPSULE    Take 500 mg by mouth 3 (three) times daily.   ACETAMINOPHEN 500 MG COAPSULE    Take 500 mg by mouth every 6 (six) hours as needed for fever (headache and minor discomfort).   AMLODIPINE (NORVASC) 10 MG TABLET    Take 10 mg by mouth daily.   CLINDAMYCIN (CLEOCIN) 300 MG CAPSULE    Take 2 capsules (600 mg total) by mouth 3 (three) times daily. For 4 more days from 8/15   INSULIN ASPART (NOVOLOG) 100 UNIT/ML INJECTION    Inject 3 Units into the skin 3 (three) times daily before meals. Give before meals for blood sugar > 200.  Notify MD is blood sugar is less than 70 or greater than 450.  Hold Insulin if less than 70   INSULIN DETEMIR (LEVEMIR) 100 UNIT/ML INJECTION    Inject 5 Units into the skin at bedtime.   LEVOTHYROXINE (SYNTHROID, LEVOTHROID) 137 MCG TABLET    Take 137 mcg by mouth daily.    LINAGLIPTIN (TRADJENTA) 5 MG TABS TABLET    Take 1 tablet (5 mg total) by mouth daily.   LOPERAMIDE (IMODIUM) 2 MG CAPSULE    Take 2 mg by mouth every 8 (eight) hours as needed for diarrhea or loose stools.    LORAZEPAM (ATIVAN) 0.5 MG TABLET    Take 2 tablets (1 mg total) by mouth at bedtime. Take two tablets by mouth every evening for anxiety   LORAZEPAM (ATIVAN) 0.5 MG TABLET    Take 1 tablet (0.5 mg total) by mouth daily.   MAGNESIUM HYDROXIDE (MILK OF MAGNESIA) 400 MG/5ML SUSPENSION    Take 30 mLs by mouth at bedtime as needed for mild constipation.   NAMZARIC 28-10 MG CP24    Take 1 capsule by mouth daily.   OMEPRAZOLE (PRILOSEC) 20 MG CAPSULE    Take 20 mg by mouth daily.   ONDANSETRON (ZOFRAN-ODT) 8 MG  DISINTEGRATING TABLET    Take 8 mg by mouth every 8 (eight) hours as needed for nausea or vomiting.   QUETIAPINE (SEROQUEL) 50 MG TABLET    Take 25-50 mg by mouth 2 (two) times daily. Take 25 mg in the morning and 50 mg at bedtime   SERTRALINE (ZOLOFT) 100 MG TABLET    Take 100 mg by mouth daily.   VITAMIN B-12 (CYANOCOBALAMIN) 1000 MCG TABLET    Take 1,000 mcg by mouth daily.   XARELTO 20 MG  TABS TABLET    Take 20 mg by mouth daily.  Modified Medications   No medications on file  Discontinued Medications   No medications on file    Review of Systems  Unable to perform ROS: Dementia    Vitals:   10/24/16 1056  BP: (!) 147/59  Pulse: 72  Resp: 18  Temp: 97.7 F (36.5 C)  SpO2: 95%  Weight: 160 lb (72.6 kg)  Height: 5' 3"  (1.6 m)   Body mass index is 28.34 kg/m.  Physical Exam  Constitutional: She appears well-developed.  Frail appearing in NAD, lying in bed  HENT:  Mouth/Throat: Oropharynx is clear and moist. No oropharyngeal exudate.  Poor dentition, MMdry, no oral thrush  Eyes: Pupils are equal, round, and reactive to light. No scleral icterus.  Neck: Neck supple. Carotid bruit is not present. No tracheal deviation present. No thyromegaly present.  Cardiovascular: Normal rate, regular rhythm and intact distal pulses.  Exam reveals no gallop and no friction rub.   Murmur (1/6 SEM) heard. LUE edema improved. No LE edema b/l. No calf TTP  Pulmonary/Chest: Effort normal and breath sounds normal. No stridor. No respiratory distress. She has no wheezes. She has no rales.  Abdominal: Soft. Normal appearance and bowel sounds are normal. She exhibits no distension and no mass. There is no hepatomegaly. There is no tenderness. There is no rigidity, no rebound and no guarding. No hernia.  Musculoskeletal: She exhibits edema.  Lymphadenopathy:    She has no cervical adenopathy.  Neurological: She is alert.  Skin: Skin is warm and dry. No rash noted.  Left axillary dsg c/d/i    Psychiatric: She has a normal mood and affect. Her behavior is normal. Thought content is delusional.     Labs reviewed: Admission on 10/21/2016, Discharged on 10/23/2016  Component Date Value Ref Range Status  . WBC 10/21/2016 6.1  4.0 - 10.5 K/uL Final  . RBC 10/21/2016 3.56* 3.87 - 5.11 MIL/uL Final  . Hemoglobin 10/21/2016 10.1* 12.0 - 15.0 g/dL Final  . HCT 10/21/2016 31.8* 36.0 - 46.0 % Final  . MCV 10/21/2016 89.3  78.0 - 100.0 fL Final  . MCH 10/21/2016 28.4  26.0 - 34.0 pg Final  . MCHC 10/21/2016 31.8  30.0 - 36.0 g/dL Final  . RDW 10/21/2016 14.6  11.5 - 15.5 % Final  . Platelets 10/21/2016 296  150 - 400 K/uL Final  . Neutrophils Relative % 10/21/2016 70  % Final  . Neutro Abs 10/21/2016 4.2  1.7 - 7.7 K/uL Final  . Lymphocytes Relative 10/21/2016 23  % Final  . Lymphs Abs 10/21/2016 1.4  0.7 - 4.0 K/uL Final  . Monocytes Relative 10/21/2016 5  % Final  . Monocytes Absolute 10/21/2016 0.3  0.1 - 1.0 K/uL Final  . Eosinophils Relative 10/21/2016 2  % Final  . Eosinophils Absolute 10/21/2016 0.1  0.0 - 0.7 K/uL Final  . Basophils Relative 10/21/2016 0  % Final  . Basophils Absolute 10/21/2016 0.0  0.0 - 0.1 K/uL Final  . Sodium 10/21/2016 150* 135 - 145 mmol/L Final  . Potassium 10/21/2016 5.0  3.5 - 5.1 mmol/L Final  . Chloride 10/21/2016 120* 101 - 111 mmol/L Final  . CO2 10/21/2016 24  22 - 32 mmol/L Final  . Glucose, Bld 10/21/2016 245* 65 - 99 mg/dL Final  . BUN 10/21/2016 48* 6 - 20 mg/dL Final  . Creatinine, Ser 10/21/2016 1.50* 0.44 - 1.00 mg/dL Final  . Calcium 10/21/2016 9.4  8.9 -  10.3 mg/dL Final  . GFR calc non Af Amer 10/21/2016 30* >60 mL/min Final  . GFR calc Af Amer 10/21/2016 35* >60 mL/min Final   Comment: (NOTE) The eGFR has been calculated using the CKD EPI equation. This calculation has not been validated in all clinical situations. eGFR's persistently <60 mL/min signify possible Chronic Kidney Disease.   . Anion gap 10/21/2016 6  5 - 15  Final  . Lactic Acid, Venous 10/21/2016 1.43  0.5 - 1.9 mmol/L Final  . Sodium 10/22/2016 149* 135 - 145 mmol/L Final  . Potassium 10/22/2016 4.6  3.5 - 5.1 mmol/L Final  . Chloride 10/22/2016 117* 101 - 111 mmol/L Final  . CO2 10/22/2016 25  22 - 32 mmol/L Final  . Glucose, Bld 10/22/2016 204* 65 - 99 mg/dL Final  . BUN 10/22/2016 36* 6 - 20 mg/dL Final  . Creatinine, Ser 10/22/2016 1.32* 0.44 - 1.00 mg/dL Final  . Calcium 10/22/2016 9.0  8.9 - 10.3 mg/dL Final  . Total Protein 10/22/2016 6.8  6.5 - 8.1 g/dL Final  . Albumin 10/22/2016 3.1* 3.5 - 5.0 g/dL Final  . AST 10/22/2016 14* 15 - 41 U/L Final  . ALT 10/22/2016 16  14 - 54 U/L Final  . Alkaline Phosphatase 10/22/2016 88  38 - 126 U/L Final  . Total Bilirubin 10/22/2016 0.5  0.3 - 1.2 mg/dL Final  . GFR calc non Af Amer 10/22/2016 35* >60 mL/min Final  . GFR calc Af Amer 10/22/2016 41* >60 mL/min Final   Comment: (NOTE) The eGFR has been calculated using the CKD EPI equation. This calculation has not been validated in all clinical situations. eGFR's persistently <60 mL/min signify possible Chronic Kidney Disease.   . Anion gap 10/22/2016 7  5 - 15 Final  . WBC 10/22/2016 5.8  4.0 - 10.5 K/uL Final  . RBC 10/22/2016 3.43* 3.87 - 5.11 MIL/uL Final  . Hemoglobin 10/22/2016 9.7* 12.0 - 15.0 g/dL Final  . HCT 10/22/2016 30.8* 36.0 - 46.0 % Final  . MCV 10/22/2016 89.8  78.0 - 100.0 fL Final  . MCH 10/22/2016 28.3  26.0 - 34.0 pg Final  . MCHC 10/22/2016 31.5  30.0 - 36.0 g/dL Final  . RDW 10/22/2016 14.8  11.5 - 15.5 % Final  . Platelets 10/22/2016 280  150 - 400 K/uL Final  . MRSA by PCR 10/21/2016 NEGATIVE  NEGATIVE Final   Comment:        The GeneXpert MRSA Assay (FDA approved for NASAL specimens only), is one component of a comprehensive MRSA colonization surveillance program. It is not intended to diagnose MRSA infection nor to guide or monitor treatment for MRSA infections.   . Glucose-Capillary 10/21/2016 131*  65 - 99 mg/dL Final  . Comment 1 10/21/2016 Notify RN   Final  . Glucose-Capillary 10/22/2016 155* 65 - 99 mg/dL Final  . Specimen Description 10/22/2016 ABSCESS   Final  . Special Requests 10/22/2016 NONE   Final  . Gram Stain 10/22/2016    Final                   Value:NO WBC SEEN NO ORGANISMS SEEN   . Culture 10/22/2016    Final                   Value:NO GROWTH 1 DAY Performed at [facility], [place] [address], [place], [place] [address]   . Report Status 10/22/2016 PENDING   Incomplete  . Glucose-Capillary 10/22/2016 146*  65 - 99 mg/dL Final  . Comment 1 10/22/2016 Notify RN   Final  . Comment 2 10/22/2016 Document in Chart   Final  . Glucose-Capillary 10/22/2016 166* 65 - 99 mg/dL Final  . Sodium 10/23/2016 144  135 - 145 mmol/L Final  . Potassium 10/23/2016 4.8  3.5 - 5.1 mmol/L Final  . Chloride 10/23/2016 113* 101 - 111 mmol/L Final  . CO2 10/23/2016 24  22 - 32 mmol/L Final  . Glucose, Bld 10/23/2016 223* 65 - 99 mg/dL Final  . BUN 10/23/2016 45* 6 - 20 mg/dL Final  . Creatinine, Ser 10/23/2016 1.93* 0.44 - 1.00 mg/dL Final  . Calcium 10/23/2016 8.3* 8.9 - 10.3 mg/dL Final  . GFR calc non Af Amer 10/23/2016 22* >60 mL/min Final  . GFR calc Af Amer 10/23/2016 26* >60 mL/min Final   Comment: (NOTE) The eGFR has been calculated using the CKD EPI equation. This calculation has not been validated in all clinical situations. eGFR's persistently <60 mL/min signify possible Chronic Kidney Disease.   . Anion gap 10/23/2016 7  5 - 15 Final  . WBC 10/23/2016 5.4  4.0 - 10.5 K/uL Final  . RBC 10/23/2016 2.95* 3.87 - 5.11 MIL/uL Final  . Hemoglobin 10/23/2016 8.6* 12.0 - 15.0 g/dL Final  . HCT 10/23/2016 27.3* 36.0 - 46.0 % Final  . MCV 10/23/2016 92.5  78.0 - 100.0 fL Final  . MCH 10/23/2016 29.2  26.0 - 34.0 pg Final  . MCHC 10/23/2016 31.5  30.0 - 36.0 g/dL Final  . RDW 10/23/2016 15.2  11.5 - 15.5 % Final  . Platelets 10/23/2016 261  150 - 400 K/uL Final  .  Glucose-Capillary 10/22/2016 218* 65 - 99 mg/dL Final  . Glucose-Capillary 10/22/2016 220* 65 - 99 mg/dL Final  . Glucose-Capillary 10/23/2016 157* 65 - 99 mg/dL Final  Abstract on 08/21/2016  Component Date Value Ref Range Status  . Microalb, Ur 08/20/2016 1.6   Final  . Glucose 08/20/2016 116   Final  . BUN 08/20/2016 42* 4 - 21 Final  . Creatinine 08/20/2016 1.5* 0.5 - 1.1 Final  . Potassium 08/20/2016 4.4  3.4 - 5.3 Final  . Sodium 08/20/2016 145  137 - 147 Final  . Triglycerides 08/20/2016 83  40 - 160 Final  . Cholesterol 08/20/2016 142  0 - 200 Final  . HDL 08/20/2016 62  35 - 70 Final  . LDL Cholesterol 08/20/2016 63   Final  . ALT 08/20/2016 11  7 - 35 Final  . Hemoglobin A1C 08/20/2016 7.3   Final  . TSH 08/20/2016 2.06  0.41 - 5.90 Final  Nursing Home on 08/19/2016  Component Date Value Ref Range Status  . TSH 06/18/2016 4.63  0.41 - 5.90 Final    No results found.   Assessment/Plan   ICD-10-CM   1. Axillary hematoma, left, sequela S40.022S   2. Vascular dementia with behavior disturbance F01.51   3. Type 2 diabetes mellitus with stage 4 chronic kidney disease, with long-term current use of insulin (HCC) E11.22    N18.4    Z79.4   4. Hypertension associated with diabetes ([place]) E11.59    I10   5. Cardiac arrhythmia, unspecified cardiac arrhythmia type I49.9   6. Hypoalbuminemia due to protein-calorie malnutrition ([place]) E46   7. Hypothyroidism due to acquired atrophy of thyroid E03.4   8. Anemia of chronic disease D63.8     CBC and cmp pending  Cont current meds as ordered  PT/OT/ST as indicated  Wound care as ordered  Cont nutritional supplements as ordered  GOAL: long term care. Communicated with pt and nursing.  Will follow  Norfleet Capers S. Perlie Gold  [facility] [address] [place], [place] [address] [phone] Cell (Monday-Friday 8 AM - 5 PM) [phone] After 5 PM and follow prompts

## 2016-10-24 NOTE — Telephone Encounter (Signed)
Possible re-admission to facility. This is [facility] is needed if patient was re-admitted to facility upon discharge. [facility]

## 2016-10-27 LAB — AEROBIC/ANAEROBIC CULTURE (SURGICAL/DEEP WOUND): CULTURE: NO GROWTH

## 2016-10-27 LAB — AEROBIC/ANAEROBIC CULTURE W GRAM STAIN (SURGICAL/DEEP WOUND): Gram Stain: NONE SEEN

## 2016-11-04 ENCOUNTER — Other Ambulatory Visit: Payer: Self-pay

## 2016-11-04 MED ORDER — LORAZEPAM 0.5 MG PO TABS: 0.5000 mg | ORAL_TABLET | Freq: Every day | ORAL | 0 refills | Status: DC

## 2016-11-04 NOTE — Telephone Encounter (Signed)
RX faxed to [facility] @ [phone], phone number [phone] 

## 2016-11-15 ENCOUNTER — Other Ambulatory Visit: Payer: Self-pay

## 2016-11-15 ENCOUNTER — Non-Acute Institutional Stay (SKILLED_NURSING_FACILITY): Payer: Medicare Other | Admitting: Adult Health

## 2016-11-15 ENCOUNTER — Encounter: Payer: Self-pay | Admitting: Adult Health

## 2016-11-15 DIAGNOSIS — N184 Chronic kidney disease, stage 4 (severe): Secondary | ICD-10-CM | POA: Diagnosis not present

## 2016-11-15 DIAGNOSIS — E034 Atrophy of thyroid (acquired): Secondary | ICD-10-CM | POA: Diagnosis not present

## 2016-11-15 DIAGNOSIS — Z794 Long term (current) use of insulin: Secondary | ICD-10-CM | POA: Diagnosis not present

## 2016-11-15 DIAGNOSIS — E1169 Type 2 diabetes mellitus with other specified complication: Secondary | ICD-10-CM

## 2016-11-15 DIAGNOSIS — I10 Essential (primary) hypertension: Secondary | ICD-10-CM | POA: Diagnosis not present

## 2016-11-15 DIAGNOSIS — E1159 Type 2 diabetes mellitus with other circulatory complications: Secondary | ICD-10-CM

## 2016-11-15 DIAGNOSIS — E785 Hyperlipidemia, unspecified: Secondary | ICD-10-CM

## 2016-11-15 DIAGNOSIS — E1122 Type 2 diabetes mellitus with diabetic chronic kidney disease: Secondary | ICD-10-CM

## 2016-11-15 MED ORDER — LORAZEPAM 0.5 MG PO TABS: 0.5000 mg | ORAL_TABLET | Freq: Every day | ORAL | 0 refills | Status: DC

## 2016-11-15 MED ORDER — LORAZEPAM 1 MG PO TABS: 1.0000 mg | ORAL_TABLET | Freq: Every day | ORAL | 0 refills | Status: DC

## 2016-11-15 NOTE — Progress Notes (Signed)
Location:   [facility] Room Number: 202 B Place of Service:  SNF (31)   CODE STATUS: DNR  No Known Allergies  Chief Complaint  Patient presents with  . Medical Management of Chronic Issues    diabetes; hypothyroidism; dyslipidemia; hypertension     HPI:  She is a 83 year old long term resident of this facility being seen for the management of her chronic illnesses: diabetes; hypothyroidism; dyslipidemia; and hypertension.  She is unable to participate in the hpi or ros. There are reports of low or high cbgs; no indications of pain present; no changes in her appetite. There are no nursing concerns at this time.   Past Medical History:  Diagnosis Date  . CKD (chronic kidney disease)   . Dementia   . Diabetes mellitus without complication (HCC)   . Hypertension   . Hypothyroid   . Hypothyroid   . Thyroid disease     Past Surgical History:  Procedure Laterality Date  . ABDOMINAL HYSTERECTOMY    . FRACTURE SURGERY    . INCISION AND DRAINAGE ABSCESS Left 10/22/2016   Procedure: INCISION AND DRAINAGE LEFT AXILLARY  ABSCESS;  Surgeon: Glenna Fellows, MD;  Location: [facility];  Service: General;  Laterality: Left;    Social History   Social History  . Marital status: Widowed    Spouse name: N/A  . Number of children: N/A  . Years of education: N/A   Occupational History  . Not on file.   Social History Main Topics  . Smoking status: Never Smoker  . Smokeless tobacco: Never Used  . Alcohol use No  . Drug use: No  . Sexual activity: Not Currently   Other Topics Concern  . Not on file   Social History Narrative  . No narrative on file   History reviewed. No pertinent family history.    VITAL SIGNS Ht  (1.6 m)   Wt 160 lb (72.6 kg)   BMI 28.34 kg/m   Patient's Medications  New Prescriptions   No medications on file  Previous Medications   ACETAMINOPHEN 500 MG COAPSULE    Take 500 mg by mouth 3 (three) times daily.   ACETAMINOPHEN 500  MG COAPSULE    Take 500 mg by mouth every 6 (six) hours as needed for fever (headache and minor discomfort).   AMLODIPINE (NORVASC) 10 MG TABLET    Take 10 mg by mouth daily.   INSULIN ASPART (NOVOLOG) 100 UNIT/ML INJECTION    Inject 3 Units into the skin 3 (three) times daily before meals. Give before meals for blood sugar > 200.  Notify MD is blood sugar is less than 70 or greater than 450.  Hold Insulin if less than 70   INSULIN DETEMIR (LEVEMIR) 100 UNIT/ML INJECTION    Inject 5 Units into the skin at bedtime.   LEVOTHYROXINE (SYNTHROID, LEVOTHROID) 137 MCG TABLET    Take 137 mcg by mouth daily.    LOPERAMIDE (IMODIUM) 2 MG CAPSULE    Take 2 mg by mouth every 8 (eight) hours as needed for diarrhea or loose stools.    MAGNESIUM HYDROXIDE (MILK OF MAGNESIA) 400 MG/5ML SUSPENSION    Take 30 mLs by mouth at bedtime as needed for mild constipation.   NAMZARIC 28-10 MG CP24    Take 1 capsule by mouth daily.   OMEPRAZOLE (PRILOSEC) 20 MG CAPSULE    Take 20 mg by mouth daily.   ONDANSETRON (ZOFRAN-ODT) 8 MG DISINTEGRATING TABLET  Take 8 mg by mouth every 8 (eight) hours as needed for nausea or vomiting.   QUETIAPINE (SEROQUEL) 50 MG TABLET    Take 25-50 mg by mouth 2 (two) times daily. Take 25 mg in the morning and 50 mg at bedtime   SERTRALINE (ZOLOFT) 100 MG TABLET    Take 100 mg by mouth daily.   VITAMIN B-12 (CYANOCOBALAMIN) 1000 MCG TABLET    Take 1,000 mcg by mouth daily.   XARELTO 20 MG TABS TABLET    Take 20 mg by mouth daily.  Modified Medications   Modified Medication Previous Medication   LORAZEPAM (ATIVAN) 0.5 MG TABLET LORazepam (ATIVAN) 0.5 MG tablet      Take 1 tablet (0.5 mg total) by mouth daily.    Take 1 tablet (0.5 mg total) by mouth daily.   LORAZEPAM (ATIVAN) 1 MG TABLET LORazepam (ATIVAN) 1 MG tablet      Take 1 tablet (1 mg total) by mouth at bedtime.    Take 1 mg by mouth at bedtime.  Discontinued Medications   LINAGLIPTIN (TRADJENTA) 5 MG TABS TABLET    Take 1 tablet (5  mg total) by mouth daily.     SIGNIFICANT DIAGNOSTIC EXAMS  NO NEW EXAMS   LABS REVIEWED: PREVIOUS    03-01-16: wbc 5.3; hgb 13.1; hct 40.4; mcv 88.8; plt 228; glucose 211; bun 29; creat 1.84; k+ 4.3; na++ 138; tsh 30.991  04-15-16: wbc 4.9; hgb 12.7; hct 40.1; mcv 92.3; plt 200; glucose 169; bun 23.6; creat 1.21; k+ 4.4; na++ 141; liver normal albumin 3.8; hgb a1c 8.2 04-16-16: glucose 171; bun 19.3; creat 1.35; k+ 4.8; na++ 140; liver normal albumin 3.6; chol 190; ldl 112; trig 109; hdl 56 tsh 15.82; free T4: 0.63; hgb a1c 8.0  05-15-16: tsh 71.63 free T 4: 0.33 06-18-16: tsh 4.63; free T 4: 1.20 free T 3: 1.7 08-20-16: glucose 116; bun 41.9; creat 1.50; k+ 4.4; na++ 145; ca 9.1 tsh 2.06 ast 11; hgb a1c 7.3; chol 142; ldl 63; trig 83; hdl 62; urine micro-albumin 1.6  10-21-16: wbc 6.1; hgb 10.1; hct 31.8; mcv 89.3; plt 296; glucose 245; bun 48; creat 1.5; k+ 5.0 na++ 150; ca 9.4 10-22-16; wbc 5.8; hgb 9.7; hct 30.8; mcv 89.8; plt 280; glucose 205; bun 36; creat 1.32; k+ 4.6; na++ 149; liver normal albumin 3.1  TODAY:  Wbc 6.3; hgb 9.5; hct 30.0; mcv 94.7; plt 287; glucose 72; bun 39.9; creat 1.56; k+ 4.9; na++ 142; ca 8.8; liver normal albumin 3.4   Review of Systems  Unable to perform ROS: Dementia (unable to answer questions )   Physical Exam  Constitutional: No distress.  Eyes: Conjunctivae are normal.  Neck: Neck supple. No JVD present. No thyromegaly present.  Cardiovascular: Normal rate, regular rhythm and intact distal pulses.   Murmur heard. 1/6  Respiratory: Effort normal and breath sounds normal. No respiratory distress. She has no wheezes.  GI: Soft. Bowel sounds are normal. She exhibits no distension. There is no tenderness.  Musculoskeletal: She exhibits no edema.  Able to move all extremities   Lymphadenopathy:    She has no cervical adenopathy.  Neurological: She is alert.  Skin: Skin is warm and dry. She is not diaphoretic.  Left axilla: 0.7 x 0.3 cm   Psychiatric:  She has a normal mood and affect.    ASSESSMENT/ PLAN:  TODAY:   1. Diabetes  Is stable hgb a1c 7.3( previous  8.0) ; will continue; levemir 5 units nightly  novolog  3 units for cbg >200   is on statin not on ace/arb due to poor renal function her tradjenta has been stopped.   2. Hypothyroidism: is stable  tsh is 2.06  Will continue synthroid 137 mcg at this time.    3. Dyslipidemia; is stable  ldl 63; will continue lipitor 20 mg daily   4. Hypertension:  Stable   will continue norvasc 10 mg daily    her lisinopril and hctz were stopped in the hospital.due to poor renal function  PREVIOUS  5. Cardiac arrhythmia: stable  has a history of left arm dvt: heart rate is stable; will continue xarelto 20 mg daily  Has had a left axilla hematoma.   6. Gerd: stable  will continue prilosec 20 mg daily   7. Dementia: no change:  her current weight is 160 pounds; will continue namzaric 28-10 mg daily  8. Depression with anxiety: stable  will continue zoloft 100 mg daily and is taking ativan 0.5 mg in the AM and 1 mg in the PM for anxiety.   9. Psychosis no change in status : is on seroquel 25 mg in the AM and 50 mg in the PM.   10. Stage IV CKD: has had acute on chronic renal failure: bun 39.9; creat 1.56    MD is aware of resident's narcotic use and is in agreement with current plan of care. We will attempt to wean resident as apropriate     Rj Pedrosa NP [facility]  Contact [phone] Monday through Friday 8am- 5pm  After hours call [phone]

## 2016-11-15 NOTE — Telephone Encounter (Signed)
RX faxed to [facility] @ [phone], phone number [phone] 

## 2016-11-27 ENCOUNTER — Non-Acute Institutional Stay (SKILLED_NURSING_FACILITY): Payer: Medicare Other | Admitting: Adult Health

## 2016-11-27 ENCOUNTER — Encounter: Payer: Self-pay | Admitting: Adult Health

## 2016-11-27 DIAGNOSIS — A0472 Enterocolitis due to Clostridium difficile, not specified as recurrent: Secondary | ICD-10-CM | POA: Diagnosis not present

## 2016-11-27 NOTE — Progress Notes (Signed)
Location:   [facility] Room Number: 202 B Place of Service:      CODE STATUS: DNR  No Known Allergies  Chief Complaint  Patient presents with  . Acute Visit    C-Diff    HPI:  She has had abnormal stools; was tested and was found to be positive for c-diff. She is unable to participate in the hpi or ros. There are no reports of fever present. There are no reports of any changes in appetite or fluid intake. She will need to be place in isolation   Past Medical History:  Diagnosis Date  . CKD (chronic kidney disease)   . Dementia   . Diabetes mellitus without complication (HCC)   . Hypertension   . Hypothyroid   . Hypothyroid   . Thyroid disease     Past Surgical History:  Procedure Laterality Date  . ABDOMINAL HYSTERECTOMY    . FRACTURE SURGERY    . INCISION AND DRAINAGE ABSCESS Left 10/22/2016   Procedure: INCISION AND DRAINAGE LEFT AXILLARY  ABSCESS;  Surgeon: Glenna Fellows, MD;  Location: [facility];  Service: General;  Laterality: Left;    Social History   Social History  . Marital status: Widowed    Spouse name: N/A  . Number of children: N/A  . Years of education: N/A   Occupational History  . Not on file.   Social History Main Topics  . Smoking status: Never Smoker  . Smokeless tobacco: Never Used  . Alcohol use No  . Drug use: No  . Sexual activity: Not Currently   Other Topics Concern  . Not on file   Social History Narrative  . No narrative on file   History reviewed. No pertinent family history.    VITAL SIGNS BP (!) 120/58   Pulse (!) 58   Temp (!) 97.5 F (36.4 C)   Resp 18   Ht  (1.6 m)   Wt 160 lb (72.6 kg)   SpO2 96%   BMI 28.34 kg/m   Patient's Medications  New Prescriptions   No medications on file  Previous Medications   ACETAMINOPHEN 500 MG COAPSULE    Take 500 mg by mouth 3 (three) times daily.   ACETAMINOPHEN 500 MG COAPSULE    Take 500 mg by mouth every 6 (six) hours as needed for fever  (headache and minor discomfort).   AMLODIPINE (NORVASC) 10 MG TABLET    Take 10 mg by mouth daily.   INSULIN DETEMIR (LEVEMIR) 100 UNIT/ML INJECTION    Inject 5 Units into the skin at bedtime.   LEVOTHYROXINE (SYNTHROID, LEVOTHROID) 137 MCG TABLET    Take 137 mcg by mouth daily.    LOPERAMIDE (IMODIUM) 2 MG CAPSULE    Take 2 mg by mouth every 8 (eight) hours as needed for diarrhea or loose stools.    LORAZEPAM (ATIVAN) 0.5 MG TABLET    Take 1 tablet (0.5 mg total) by mouth daily.   LORAZEPAM (ATIVAN) 1 MG TABLET    Take 1 tablet (1 mg total) by mouth at bedtime.   MAGNESIUM HYDROXIDE (MILK OF MAGNESIA) 400 MG/5ML SUSPENSION    Take 30 mLs by mouth at bedtime as needed for mild constipation.   MEMANTINE (NAMENDA) 10 MG TABLET    Take 10 mg by mouth 2 (two) times daily.   OMEPRAZOLE (PRILOSEC) 20 MG CAPSULE    Take 20 mg by mouth daily.   ONDANSETRON (ZOFRAN-ODT) 8 MG DISINTEGRATING TABLET    Take  8 mg by mouth every 8 (eight) hours as needed for nausea or vomiting.   QUETIAPINE (SEROQUEL) 50 MG TABLET    Take 25-50 mg by mouth 2 (two) times daily. Take 25 mg in the morning and 50 mg at bedtime   SERTRALINE (ZOLOFT) 100 MG TABLET    Take 100 mg by mouth daily.   VITAMIN B-12 (CYANOCOBALAMIN) 1000 MCG TABLET    Take 1,000 mcg by mouth daily.   XARELTO 20 MG TABS TABLET    Take 20 mg by mouth daily.  Modified Medications   No medications on file  Discontinued Medications   INSULIN ASPART (NOVOLOG) 100 UNIT/ML INJECTION    Inject 3 Units into the skin 3 (three) times daily before meals. Give before meals for blood sugar > 200.  Notify MD is blood sugar is less than 70 or greater than 450.  Hold Insulin if less than 70   NAMZARIC 28-10 MG CP24    Take 1 capsule by mouth daily.     SIGNIFICANT DIAGNOSTIC EXAMS  NO NEW EXAMS   LABS REVIEWED: PREVIOUS    03-01-16: wbc 5.3; hgb 13.1; hct 40.4; mcv 88.8; plt 228; glucose 211; bun 29; creat 1.84; k+ 4.3; na++ 138; tsh 30.991  04-15-16: wbc 4.9; hgb  12.7; hct 40.1; mcv 92.3; plt 200; glucose 169; bun 23.6; creat 1.21; k+ 4.4; na++ 141; liver normal albumin 3.8; hgb a1c 8.2 04-16-16: glucose 171; bun 19.3; creat 1.35; k+ 4.8; na++ 140; liver normal albumin 3.6; chol 190; ldl 112; trig 109; hdl 56 tsh 15.82; free T4: 0.63; hgb a1c 8.0  05-15-16: tsh 71.63 free T 4: 0.33 06-18-16: tsh 4.63; free T 4: 1.20 free T 3: 1.7 08-20-16: glucose 116; bun 41.9; creat 1.50; k+ 4.4; na++ 145; ca 9.1 tsh 2.06 ast 11; hgb a1c 7.3; chol 142; ldl 63; trig 83; hdl 62; urine micro-albumin 1.6  10-21-16: wbc 6.1; hgb 10.1; hct 31.8; mcv 89.3; plt 296; glucose 245; bun 48; creat 1.5; k+ 5.0 na++ 150; ca 9.4 10-22-16; wbc 5.8; hgb 9.7; hct 30.8; mcv 89.8; plt 280; glucose 205; bun 36; creat 1.32; k+ 4.6; na++ 149; liver normal albumin 3.1 10-24-16: Wbc 6.3; hgb 9.5; hct 30.0; mcv 94.7; plt 287; glucose 72; bun 39.9; creat 1.56; k+ 4.9; na++ 142; ca 8.8; liver normal albumin 3.4  TODAY:  11-26-16: stool for c-diff: +    Review of Systems  Unable to perform ROS: Dementia (unable to answer questions )   Physical Exam  Constitutional: No distress.  Thin   Eyes: Conjunctivae are normal.  Neck: Neck supple. No thyromegaly present.  Cardiovascular: Normal rate, regular rhythm and intact distal pulses.   Murmur heard. Pulmonary/Chest: Effort normal and breath sounds normal. No respiratory distress.  Abdominal: Soft. She exhibits no distension. There is no tenderness.  Bowel sounds hyperactive   Musculoskeletal: She exhibits no edema.  Able to move all extremities   Lymphadenopathy:    She has no cervical adenopathy.  Neurological: She is alert.  Skin: Skin is warm and dry. She is not diaphoretic.    ASSESSMENT/ PLAN:  TODAY:  1. c-diff infection: will begin vancomycin 125 mg every 6 hours for 10 days and florastor twice daily for 3 months will monitor her status.        Synthia Innocent NP [facility]  Contact [phone] Monday through Friday 8am-  5pm  After hours call [phone]

## 2016-11-28 ENCOUNTER — Encounter: Payer: Self-pay | Admitting: Adult Health

## 2016-11-28 ENCOUNTER — Non-Acute Institutional Stay (SKILLED_NURSING_FACILITY): Payer: Medicare Other | Admitting: Adult Health

## 2016-11-28 DIAGNOSIS — Z794 Long term (current) use of insulin: Secondary | ICD-10-CM

## 2016-11-28 DIAGNOSIS — E1122 Type 2 diabetes mellitus with diabetic chronic kidney disease: Secondary | ICD-10-CM

## 2016-11-28 DIAGNOSIS — N184 Chronic kidney disease, stage 4 (severe): Secondary | ICD-10-CM | POA: Diagnosis not present

## 2016-11-28 NOTE — Progress Notes (Signed)
Location:   [facility] Room Number: 202 B Place of Service:  SNF (31)   CODE STATUS:  DNR  No Known Allergies  Chief Complaint  Patient presents with  . Acute Visit    diabetes     HPI:  She is being seen for the management of her diabetes. Her cbgs are all ok. She is taking levemir 5 units nightly her hgb a1c is 8.0. She is unable to fully participate in the hpi or ros. There are no reports of hyper or hypo glycemia. There are reports of excessive thirst or urination. There are no nursing concerns at this time.    Past Medical History:  Diagnosis Date  . CKD (chronic kidney disease)   . Dementia   . Diabetes mellitus without complication (HCC)   . Hypertension   . Hypothyroid   . Hypothyroid   . Thyroid disease     Past Surgical History:  Procedure Laterality Date  . ABDOMINAL HYSTERECTOMY    . FRACTURE SURGERY    . INCISION AND DRAINAGE ABSCESS Left 10/22/2016   Procedure: INCISION AND DRAINAGE LEFT AXILLARY  ABSCESS;  Surgeon: Glenna Fellows, MD;  Location: [facility];  Service: General;  Laterality: Left;    Social History   Social History  . Marital status: Widowed    Spouse name: N/A  . Number of children: N/A  . Years of education: N/A   Occupational History  . Not on file.   Social History Main Topics  . Smoking status: Never Smoker  . Smokeless tobacco: Never Used  . Alcohol use No  . Drug use: No  . Sexual activity: Not Currently   Other Topics Concern  . Not on file   Social History Narrative  . No narrative on file   History reviewed. No pertinent family history.    VITAL SIGNS BP (!) 120/58   Pulse (!) 58   Temp (!) 97.5 F (36.4 C)   Resp 18   Ht  (1.6 m)   Wt 160 lb (72.6 kg)   SpO2 96%   BMI 28.34 kg/m   Patient's Medications  New Prescriptions   No medications on file  Previous Medications   ACETAMINOPHEN 500 MG COAPSULE    Take 500 mg by mouth 3 (three) times daily.   ACETAMINOPHEN 500 MG COAPSULE     Take 500 mg by mouth every 6 (six) hours as needed for fever (headache and minor discomfort).   AMLODIPINE (NORVASC) 10 MG TABLET    Take 10 mg by mouth daily.   INSULIN DETEMIR (LEVEMIR) 100 UNIT/ML INJECTION    Inject 5 Units into the skin at bedtime.   LEVOTHYROXINE (SYNTHROID, LEVOTHROID) 137 MCG TABLET    Take 137 mcg by mouth daily.    LOPERAMIDE (IMODIUM) 2 MG CAPSULE    Take 2 mg by mouth every 8 (eight) hours as needed for diarrhea or loose stools.    LORAZEPAM (ATIVAN) 0.5 MG TABLET    Take 1 tablet (0.5 mg total) by mouth daily.   LORAZEPAM (ATIVAN) 1 MG TABLET    Take 1 tablet (1 mg total) by mouth at bedtime.   MAGNESIUM HYDROXIDE (MILK OF MAGNESIA) 400 MG/5ML SUSPENSION    Take 30 mLs by mouth at bedtime as needed for mild constipation.   MEMANTINE (NAMENDA) 10 MG TABLET    Take 10 mg by mouth 2 (two) times daily.   OMEPRAZOLE (PRILOSEC) 20 MG CAPSULE    Take 20 mg by  mouth daily.   ONDANSETRON (ZOFRAN-ODT) 8 MG DISINTEGRATING TABLET    Take 8 mg by mouth every 8 (eight) hours as needed for nausea or vomiting.   QUETIAPINE (SEROQUEL) 50 MG TABLET    Take 25-50 mg by mouth 2 (two) times daily. Take 25 mg in the morning and 50 mg at bedtime   SERTRALINE (ZOLOFT) 100 MG TABLET    Take 100 mg by mouth daily.   VITAMIN B-12 (CYANOCOBALAMIN) 1000 MCG TABLET    Take 1,000 mcg by mouth daily.   XARELTO 20 MG TABS TABLET    Take 20 mg by mouth daily.  Modified Medications   No medications on file  Discontinued Medications   NAMZARIC 28-10 MG CP24    Take 1 capsule by mouth daily.     SIGNIFICANT DIAGNOSTIC EXAMS  NO NEW EXAMS   LABS REVIEWED: PREVIOUS    03-01-16: wbc 5.3; hgb 13.1; hct 40.4; mcv 88.8; plt 228; glucose 211; bun 29; creat 1.84; k+ 4.3; na++ 138; tsh 30.991  04-15-16: wbc 4.9; hgb 12.7; hct 40.1; mcv 92.3; plt 200; glucose 169; bun 23.6; creat 1.21; k+ 4.4; na++ 141; liver normal albumin 3.8; hgb a1c 8.2 04-16-16: glucose 171; bun 19.3; creat 1.35; k+ 4.8; na++ 140;  liver normal albumin 3.6; chol 190; ldl 112; trig 109; hdl 56 tsh 15.82; free T4: 0.63; hgb a1c 8.0  05-15-16: tsh 71.63 free T 4: 0.33 06-18-16: tsh 4.63; free T 4: 1.20 free T 3: 1.7 08-20-16: glucose 116; bun 41.9; creat 1.50; k+ 4.4; na++ 145; ca 9.1 tsh 2.06 ast 11; hgb a1c 7.3; chol 142; ldl 63; trig 83; hdl 62; urine micro-albumin 1.6  10-21-16: wbc 6.1; hgb 10.1; hct 31.8; mcv 89.3; plt 296; glucose 245; bun 48; creat 1.5; k+ 5.0 na++ 150; ca 9.4 10-22-16; wbc 5.8; hgb 9.7; hct 30.8; mcv 89.8; plt 280; glucose 205; bun 36; creat 1.32; k+ 4.6; na++ 149; liver normal albumin 3.1 11-29-16: Wbc 6.3; hgb 9.5; hct 30.0; mcv 94.7; plt 287; glucose 72; bun 39.9; creat 1.56; k+ 4.9; na++ 142; ca 8.8; liver normal albumin 3.4  NO NEW LABS     Review of Systems  Unable to perform ROS: Dementia (unable to answer questions )   Physical Exam  Constitutional: No distress.  Eyes: Conjunctivae are normal.  Neck: Neck supple. No thyromegaly present.  Cardiovascular: Normal rate, regular rhythm and intact distal pulses.   Murmur heard. 1/6  Pulmonary/Chest: Effort normal and breath sounds normal. No respiratory distress.  Abdominal: Soft. Bowel sounds are normal. She exhibits no distension. There is no tenderness.  Musculoskeletal: She exhibits no edema.  Is able to move all extremities   Lymphadenopathy:    She has no cervical adenopathy.  Neurological: She is alert.  Skin: Skin is warm and dry. She is not diaphoretic.  Left axilla: 0.7 x 0.3 cm     Psychiatric: She has a normal mood and affect.   ASSESSMENT/ PLAN:  TODAY:   1. Diabetes  Is stable hgb a1c 7.3( previous  8.0) ; will continue; levemir 5 units nightly    is on statin not on ace/arb due to poor renal function her tradjenta has been stopped.   Micro albumin 1.6   Diabetic foot exam performed  Will check hgb a1c    Synthia Innocent NP [facility]  Contact [phone] Monday through Friday 8am- 5pm  After hours call  [phone]

## 2016-11-29 LAB — HEMOGLOBIN A1C: HEMOGLOBIN A1C: 7.4

## 2016-12-07 DIAGNOSIS — A0472 Enterocolitis due to Clostridium difficile, not specified as recurrent: Secondary | ICD-10-CM | POA: Insufficient documentation

## 2016-12-09 ENCOUNTER — Encounter: Payer: Self-pay | Admitting: Internal Medicine

## 2016-12-09 ENCOUNTER — Non-Acute Institutional Stay (SKILLED_NURSING_FACILITY): Payer: Medicare Other | Admitting: Internal Medicine

## 2016-12-09 DIAGNOSIS — E034 Atrophy of thyroid (acquired): Secondary | ICD-10-CM

## 2016-12-09 DIAGNOSIS — E1122 Type 2 diabetes mellitus with diabetic chronic kidney disease: Secondary | ICD-10-CM | POA: Diagnosis not present

## 2016-12-09 DIAGNOSIS — I499 Cardiac arrhythmia, unspecified: Secondary | ICD-10-CM | POA: Diagnosis not present

## 2016-12-09 DIAGNOSIS — N184 Chronic kidney disease, stage 4 (severe): Secondary | ICD-10-CM | POA: Diagnosis not present

## 2016-12-09 DIAGNOSIS — F0151 Vascular dementia with behavioral disturbance: Secondary | ICD-10-CM

## 2016-12-09 DIAGNOSIS — E1159 Type 2 diabetes mellitus with other circulatory complications: Secondary | ICD-10-CM

## 2016-12-09 DIAGNOSIS — I82722 Chronic embolism and thrombosis of deep veins of left upper extremity: Secondary | ICD-10-CM

## 2016-12-09 DIAGNOSIS — E1169 Type 2 diabetes mellitus with other specified complication: Secondary | ICD-10-CM | POA: Diagnosis not present

## 2016-12-09 DIAGNOSIS — S40022S Contusion of left upper arm, sequela: Secondary | ICD-10-CM

## 2016-12-09 DIAGNOSIS — F01518 Vascular dementia, unspecified severity, with other behavioral disturbance: Secondary | ICD-10-CM

## 2016-12-09 DIAGNOSIS — E785 Hyperlipidemia, unspecified: Secondary | ICD-10-CM | POA: Diagnosis not present

## 2016-12-09 DIAGNOSIS — Z794 Long term (current) use of insulin: Secondary | ICD-10-CM

## 2016-12-09 DIAGNOSIS — F29 Unspecified psychosis not due to a substance or known physiological condition: Secondary | ICD-10-CM | POA: Diagnosis not present

## 2016-12-09 DIAGNOSIS — I10 Essential (primary) hypertension: Secondary | ICD-10-CM

## 2016-12-09 NOTE — Progress Notes (Signed)
Patient ID: Cynthia [name], female   DOB: 11-14-1927, 83 y.o.   MRN: [id number]     DATE:  December 09, 2016  Location:   [facility] Room Number: [place] of Service: SNF (31)   Extended Emergency Contact Information Primary Emergency Contact: Elmore,[name] Address: [place] Phone: [phone] Relation: Daughter  Advanced Directive information Does Patient Have a Medical Advance Directive?: Yes, Type of Advance Directive: Out of facility DNR (pink MOST or yellow form), Pre-existing out of facility DNR order (yellow form or pink MOST form): Pink MOST form placed in chart (order not valid for inpatient use);Yellow form placed in chart (order not valid for inpatient use), Does patient want to make changes to medical advance directive?: No - Patient declined  Chief Complaint  Patient presents with  . Medical Management of Chronic Issues    1 month follow up    HPI:  83 yo female long term resident seen today for f/u. She has no c/o. She has delusions. No nursing issues. No recent falls. Appetite reduced. Sleeps well. She is a poor historian due to dementia/psychosis. Hx obtained from chart  DM - borderline controlled. A1c 7.4%. CBGs 120-250s. No low BS reactions. Takes levemir 5 units nightly;  novolog  3 units for cbg >200. LDL 63. She is off ACEI/ARB 2/2 reduced rena fxn. Urine microalbumin/Cr ratio 8.2. She no longer takes tradjenta  Hypothyroidism - stable on synthroid 137 mcg daily. TSH 2.06   Dyslipidemia - stable on lipitor 20 mg daily. LDL 63  Hypertension - BP stable on norvasc 10 mg daily. No longer on ACEI or HCTZ 2/2 reduced renal fxn  Hx Cardiac arrhythmia - rate controlled without med. Takes xarelto 20 mg daily for anticoagulation.   Hx DVT LUE - stable on xeralto 80m daily. She has a left axilla hematoma  GERD - stable on prilosec 20 mg daily   Dementia with psychosis - unchanged. She takes namenda  BID. She has psychosis and takes seroquel 25 mg in the AM and 50 mg in the PM. She does benefit from her antipsychotic. Albumin 3.4. Weight up 4 lbs since last month.  Depression with anxiety - mood stable on zoloft 100 mg daily; ativan 0.5 mg in the AM and 1 mg in the PM for anxiety.   CKD - stage 4. Stable. Cr 1.56  Past Medical History:  Diagnosis Date  . CKD (chronic kidney disease)   . Dementia   . [place]   . Hypertension   . Hypothyroid   . Hypothyroid   . Thyroid disease     Past Surgical History:  Procedure Laterality Date  . ABDOMINAL HYSTERECTOMY    . FRACTURE SURGERY    . INCISION AND DRAINAGE ABSCESS Left 10/22/2016   Procedure: INCISION AND DRAINAGE LEFT AXILLARY  ABSCESS;  Surgeon: HExcell Seltzer MD;  Location: [facility];  Service: General;  Laterality: Left;    Patient Care Team: CGildardo Cranker DO as PCP - General (Internal Medicine)  Social History   Social History  . Marital status: Widowed    Spouse name: N/A  . Number of children: N/A  . Years of education: N/A   Occupational History  . Not on file.   Social History Main Topics  . Smoking status: Never Smoker  . Smokeless tobacco: Never Used  . Alcohol use No  . Drug use: No  . Sexual activity: Not Currently  Other Topics Concern  . Not on file   Social History Narrative  . No narrative on file     reports that she has never smoked. She has never used smokeless tobacco. She reports that she does not drink alcohol or use drugs.  History reviewed. No pertinent family history. No family status information on file.    Immunization History  Administered Date(s) Administered  . PPD Test 07/13/2016    No Known Allergies  Medications: Patient's Medications  New Prescriptions   No medications on file  Previous Medications   ACETAMINOPHEN 500 MG COAPSULE    Take 500 mg by mouth 3 (three) times daily.   ACETAMINOPHEN 500 MG COAPSULE    Take 500 mg  by mouth every 6 (six) hours as needed for fever (headache and minor discomfort).   AMLODIPINE (NORVASC) 10 MG TABLET    Take 10 mg by mouth daily.   INSULIN DETEMIR (LEVEMIR) 100 UNIT/ML INJECTION    Inject 5 Units into the skin at bedtime.   LEVOTHYROXINE (SYNTHROID, LEVOTHROID) 137 MCG TABLET    Take 137 mcg by mouth daily.    LOPERAMIDE (IMODIUM) 2 MG CAPSULE    Take 2 mg by mouth every 8 (eight) hours as needed for diarrhea or loose stools.    LORAZEPAM (ATIVAN) 0.5 MG TABLET    Take 1 tablet (0.5 mg total) by mouth daily.   MAGNESIUM HYDROXIDE (MILK OF MAGNESIA) 400 MG/5ML SUSPENSION    Take 30 mLs by mouth at bedtime as needed for mild constipation.   MEMANTINE (NAMENDA) 10 MG TABLET    Take 10 mg by mouth 2 (two) times daily.   OMEPRAZOLE (PRILOSEC) 20 MG CAPSULE    Take 20 mg by mouth daily.   ONDANSETRON (ZOFRAN-ODT) 8 MG DISINTEGRATING TABLET    Take 8 mg by mouth every 8 (eight) hours as needed for nausea or vomiting.   PROBIOTIC CAPS    Take 1 capsule by mouth 2 (two) times daily.   QUETIAPINE (SEROQUEL) 50 MG TABLET    Take 25-50 mg by mouth 2 (two) times daily. Take 25 mg in the morning and 50 mg at bedtime   SERTRALINE (ZOLOFT) 100 MG TABLET    Take 100 mg by mouth daily.   VITAMIN B-12 (CYANOCOBALAMIN) 1000 MCG TABLET    Take 1,000 mcg by mouth daily.   XARELTO 20 MG TABS TABLET    Take 20 mg by mouth daily.  Modified Medications   Modified Medication Previous Medication   LORAZEPAM (ATIVAN) 1 MG TABLET LORazepam (ATIVAN) 1 MG tablet      Take 1 tablet (1 mg total) by mouth at bedtime.    Take 1 tablet (1 mg total) by mouth at bedtime.  Discontinued Medications   No medications on file    Review of Systems  Unable to perform ROS: Dementia    Vitals:   12/09/16 1404  BP: (!) 120/58  Pulse: (!) 58  Temp: (!) 97.5 F (36.4 C)  Weight: 164 lb 4.8 oz (74.5 kg)  Height: 5' 3" (1.6 m)   Body mass index is 29.1 kg/m.  Physical Exam  Constitutional: She appears  well-developed.  Frail appearing, lying in bed in NAD  HENT:  Mouth/Throat: Oropharynx is clear and moist. No oropharyngeal exudate.  MM dry; no oral lesions/thrush  Eyes: Pupils are equal, round, and reactive to light. No scleral icterus.  Neck: Neck supple. Carotid bruit is not present. No tracheal deviation present. No thyromegaly present.  Cardiovascular: Normal rate, regular rhythm and intact distal pulses.  Exam reveals no gallop and no friction rub.   Murmur (1/6 SEM) heard. LUE lymphedema; no LE swelling b/l. No calf TTP  Pulmonary/Chest: Effort normal and breath sounds normal. No stridor. No respiratory distress. She has no wheezes. She has no rales.  Abdominal: Soft. Normal appearance and bowel sounds are normal. She exhibits no distension and no mass. There is no hepatomegaly. There is no tenderness. There is no rigidity, no rebound and no guarding. No hernia.  Musculoskeletal: She exhibits edema.  Lymphadenopathy:    She has no cervical adenopathy.  Neurological: She is alert.  Skin: Skin is warm and dry. No rash noted.  Psychiatric: She has a normal mood and affect. Her behavior is normal. Thought content is delusional.     Labs reviewed: Abstract on 12/02/2016  Component Date Value Ref Range Status  . Hemoglobin A1C 11/29/2016 7.4   Final  Abstract on 10/25/2016  Component Date Value Ref Range Status  . Hemoglobin 10/24/2016 9.5* 12.0 - 16.0 Final  . HCT 10/24/2016 30* 36 - 46 Final  . Neutrophils Absolute 10/24/2016 5   Final  . Platelets 10/24/2016 287  150 - 399 Final  . WBC 10/24/2016 6.3   Final  . Glucose 10/24/2016 172   Final  . BUN 10/24/2016 40* 4 - 21 Final  . Creatinine 10/24/2016 1.6* 0.5 - 1.1 Final  . Potassium 10/24/2016 4.9  3.4 - 5.3 Final  . Sodium 10/24/2016 142  137 - 147 Final  . Alkaline Phosphatase 10/24/2016 101  25 - 125 Final  . ALT 10/24/2016 11  7 - 35 Final  . AST 10/24/2016 12* 13 - 35 Final  . Bilirubin, Total 10/24/2016 0.2    Final  Admission on 10/21/2016, Discharged on 10/23/2016  Component Date Value Ref Range Status  . WBC 10/21/2016 6.1  4.0 - 10.5 K/uL Final  . RBC 10/21/2016 3.56* 3.87 - 5.11 MIL/uL Final  . Hemoglobin 10/21/2016 10.1* 12.0 - 15.0 g/dL Final  . HCT 10/21/2016 31.8* 36.0 - 46.0 % Final  . MCV 10/21/2016 89.3  78.0 - 100.0 fL Final  . MCH 10/21/2016 28.4  26.0 - 34.0 pg Final  . MCHC 10/21/2016 31.8  30.0 - 36.0 g/dL Final  . RDW 10/21/2016 14.6  11.5 - 15.5 % Final  . Platelets 10/21/2016 296  150 - 400 K/uL Final  . Neutrophils Relative % 10/21/2016 70  % Final  . Neutro Abs 10/21/2016 4.2  1.7 - 7.7 K/uL Final  . Lymphocytes Relative 10/21/2016 23  % Final  . Lymphs Abs 10/21/2016 1.4  0.7 - 4.0 K/uL Final  . Monocytes Relative 10/21/2016 5  % Final  . Monocytes Absolute 10/21/2016 0.3  0.1 - 1.0 K/uL Final  . Eosinophils Relative 10/21/2016 2  % Final  . Eosinophils Absolute 10/21/2016 0.1  0.0 - 0.7 K/uL Final  . Basophils Relative 10/21/2016 0  % Final  . Basophils Absolute 10/21/2016 0.0  0.0 - 0.1 K/uL Final  . Sodium 10/21/2016 150* 135 - 145 mmol/L Final  . Potassium 10/21/2016 5.0  3.5 - 5.1 mmol/L Final  . Chloride 10/21/2016 120* 101 - 111 mmol/L Final  . CO2 10/21/2016 24  22 - 32 mmol/L Final  . Glucose, Bld 10/21/2016 245* 65 - 99 mg/dL Final  . BUN 10/21/2016 48* 6 - 20 mg/dL Final  . Creatinine, Ser 10/21/2016 1.50* 0.44 - 1.00 mg/dL Final  . Calcium 10/21/2016 9.4  8.9 - 10.3 mg/dL Final  . GFR calc non Af Amer 10/21/2016 30* >60 mL/min Final  . GFR calc Af Amer 10/21/2016 35* >60 mL/min Final   Comment: (NOTE) The eGFR has been calculated using the CKD EPI equation. This calculation has not been validated in all clinical situations. eGFR's persistently <60 mL/min signify possible Chronic Kidney Disease.   . Anion gap 10/21/2016 6  5 - 15 Final  . Lactic Acid, Venous 10/21/2016 1.43  0.5 - 1.9 mmol/L Final  . Sodium 10/22/2016 149* 135 - 145 mmol/L Final  .  Potassium 10/22/2016 4.6  3.5 - 5.1 mmol/L Final  . Chloride 10/22/2016 117* 101 - 111 mmol/L Final  . CO2 10/22/2016 25  22 - 32 mmol/L Final  . Glucose, Bld 10/22/2016 204* 65 - 99 mg/dL Final  . BUN 10/22/2016 36* 6 - 20 mg/dL Final  . Creatinine, Ser 10/22/2016 1.32* 0.44 - 1.00 mg/dL Final  . Calcium 10/22/2016 9.0  8.9 - 10.3 mg/dL Final  . Total Protein 10/22/2016 6.8  6.5 - 8.1 g/dL Final  . Albumin 10/22/2016 3.1* 3.5 - 5.0 g/dL Final  . AST 10/22/2016 14* 15 - 41 U/L Final  . ALT 10/22/2016 16  14 - 54 U/L Final  . Alkaline Phosphatase 10/22/2016 88  38 - 126 U/L Final  . Total Bilirubin 10/22/2016 0.5  0.3 - 1.2 mg/dL Final  . GFR calc non Af Amer 10/22/2016 35* >60 mL/min Final  . GFR calc Af Amer 10/22/2016 41* >60 mL/min Final   Comment: (NOTE) The eGFR has been calculated using the CKD EPI equation. This calculation has not been validated in all clinical situations. eGFR's persistently <60 mL/min signify possible Chronic Kidney Disease.   . Anion gap 10/22/2016 7  5 - 15 Final  . WBC 10/22/2016 5.8  4.0 - 10.5 K/uL Final  . RBC 10/22/2016 3.43* 3.87 - 5.11 MIL/uL Final  . Hemoglobin 10/22/2016 9.7* 12.0 - 15.0 g/dL Final  . HCT 10/22/2016 30.8* 36.0 - 46.0 % Final  . MCV 10/22/2016 89.8  78.0 - 100.0 fL Final  . MCH 10/22/2016 28.3  26.0 - 34.0 pg Final  . MCHC 10/22/2016 31.5  30.0 - 36.0 g/dL Final  . RDW 10/22/2016 14.8  11.5 - 15.5 % Final  . Platelets 10/22/2016 280  150 - 400 K/uL Final  . MRSA by PCR 10/21/2016 NEGATIVE  NEGATIVE Final   Comment:        The GeneXpert MRSA Assay (FDA approved for NASAL specimens only), is one component of a comprehensive MRSA colonization surveillance program. It is not intended to diagnose MRSA infection nor to guide or monitor treatment for MRSA infections.   . Glucose-Capillary 10/21/2016 131* 65 - 99 mg/dL Final  . Comment 1 10/21/2016 Notify RN   Final  . Glucose-Capillary 10/22/2016 155* 65 - 99 mg/dL Final  .  Specimen Description 10/22/2016 ABSCESS   Final  . Special Requests 10/22/2016 NONE   Final  . Gram Stain 10/22/2016    Final                   Value:NO WBC SEEN NO ORGANISMS SEEN   . Culture 10/22/2016    Final                   Value:No growth aerobically or anaerobically. Performed at [facility], [place] [address] [place], [place] [address]   . Report Status 10/22/2016 10/27/2016 FINAL   Final  .  Glucose-Capillary 10/22/2016 146* 65 - 99 mg/dL Final  . Comment 1 10/22/2016 Notify RN   Final  . Comment 2 10/22/2016 Document in Chart   Final  . Glucose-Capillary 10/22/2016 166* 65 - 99 mg/dL Final  . Sodium 10/23/2016 144  135 - 145 mmol/L Final  . Potassium 10/23/2016 4.8  3.5 - 5.1 mmol/L Final  . Chloride 10/23/2016 113* 101 - 111 mmol/L Final  . CO2 10/23/2016 24  22 - 32 mmol/L Final  . Glucose, Bld 10/23/2016 223* 65 - 99 mg/dL Final  . BUN 10/23/2016 45* 6 - 20 mg/dL Final  . Creatinine, Ser 10/23/2016 1.93* 0.44 - 1.00 mg/dL Final  . Calcium 10/23/2016 8.3* 8.9 - 10.3 mg/dL Final  . GFR calc non Af Amer 10/23/2016 22* >60 mL/min Final  . GFR calc Af Amer 10/23/2016 26* >60 mL/min Final   Comment: (NOTE) The eGFR has been calculated using the CKD EPI equation. This calculation has not been validated in all clinical situations. eGFR's persistently <60 mL/min signify possible Chronic Kidney Disease.   . Anion gap 10/23/2016 7  5 - 15 Final  . WBC 10/23/2016 5.4  4.0 - 10.5 K/uL Final  . RBC 10/23/2016 2.95* 3.87 - 5.11 MIL/uL Final  . Hemoglobin 10/23/2016 8.6* 12.0 - 15.0 g/dL Final  . HCT 10/23/2016 27.3* 36.0 - 46.0 % Final  . MCV 10/23/2016 92.5  78.0 - 100.0 fL Final  . MCH 10/23/2016 29.2  26.0 - 34.0 pg Final  . MCHC 10/23/2016 31.5  30.0 - 36.0 g/dL Final  . RDW 10/23/2016 15.2  11.5 - 15.5 % Final  . Platelets 10/23/2016 261  150 - 400 K/uL Final  . Glucose-Capillary 10/22/2016 218* 65 - 99 mg/dL Final  . Glucose-Capillary 10/22/2016 220* 65 - 99 mg/dL  Final  . Glucose-Capillary 10/23/2016 157* 65 - 99 mg/dL Final    No results found.   Assessment/Plan   ICD-10-CM   1. Vascular dementia with behavior disturbance F01.51   2. Psychosis, unspecified psychosis type ([place]) F29   3. Type 2 diabetes mellitus with stage 4 chronic kidney disease, with long-term current use of insulin (HCC) E11.22    N18.4    Z79.4   4. Axillary hematoma, left, sequela S40.022S   5. Hyperlipidemia associated with type 2 diabetes mellitus (HCC) E11.69    E78.5   6. Hypertension associated with diabetes ([place]) E11.59    I10   7. Cardiac arrhythmia, unspecified cardiac arrhythmia type I49.9   8. Hypothyroidism due to acquired atrophy of thyroid E03.4   9. Arm DVT (deep venous thromboembolism), chronic, left (HCC) I82.722     Cont current meds as ordered  PT/OT/St as indicated  F/u with specialists as scheduled  Will follow   S. Perlie Gold  [facility] [address] [place], [place] [address] [phone] Cell (Monday-Friday 8 AM - 5 PM) [phone] After 5 PM and follow prompts

## 2016-12-11 ENCOUNTER — Non-Acute Institutional Stay (SKILLED_NURSING_FACILITY): Payer: Medicare Other | Admitting: Adult Health

## 2016-12-11 ENCOUNTER — Encounter: Payer: Self-pay | Admitting: Adult Health

## 2016-12-11 DIAGNOSIS — F01518 Vascular dementia, unspecified severity, with other behavioral disturbance: Secondary | ICD-10-CM

## 2016-12-11 DIAGNOSIS — F0151 Vascular dementia with behavioral disturbance: Secondary | ICD-10-CM | POA: Diagnosis not present

## 2016-12-11 DIAGNOSIS — R1311 Dysphagia, oral phase: Secondary | ICD-10-CM

## 2016-12-11 NOTE — Progress Notes (Signed)
Location:   [facility] Room Number: 202 B Place of Service:  SNF (31)   CODE STATUS: DNR  No Known Allergies  Chief Complaint  Patient presents with  . Acute Visit    Care Plan Meeting    HPI:  We have come together with the care plan team and family for a routine care plan meeting. Her daughter is concerned about her diet/speech therapy needs. She is getting food stuck to the roof of her mouth especially bread and pasta. Her family is concerned that she may need a pureed diet. We have discussed her care plan; her current health status and we have reviewed her MOST form.  Her nursing concerns have been addressed. We will setup a speech therapy evaluation regarding a possible change in diet to pureed.    Past Medical History:  Diagnosis Date  . CKD (chronic kidney disease)   . Dementia   . Diabetes mellitus without complication (HCC)   . Hypertension   . Hypothyroid   . Hypothyroid   . Thyroid disease     Past Surgical History:  Procedure Laterality Date  . ABDOMINAL HYSTERECTOMY    . FRACTURE SURGERY    . INCISION AND DRAINAGE ABSCESS Left 10/22/2016   Procedure: INCISION AND DRAINAGE LEFT AXILLARY  ABSCESS;  Surgeon: Glenna Fellows, MD;  Location: [facility];  Service: General;  Laterality: Left;    Social History   Social History  . Marital status: Widowed    Spouse name: N/A  . Number of children: N/A  . Years of education: N/A   Occupational History  . Not on file.   Social History Main Topics  . Smoking status: Never Smoker  . Smokeless tobacco: Never Used  . Alcohol use No  . Drug use: No  . Sexual activity: Not Currently   Other Topics Concern  . Not on file   Social History Narrative  . No narrative on file   History reviewed. No pertinent family history.    VITAL SIGNS Ht  (1.6 m)   Wt 164 lb 4.8 oz (74.5 kg)   BMI 29.10 kg/m   Patient's Medications  New Prescriptions   No medications on file  Previous Medications     ACETAMINOPHEN 500 MG COAPSULE    Take 500 mg by mouth 3 (three) times daily.   ACETAMINOPHEN 500 MG COAPSULE    Take 500 mg by mouth every 6 (six) hours as needed for fever (headache and minor discomfort).   AMLODIPINE (NORVASC) 10 MG TABLET    Take 10 mg by mouth daily.   INSULIN DETEMIR (LEVEMIR) 100 UNIT/ML INJECTION    Inject 5 Units into the skin at bedtime.   LEVOTHYROXINE (SYNTHROID, LEVOTHROID) 137 MCG TABLET    Take 137 mcg by mouth daily.    LOPERAMIDE (IMODIUM) 2 MG CAPSULE    Take 2 mg by mouth every 8 (eight) hours as needed for diarrhea or loose stools.    LORAZEPAM (ATIVAN) 0.5 MG TABLET    Take 1 tablet (0.5 mg total) by mouth daily.   LORAZEPAM (ATIVAN) 1 MG TABLET    Take 1 tablet (1 mg total) by mouth at bedtime.   MAGNESIUM HYDROXIDE (MILK OF MAGNESIA) 400 MG/5ML SUSPENSION    Take 30 mLs by mouth at bedtime as needed for mild constipation.   MEMANTINE (NAMENDA) 10 MG TABLET    Take 10 mg by mouth 2 (two) times daily.   OMEPRAZOLE (PRILOSEC) 20 MG CAPSULE  Take 20 mg by mouth daily.   ONDANSETRON (ZOFRAN-ODT) 8 MG DISINTEGRATING TABLET    Take 8 mg by mouth every 8 (eight) hours as needed for nausea or vomiting.   PROBIOTIC CAPS    Take 1 capsule by mouth 2 (two) times daily.   QUETIAPINE (SEROQUEL) 50 MG TABLET    Take 25-50 mg by mouth 2 (two) times daily. Take 25 mg in the morning and 50 mg at bedtime   SERTRALINE (ZOLOFT) 100 MG TABLET    Take 100 mg by mouth daily.   VITAMIN B-12 (CYANOCOBALAMIN) 1000 MCG TABLET    Take 1,000 mcg by mouth daily.   XARELTO 20 MG TABS TABLET    Take 20 mg by mouth daily.  Modified Medications   No medications on file  Discontinued Medications   No medications on file     SIGNIFICANT DIAGNOSTIC EXAMS  NO NEW EXAMS   LABS REVIEWED: PREVIOUS    03-01-16: wbc 5.3; hgb 13.1; hct 40.4; mcv 88.8; plt 228; glucose 211; bun 29; creat 1.84; k+ 4.3; na++ 138; tsh 30.991  04-15-16: wbc 4.9; hgb 12.7; hct 40.1; mcv 92.3; plt 200; glucose  169; bun 23.6; creat 1.21; k+ 4.4; na++ 141; liver normal albumin 3.8; hgb a1c 8.2 04-16-16: glucose 171; bun 19.3; creat 1.35; k+ 4.8; na++ 140; liver normal albumin 3.6; chol 190; ldl 112; trig 109; hdl 56 tsh 15.82; free T4: 0.63; hgb a1c 8.0  05-15-16: tsh 71.63 free T 4: 0.33 06-18-16: tsh 4.63; free T 4: 1.20 free T 3: 1.7 08-20-16: glucose 116; bun 41.9; creat 1.50; k+ 4.4; na++ 145; ca 9.1 tsh 2.06 ast 11; hgb a1c 7.3; chol 142; ldl 63; trig 83; hdl 62; urine micro-albumin 1.6  10-21-16: wbc 6.1; hgb 10.1; hct 31.8; mcv 89.3; plt 296; glucose 245; bun 48; creat 1.5; k+ 5.0 na++ 150; ca 9.4 10-22-16; wbc 5.8; hgb 9.7; hct 30.8; mcv 89.8; plt 280; glucose 205; bun 36; creat 1.32; k+ 4.6; na++ 149; liver normal albumin 3.1 11-29-16: Wbc 6.3; hgb 9.5; hct 30.0; mcv 94.7; plt 287; glucose 72; bun 39.9; creat 1.56; k+ 4.9; na++ 142; ca 8.8; liver normal albumin 3.4  NO NEW LABS    Review of Systems  Unable to perform ROS: Dementia (unable to answer questions )   Physical Exam  Constitutional: No distress.  Eyes: Conjunctivae are normal.  Neck: Neck supple. No thyromegaly present.  Cardiovascular: Normal rate, regular rhythm and intact distal pulses.   Murmur heard. 1/6  Pulmonary/Chest: Effort normal and breath sounds normal. No respiratory distress.  Abdominal: Soft. Bowel sounds are normal. She exhibits no distension.  Musculoskeletal: She exhibits no edema.  Is able to move all extremities   Lymphadenopathy:    She has no cervical adenopathy.  Neurological: She is alert.  Skin: Skin is warm and dry. She is not diaphoretic.  Psychiatric: She has a normal mood and affect.     ASSESSMENT/ PLAN:  TODAY:   1. Dysphagia 2. Dementia  Will have speech therapy evaluate and treat as indicated for her food getting stuck in the roof of her mouth.   Time spent with care plan more than 35 minutes: discussed care plan her overall health status; dysphagia; medications; her family has verbalized  understanding.     MD is aware of resident's narcotic use and is in agreement with current plan of care. We will attempt to wean resident as apropriate     Synthia Innocent NP [facility]  Contact [phone] Monday through Friday 8am- 5pm  After hours call [phone]

## 2016-12-23 ENCOUNTER — Other Ambulatory Visit: Payer: Self-pay

## 2016-12-23 MED ORDER — LORAZEPAM 1 MG PO TABS: 1.0000 mg | ORAL_TABLET | Freq: Every day | ORAL | 0 refills | Status: DC

## 2016-12-23 NOTE — Telephone Encounter (Signed)
RX faxed to [facility] @ [phone], phone number [phone] 

## 2016-12-26 ENCOUNTER — Non-Acute Institutional Stay (SKILLED_NURSING_FACILITY): Payer: Medicare Other | Admitting: Adult Health

## 2016-12-26 ENCOUNTER — Encounter: Payer: Self-pay | Admitting: Adult Health

## 2016-12-26 ENCOUNTER — Other Ambulatory Visit: Payer: Self-pay

## 2016-12-26 DIAGNOSIS — Z794 Long term (current) use of insulin: Secondary | ICD-10-CM | POA: Diagnosis not present

## 2016-12-26 DIAGNOSIS — N184 Chronic kidney disease, stage 4 (severe): Secondary | ICD-10-CM

## 2016-12-26 DIAGNOSIS — E1122 Type 2 diabetes mellitus with diabetic chronic kidney disease: Secondary | ICD-10-CM

## 2016-12-26 MED ORDER — LORAZEPAM 1 MG PO TABS: 1.0000 mg | ORAL_TABLET | Freq: Every day | ORAL | 0 refills | Status: DC

## 2016-12-26 MED ORDER — LORAZEPAM 0.5 MG PO TABS: 0.5000 mg | ORAL_TABLET | Freq: Every day | ORAL | 0 refills | Status: DC

## 2016-12-26 NOTE — Telephone Encounter (Signed)
RX faxed to [facility] @ [phone], phone number [phone] 

## 2016-12-26 NOTE — Progress Notes (Signed)
Location:   [facility] Room Number: 202 B Place of Service:  SNF (31)   CODE STATUS: DNR  No Known Allergies  Chief Complaint  Patient presents with  . Acute Visit    Elevated Glucose levels    HPI:  Her cbg readings are elevated. They are nearly all above 200.  She has diabetes type II with stage IV renal disease. There are no reports of any recent infections present. She is unable to participate in the hpi or ros. There are no reports of excessive urination; no changes in appetite.   Past Medical History:  Diagnosis Date  . CKD (chronic kidney disease)   . Dementia   . Diabetes mellitus without complication (HCC)   . Hypertension   . Hypothyroid   . Hypothyroid   . Thyroid disease     Past Surgical History:  Procedure Laterality Date  . ABDOMINAL HYSTERECTOMY    . FRACTURE SURGERY    . INCISION AND DRAINAGE ABSCESS Left 10/22/2016   Procedure: INCISION AND DRAINAGE LEFT AXILLARY  ABSCESS;  Surgeon: Glenna FellowsHoxworth, Benjamin, MD;  Location: [facility];  Service: General;  Laterality: Left;    Social History   Social History  . Marital status: Widowed    Spouse name: N/A  . Number of children: N/A  . Years of education: N/A   Occupational History  . Not on file.   Social History Main Topics  . Smoking status: Never Smoker  . Smokeless tobacco: Never Used  . Alcohol use No  . Drug use: No  . Sexual activity: Not Currently   Other Topics Concern  . Not on file   Social History Narrative  . No narrative on file   History reviewed. No pertinent family history.    VITAL SIGNS BP 118/68   Pulse 78   Temp 98 F (36.7 C)   Resp 16   Ht 5\' 3"  (1.6 m)   Wt 159 lb 14.4 oz (72.5 kg)   BMI 28.33 kg/m   Patient's Medications  New Prescriptions   No medications on file  Previous Medications   ACETAMINOPHEN 500 MG COAPSULE    Take 500 mg by mouth 3 (three) times daily.   ACETAMINOPHEN 500 MG COAPSULE    Take 500 mg by mouth every 6 (six) hours as  needed for fever (headache and minor discomfort).   AMLODIPINE (NORVASC) 10 MG TABLET    Take 10 mg by mouth daily.   INSULIN DETEMIR (LEVEMIR) 100 UNIT/ML INJECTION    Inject 5 Units into the skin at bedtime.   LEVOTHYROXINE (SYNTHROID, LEVOTHROID) 137 MCG TABLET    Take 137 mcg by mouth daily.    LOPERAMIDE (IMODIUM) 2 MG CAPSULE    Take 2 mg by mouth every 8 (eight) hours as needed for diarrhea or loose stools.    LORAZEPAM (ATIVAN) 0.5 MG TABLET    Take 1 tablet (0.5 mg total) by mouth daily.   LORAZEPAM (ATIVAN) 1 MG TABLET    Take 1 tablet (1 mg total) by mouth at bedtime.   MAGNESIUM HYDROXIDE (MILK OF MAGNESIA) 400 MG/5ML SUSPENSION    Take 30 mLs by mouth at bedtime as needed for mild constipation.   MEMANTINE (NAMENDA) 10 MG TABLET    Take 10 mg by mouth 2 (two) times daily.   OMEPRAZOLE (PRILOSEC) 20 MG CAPSULE    Take 20 mg by mouth daily.   ONDANSETRON (ZOFRAN-ODT) 8 MG DISINTEGRATING TABLET    Take 8 mg by  mouth every 8 (eight) hours as needed for nausea or vomiting.   PROBIOTIC CAPS    Take 1 capsule by mouth 2 (two) times daily.   QUETIAPINE (SEROQUEL) 50 MG TABLET    Take 25-50 mg by mouth 2 (two) times daily. Take 25 mg in the morning and 50 mg at bedtime   SERTRALINE (ZOLOFT) 100 MG TABLET    Take 100 mg by mouth daily.   VITAMIN B-12 (CYANOCOBALAMIN) 1000 MCG TABLET    Take 1,000 mcg by mouth daily.   XARELTO 20 MG TABS TABLET    Take 20 mg by mouth daily.  Modified Medications   No medications on file  Discontinued Medications   No medications on file     SIGNIFICANT DIAGNOSTIC EXAMS   NO NEW EXAMS   LABS REVIEWED: PREVIOUS    03-01-16: wbc 5.3; hgb 13.1; hct 40.4; mcv 88.8; plt 228; glucose 211; bun 29; creat 1.84; k+ 4.3; na++ 138; tsh 30.991  04-15-16: wbc 4.9; hgb 12.7; hct 40.1; mcv 92.3; plt 200; glucose 169; bun 23.6; creat 1.21; k+ 4.4; na++ 141; liver normal albumin 3.8; hgb a1c 8.2 04-16-16: glucose 171; bun 19.3; creat 1.35; k+ 4.8; na++ 140; liver normal  albumin 3.6; chol 190; ldl 112; trig 109; hdl 56 tsh 15.82; free T4: 0.63; hgb a1c 8.0  05-15-16: tsh 71.63 free T 4: 0.33 06-18-16: tsh 4.63; free T 4: 1.20 free T 3: 1.7 08-20-16: glucose 116; bun 41.9; creat 1.50; k+ 4.4; na++ 145; ca 9.1 tsh 2.06 ast 11; hgb a1c 7.3; chol 142; ldl 63; trig 83; hdl 62; urine micro-albumin 1.6  10-21-16: wbc 6.1; hgb 10.1; hct 31.8; mcv 89.3; plt 296; glucose 245; bun 48; creat 1.5; k+ 5.0 na++ 150; ca 9.4 10-22-16; wbc 5.8; hgb 9.7; hct 30.8; mcv 89.8; plt 280; glucose 205; bun 36; creat 1.32; k+ 4.6; na++ 149; liver normal albumin 3.1 11-29-16: Wbc 6.3; hgb 9.5; hct 30.0; mcv 94.7; plt 287; glucose 72; bun 39.9; creat 1.56; k+ 4.9; na++ 142; ca 8.8; liver normal albumin 3.4  NO NEW LABS     Review of Systems  Unable to perform ROS: Dementia (unable to answer questions )   Physical Exam  Constitutional: No distress.  Neck: Neck supple. No thyromegaly present.  Cardiovascular: Normal rate, regular rhythm and intact distal pulses.  Murmur heard. 1/6  Pulmonary/Chest: Effort normal and breath sounds normal. No respiratory distress.  Abdominal: Soft. Bowel sounds are normal. She exhibits no distension. There is no tenderness.  Musculoskeletal: She exhibits no edema.  Able to move all extremities   Lymphadenopathy:    She has no cervical adenopathy.  Neurological: She is alert.  Skin: Skin is warm and dry. She is not diaphoretic.  Psychiatric: She has a normal mood and affect.    ASSESSMENT/ PLAN:   1. Diabetes cbg readings are worsehgb a1c 7.3( previous  8.0) ; will increase levemir to 10 units nightly and will increase novolog to 8 units after meals.  is on statin not on ace/arb due to poor renal function  Micro albumin 1.6     MD is aware of resident's narcotic use and is in agreement with current plan of care. We will attempt to wean resident as apropriate   Synthia Innocent NP [facility]  Contact [phone] Monday through Friday 8am-  5pm  After hours call [phone]

## 2016-12-30 DIAGNOSIS — R1311 Dysphagia, oral phase: Secondary | ICD-10-CM | POA: Insufficient documentation

## 2017-01-10 ENCOUNTER — Encounter: Payer: Self-pay | Admitting: Adult Health

## 2017-01-10 ENCOUNTER — Non-Acute Institutional Stay (SKILLED_NURSING_FACILITY): Payer: Medicare Other | Admitting: Adult Health

## 2017-01-10 DIAGNOSIS — F01518 Vascular dementia, unspecified severity, with other behavioral disturbance: Secondary | ICD-10-CM

## 2017-01-10 DIAGNOSIS — F0151 Vascular dementia with behavioral disturbance: Secondary | ICD-10-CM

## 2017-01-10 DIAGNOSIS — K219 Gastro-esophageal reflux disease without esophagitis: Secondary | ICD-10-CM

## 2017-01-10 DIAGNOSIS — I499 Cardiac arrhythmia, unspecified: Secondary | ICD-10-CM | POA: Diagnosis not present

## 2017-01-10 DIAGNOSIS — F418 Other specified anxiety disorders: Secondary | ICD-10-CM | POA: Diagnosis not present

## 2017-01-10 NOTE — Progress Notes (Signed)
Location:   [facility] Room Number: 202 B Place of Service:  SNF (31)   CODE STATUS: DNR  No Known Allergies  Chief Complaint  Patient presents with  . Medical Management of Chronic Issues    Cardiac arrhythmia; gerd; dementia; depression with anxiety     HPI:  She is a 83 year old long term resident of this facility being seen for the management of her chronic illnesses: cardiac arrhythmia; gerd without esophagitis; dementia with behavioral disturbance; depression with anxiety. She is unable to participate in the hpi or ros. There are no reports of behavioral changes; no changes in appetite; no indications of pain present. There are no nursing concerns at this time.   Past Medical History:  Diagnosis Date  . CKD (chronic kidney disease)   . Dementia   . Diabetes mellitus without complication (HCC)   . Hypertension   . Hypothyroid   . Hypothyroid   . Thyroid disease     Past Surgical History:  Procedure Laterality Date  . ABDOMINAL HYSTERECTOMY    . FRACTURE SURGERY    . INCISION AND DRAINAGE ABSCESS Left 10/22/2016   Procedure: INCISION AND DRAINAGE LEFT AXILLARY  ABSCESS;  Surgeon: Glenna FellowsHoxworth, Benjamin, MD;  Location: [facility];  Service: General;  Laterality: Left;    Social History   Social History  . Marital status: Widowed    Spouse name: N/A  . Number of children: N/A  . Years of education: N/A   Occupational History  . Not on file.   Social History Main Topics  . Smoking status: Never Smoker  . Smokeless tobacco: Never Used  . Alcohol use No  . Drug use: No  . Sexual activity: Not Currently   Other Topics Concern  . Not on file   Social History Narrative  . No narrative on file   History reviewed. No pertinent family history.    VITAL SIGNS BP 118/68   Pulse 76   Temp 97.7 F (36.5 C)   Resp 16   Ht 5\' 3"  (1.6 m)   Wt 159 lb 14.4 oz (72.5 kg)   SpO2 95%   BMI 28.33 kg/m   Patient's Medications  New Prescriptions   No  medications on file  Previous Medications   ACETAMINOPHEN 500 MG COAPSULE    Take 500 mg by mouth 3 (three) times daily.   ACETAMINOPHEN 500 MG COAPSULE    Take 500 mg by mouth every 6 (six) hours as needed for fever (headache and minor discomfort).   AMLODIPINE (NORVASC) 10 MG TABLET    Take 10 mg by mouth daily.   INSULIN ASPART (NOVOLOG) 100 UNIT/ML INJECTION    Inject as per sliding scale subcutaneously before meals: 0 - 149 = 0 units 150 - 450 = 5 units Notify MD if less than 70 or greater than 450   INSULIN DETEMIR (LEVEMIR) 100 UNIT/ML INJECTION    Inject 10 Units into the skin at bedtime.    LEVOTHYROXINE (SYNTHROID, LEVOTHROID) 137 MCG TABLET    Take 137 mcg by mouth daily.    LOPERAMIDE (IMODIUM) 2 MG CAPSULE    Take 2 mg by mouth every 8 (eight) hours as needed for diarrhea or loose stools.    LORAZEPAM (ATIVAN) 0.5 MG TABLET    Take 1 tablet (0.5 mg total) by mouth daily.   LORAZEPAM (ATIVAN) 1 MG TABLET    Take 1 tablet (1 mg total) by mouth at bedtime.   MAGNESIUM HYDROXIDE (MILK OF  MAGNESIA) 400 MG/5ML SUSPENSION    Take 30 mLs by mouth at bedtime as needed for mild constipation.   MEMANTINE (NAMENDA) 10 MG TABLET    Take 10 mg by mouth 2 (two) times daily.   MULTIPLE VITAMINS-MINERALS (DECUBI-VITE) CAPS    Take 1 capsule by mouth daily.   NUTRITIONAL SUPPLEMENTS (NUTRITIONAL SUPPLEMENT PO)    House Supplement - Give 120 ml by mouth daily for supplement   OMEPRAZOLE (PRILOSEC) 20 MG CAPSULE    Take 20 mg by mouth daily.   ONDANSETRON (ZOFRAN-ODT) 8 MG DISINTEGRATING TABLET    Take 8 mg by mouth every 8 (eight) hours as needed for nausea or vomiting.   PROBIOTIC CAPS    Take 1 capsule by mouth 2 (two) times daily.   QUETIAPINE (SEROQUEL) 50 MG TABLET    Take 25-50 mg by mouth 2 (two) times daily. Take 25 mg in the morning and 50 mg at bedtime   SERTRALINE (ZOLOFT) 100 MG TABLET    Take 100 mg by mouth daily.   VITAMIN B-12 (CYANOCOBALAMIN) 1000 MCG TABLET    Take 1,000 mcg by  mouth daily.   XARELTO 20 MG TABS TABLET    Take 20 mg by mouth daily.  Modified Medications   No medications on file  Discontinued Medications   No medications on file     SIGNIFICANT DIAGNOSTIC EXAMS   NO NEW EXAMS   LABS REVIEWED: PREVIOUS    03-01-16: wbc 5.3; hgb 13.1; hct 40.4; mcv 88.8; plt 228; glucose 211; bun 29; creat 1.84; k+ 4.3; na++ 138; tsh 30.991  04-15-16: wbc 4.9; hgb 12.7; hct 40.1; mcv 92.3; plt 200; glucose 169; bun 23.6; creat 1.21; k+ 4.4; na++ 141; liver normal albumin 3.8; hgb a1c 8.2 04-16-16: glucose 171; bun 19.3; creat 1.35; k+ 4.8; na++ 140; liver normal albumin 3.6; chol 190; ldl 112; trig 109; hdl 56 tsh 15.82; free T4: 0.63; hgb a1c 8.0  05-15-16: tsh 71.63 free T 4: 0.33 06-18-16: tsh 4.63; free T 4: 1.20 free T 3: 1.7 08-20-16: glucose 116; bun 41.9; creat 1.50; k+ 4.4; na++ 145; ca 9.1 tsh 2.06 ast 11; hgb a1c 7.3; chol 142; ldl 63; trig 83; hdl 62; urine micro-albumin 1.6  10-21-16: wbc 6.1; hgb 10.1; hct 31.8; mcv 89.3; plt 296; glucose 245; bun 48; creat 1.5; k+ 5.0 na++ 150; ca 9.4 10-22-16; wbc 5.8; hgb 9.7; hct 30.8; mcv 89.8; plt 280; glucose 205; bun 36; creat 1.32; k+ 4.6; na++ 149; liver normal albumin 3.1 11-29-16: Wbc 6.3; hgb 9.5; hct 30.0; mcv 94.7; plt 287; glucose 72; bun 39.9; creat 1.56; k+ 4.9; na++ 142; ca 8.8; liver normal albumin 3.4  NO NEW LABS    Review of Systems  Unable to perform ROS: Dementia (unable to answer questions )   Physical Exam  Constitutional: No distress.  Neck: Neck supple. No thyromegaly present.  Cardiovascular: Normal rate, regular rhythm and intact distal pulses.  Murmur heard. 1/6  Pulmonary/Chest: Effort normal. No respiratory distress.  Abdominal: Soft. Bowel sounds are normal. She exhibits no distension. There is no tenderness.  Musculoskeletal: She exhibits no edema.  Able to move all extremities   Lymphadenopathy:    She has no cervical adenopathy.  Neurological: She is alert.  Skin: Skin is warm  and dry. She is not diaphoretic.  Psychiatric: She has a normal mood and affect.    ASSESSMENT/ PLAN:  TODAY:   1. Cardiac arrhythmia: stable  has a history of left arm  dvt: heart rate is stable; will continue xarelto 20 mg daily  Has had a left axilla hematoma.   2. Genella Rife: stable  will continue prilosec 20 mg daily   3. Dementia: no change:  her current weight is 159 pounds; will continue namenda 10 mg twice daily   4. Depression with anxiety: stable  will continue zoloft 100 mg daily and is taking ativan 0.5 mg in the AM and 1 mg in the PM for anxiety.   PREVIOUS  5. Psychosis no change in status : is on seroquel 25 mg in the AM and 50 mg in the PM.   6. Stage IV CKD: is stable : bun 39.9; creat 1.56  7. Diabetes  Is stable hgb a1c 7.3( previous  8.0) ; will continue; levemir 10 units nightly and  novolog  5 units after meals    not on ace/arb due to poor renal function her tradjenta has been stopped.   8. Hypothyroidism: is stable  tsh is 2.06  Will continue synthroid 137 mcg at this time.    9. Dyslipidemia; is stable  ldl 63; is presently off lipitor   10. Hypertension:  Stable b/p 118/68  will continue norvasc 10 mg daily    her lisinopril and hctz were stopped in the hospital.due to poor renal function    MD is aware of resident's narcotic use and is in agreement with current plan of care. We will attempt to wean resident as apropriate   Synthia Innocent NP [facility]  Contact [phone] Monday through Friday 8am- 5pm  After hours call [phone]

## 2017-01-21 ENCOUNTER — Other Ambulatory Visit: Payer: Self-pay

## 2017-01-21 MED ORDER — LORAZEPAM 0.5 MG PO TABS: 0.5000 mg | ORAL_TABLET | Freq: Every day | ORAL | 0 refills | Status: DC

## 2017-01-21 MED ORDER — LORAZEPAM 1 MG PO TABS: 1.0000 mg | ORAL_TABLET | Freq: Every day | ORAL | 0 refills | Status: DC

## 2017-01-21 NOTE — Telephone Encounter (Signed)
RX faxed to [facility] @ [phone], phone number [phone] 

## 2017-01-29 ENCOUNTER — Other Ambulatory Visit: Payer: Self-pay

## 2017-01-29 MED ORDER — LORAZEPAM 0.5 MG PO TABS: 0.5000 mg | ORAL_TABLET | Freq: Every day | ORAL | 0 refills | Status: DC

## 2017-01-29 MED ORDER — LORAZEPAM 1 MG PO TABS: 1.0000 mg | ORAL_TABLET | Freq: Every day | ORAL | 0 refills | Status: DC

## 2017-01-29 NOTE — Telephone Encounter (Signed)
RX faxed to [facility] @ [phone], phone number [phone] 

## 2017-02-04 ENCOUNTER — Non-Acute Institutional Stay (SKILLED_NURSING_FACILITY): Payer: Medicare Other | Admitting: Adult Health

## 2017-02-04 ENCOUNTER — Encounter: Payer: Self-pay | Admitting: Adult Health

## 2017-02-04 DIAGNOSIS — N184 Chronic kidney disease, stage 4 (severe): Secondary | ICD-10-CM

## 2017-02-04 DIAGNOSIS — Z794 Long term (current) use of insulin: Secondary | ICD-10-CM | POA: Diagnosis not present

## 2017-02-04 DIAGNOSIS — E1122 Type 2 diabetes mellitus with diabetic chronic kidney disease: Secondary | ICD-10-CM

## 2017-02-04 NOTE — Progress Notes (Signed)
Location:   [facility] Room Number: 202 B Place of Service:  SNF (31)   CODE STATUS: DNR  No Known Allergies  Chief Complaint  Patient presents with  . Acute Visit    Diabetes Mellitus    HPI:  Staff report that her cbgs have all been above 200 for the past several days. She is unable to participate in the hpi or ros. There are no reports of altered mental status; no changes in appetite; no changes in behaviors.   Past Medical History:  Diagnosis Date  . CKD (chronic kidney disease)   . Dementia   . Diabetes mellitus without complication (HCC)   . Hypertension   . Hypothyroid   . Hypothyroid   . Thyroid disease     Past Surgical History:  Procedure Laterality Date  . ABDOMINAL HYSTERECTOMY    . FRACTURE SURGERY    . INCISION AND DRAINAGE ABSCESS Left 10/22/2016   Procedure: INCISION AND DRAINAGE LEFT AXILLARY  ABSCESS;  Surgeon: Glenna FellowsHoxworth, Benjamin, MD;  Location: [facility];  Service: General;  Laterality: Left;    Social History   Socioeconomic History  . Marital status: Widowed    Spouse name: Not on file  . Number of children: Not on file  . Years of education: Not on file  . Highest education level: Not on file  Social Needs  . Financial resource strain: Not on file  . Food insecurity - worry: Not on file  . Food insecurity - inability: Not on file  . Transportation needs - medical: Not on file  . Transportation needs - non-medical: Not on file  Occupational History  . Not on file  Tobacco Use  . Smoking status: Never Smoker  . Smokeless tobacco: Never Used  Substance and Sexual Activity  . Alcohol use: No  . Drug use: No  . Sexual activity: Not Currently  Other Topics Concern  . Not on file  Social History Narrative  . Not on file   History reviewed. No pertinent family history.    VITAL SIGNS BP 122/78   Pulse 89   Ht 5\' 3"  (1.6 m)   Wt 159 lb 3.2 oz (72.2 kg)   BMI 28.20 kg/m   Outpatient Encounter Medications as of  02/04/2017  Medication Sig  . Acetaminophen 500 MG coapsule Take 500 mg by mouth 3 (three) times daily as needed.   . Acetaminophen 500 MG coapsule Take 500 mg by mouth every 6 (six) hours as needed for fever (headache and minor discomfort).  Marland Kitchen. amLODipine (NORVASC) 10 MG tablet Take 10 mg by mouth daily.  . insulin aspart (NOVOLOG) 100 UNIT/ML injection Inject as per sliding scale subcutaneously before meals: 0 - 149 = 0 units 150 - 450 = 5 units Notify MD if less than 70 or greater than 450  . insulin detemir (LEVEMIR) 100 UNIT/ML injection Inject 10 Units into the skin at bedtime.   Marland Kitchen. levothyroxine (SYNTHROID, LEVOTHROID) 137 MCG tablet Take 137 mcg by mouth daily.   Marland Kitchen. loperamide (IMODIUM) 2 MG capsule Take 2 mg by mouth every 8 (eight) hours as needed for diarrhea or loose stools.   Marland Kitchen. LORazepam (ATIVAN) 0.5 MG tablet Take 1 tablet (0.5 mg total) by mouth daily.  Marland Kitchen. LORazepam (ATIVAN) 1 MG tablet Take 1 tablet (1 mg total) by mouth at bedtime.  . magnesium hydroxide (MILK OF MAGNESIA) 400 MG/5ML suspension Take 30 mLs by mouth at bedtime as needed for mild constipation.  . memantine (NAMENDA)  10 MG tablet Take 10 mg by mouth 2 (two) times daily.  . Multiple Vitamins-Minerals (DECUBI-VITE) CAPS Take 1 capsule by mouth daily.  . Nutritional Supplements (NUTRITIONAL SUPPLEMENT PO) House Supplement - Give 120 ml by mouth daily for supplement  . Nutritional Supplements (NUTRITIONAL SUPPLEMENT PO) HSG Regular Diet - HSG Puree texture, regular consistency  . omeprazole (PRILOSEC) 20 MG capsule Take 20 mg by mouth daily.  . ondansetron (ZOFRAN-ODT) 8 MG disintegrating tablet Take 8 mg by mouth every 8 (eight) hours as needed for nausea or vomiting.  . Probiotic CAPS Take 1 capsule by mouth 2 (two) times daily.  . QUEtiapine (SEROQUEL) 50 MG tablet Take 25-50 mg by mouth 2 (two) times daily. Take 25 mg in the morning and 50 mg at bedtime  . sertraline (ZOLOFT) 100 MG tablet Take 100 mg by mouth daily.   . vitamin B-12 (CYANOCOBALAMIN) 1000 MCG tablet Take 1,000 mcg by mouth daily.  Carlena Hurl. XARELTO 20 MG TABS tablet Take 20 mg by mouth daily.   No facility-administered encounter medications on file as of 02/04/2017.      SIGNIFICANT DIAGNOSTIC EXAMS  NO NEW EXAMS   LABS REVIEWED: PREVIOUS    03-01-16: wbc 5.3; hgb 13.1; hct 40.4; mcv 88.8; plt 228; glucose 211; bun 29; creat 1.84; k+ 4.3; na++ 138; tsh 30.991  04-15-16: wbc 4.9; hgb 12.7; hct 40.1; mcv 92.3; plt 200; glucose 169; bun 23.6; creat 1.21; k+ 4.4; na++ 141; liver normal albumin 3.8; hgb a1c 8.2 04-16-16: glucose 171; bun 19.3; creat 1.35; k+ 4.8; na++ 140; liver normal albumin 3.6; chol 190; ldl 112; trig 109; hdl 56 tsh 15.82; free T4: 0.63; hgb a1c 8.0  05-15-16: tsh 71.63 free T 4: 0.33 06-18-16: tsh 4.63; free T 4: 1.20 free T 3: 1.7 08-20-16: glucose 116; bun 41.9; creat 1.50; k+ 4.4; na++ 145; ca 9.1 tsh 2.06 ast 11; hgb a1c 7.3; chol 142; ldl 63; trig 83; hdl 62; urine micro-albumin 1.6  10-21-16: wbc 6.1; hgb 10.1; hct 31.8; mcv 89.3; plt 296; glucose 245; bun 48; creat 1.5; k+ 5.0 na++ 150; ca 9.4 10-22-16; wbc 5.8; hgb 9.7; hct 30.8; mcv 89.8; plt 280; glucose 205; bun 36; creat 1.32; k+ 4.6; na++ 149; liver normal albumin 3.1 11-29-16: Wbc 6.3; hgb 9.5; hct 30.0; mcv 94.7; plt 287; glucose 72; bun 39.9; creat 1.56; k+ 4.9; na++ 142; ca 8.8; liver normal albumin 3.4  NO NEW LABS    Review of Systems  Unable to perform ROS: Dementia (unable to answer questions )   Physical Exam  Constitutional: No distress.  Neck: Neck supple. No thyromegaly present.  Cardiovascular: Normal rate, regular rhythm and intact distal pulses.  Murmur heard. 1/6  Pulmonary/Chest: Effort normal. No respiratory distress.  Abdominal: Soft. Bowel sounds are normal. She exhibits no distension. There is no tenderness.  Musculoskeletal: She exhibits no edema.  Able to move all extremities   Lymphadenopathy:    She has no cervical adenopathy.    Neurological: She is alert.  Skin: Skin is warm and dry. She is not diaphoretic.  Psychiatric: She has a normal mood and affect.    ASSESSMENT/ PLAN:  TODAY:   1.  Diabetes type II with stage 4 ckd:  Is worse  hgb a1c 7.3( previous  8.0) ; will increase levemir to 15 units nightly and will increase novolog to 8 units after meals.    not on ace/arb due to poor renal function her tradjenta has been stopped.  Will check hgb a1c and lipids   MD is aware of resident's narcotic use and is in agreement with current plan of care. We will attempt to wean resident as apropriate     Synthia Innocent NP [facility]  Contact [phone] Monday through Friday 8am- 5pm  After hours call [phone]

## 2017-02-05 LAB — LIPID PANEL
CHOLESTEROL: 165 (ref 0–200)
HDL: 39 (ref 35–70)
LDL Cholesterol: 91
TRIGLYCERIDES: 171 — AB (ref 40–160)

## 2017-02-05 LAB — HEMOGLOBIN A1C: HEMOGLOBIN A1C: 11.7

## 2017-02-07 ENCOUNTER — Non-Acute Institutional Stay (SKILLED_NURSING_FACILITY): Payer: Medicare Other | Admitting: Adult Health

## 2017-02-07 ENCOUNTER — Emergency Department (HOSPITAL_COMMUNITY): Payer: Medicare Other

## 2017-02-07 ENCOUNTER — Encounter: Payer: Self-pay | Admitting: Adult Health

## 2017-02-07 ENCOUNTER — Encounter (HOSPITAL_COMMUNITY): Payer: Self-pay | Admitting: Emergency Medicine

## 2017-02-07 ENCOUNTER — Inpatient Hospital Stay (HOSPITAL_COMMUNITY)
Admission: EM | Admit: 2017-02-07 | Discharge: 2017-02-10 | DRG: 690 | Disposition: A | Discharge status: Skilled Nursing Facility | Payer: Medicare Other | Attending: Family Medicine | Admitting: Family Medicine

## 2017-02-07 ENCOUNTER — Other Ambulatory Visit: Payer: Self-pay

## 2017-02-07 DIAGNOSIS — E1122 Type 2 diabetes mellitus with diabetic chronic kidney disease: Secondary | ICD-10-CM | POA: Diagnosis present

## 2017-02-07 DIAGNOSIS — Z66 Do not resuscitate: Secondary | ICD-10-CM | POA: Diagnosis present

## 2017-02-07 DIAGNOSIS — Z7401 Bed confinement status: Secondary | ICD-10-CM | POA: Diagnosis not present

## 2017-02-07 DIAGNOSIS — E11621 Type 2 diabetes mellitus with foot ulcer: Secondary | ICD-10-CM | POA: Diagnosis present

## 2017-02-07 DIAGNOSIS — N179 Acute kidney failure, unspecified: Secondary | ICD-10-CM | POA: Diagnosis present

## 2017-02-07 DIAGNOSIS — I4581 Long QT syndrome: Secondary | ICD-10-CM | POA: Diagnosis present

## 2017-02-07 DIAGNOSIS — N184 Chronic kidney disease, stage 4 (severe): Secondary | ICD-10-CM | POA: Diagnosis present

## 2017-02-07 DIAGNOSIS — L97919 Non-pressure chronic ulcer of unspecified part of right lower leg with unspecified severity: Secondary | ICD-10-CM | POA: Diagnosis present

## 2017-02-07 DIAGNOSIS — Z794 Long term (current) use of insulin: Secondary | ICD-10-CM | POA: Diagnosis not present

## 2017-02-07 DIAGNOSIS — F0390 Unspecified dementia without behavioral disturbance: Secondary | ICD-10-CM | POA: Diagnosis present

## 2017-02-07 DIAGNOSIS — L97401 Non-pressure chronic ulcer of unspecified heel and midfoot limited to breakdown of skin: Secondary | ICD-10-CM | POA: Diagnosis not present

## 2017-02-07 DIAGNOSIS — Z86718 Personal history of other venous thrombosis and embolism: Secondary | ICD-10-CM | POA: Diagnosis not present

## 2017-02-07 DIAGNOSIS — Z79890 Hormone replacement therapy (postmenopausal): Secondary | ICD-10-CM

## 2017-02-07 DIAGNOSIS — Z7901 Long term (current) use of anticoagulants: Secondary | ICD-10-CM

## 2017-02-07 DIAGNOSIS — Z90710 Acquired absence of both cervix and uterus: Secondary | ICD-10-CM

## 2017-02-07 DIAGNOSIS — R4182 Altered mental status, unspecified: Secondary | ICD-10-CM | POA: Insufficient documentation

## 2017-02-07 DIAGNOSIS — N390 Urinary tract infection, site not specified: Secondary | ICD-10-CM | POA: Diagnosis not present

## 2017-02-07 DIAGNOSIS — I129 Hypertensive chronic kidney disease with stage 1 through stage 4 chronic kidney disease, or unspecified chronic kidney disease: Secondary | ICD-10-CM | POA: Diagnosis present

## 2017-02-07 DIAGNOSIS — L97509 Non-pressure chronic ulcer of other part of unspecified foot with unspecified severity: Secondary | ICD-10-CM

## 2017-02-07 DIAGNOSIS — L89899 Pressure ulcer of other site, unspecified stage: Secondary | ICD-10-CM

## 2017-02-07 DIAGNOSIS — L97929 Non-pressure chronic ulcer of unspecified part of left lower leg with unspecified severity: Secondary | ICD-10-CM | POA: Diagnosis present

## 2017-02-07 DIAGNOSIS — R739 Hyperglycemia, unspecified: Secondary | ICD-10-CM

## 2017-02-07 DIAGNOSIS — E1165 Type 2 diabetes mellitus with hyperglycemia: Secondary | ICD-10-CM | POA: Diagnosis present

## 2017-02-07 DIAGNOSIS — E870 Hyperosmolality and hypernatremia: Secondary | ICD-10-CM | POA: Diagnosis present

## 2017-02-07 DIAGNOSIS — E11622 Type 2 diabetes mellitus with other skin ulcer: Secondary | ICD-10-CM | POA: Diagnosis present

## 2017-02-07 DIAGNOSIS — E039 Hypothyroidism, unspecified: Secondary | ICD-10-CM | POA: Diagnosis present

## 2017-02-07 DIAGNOSIS — R404 Transient alteration of awareness: Secondary | ICD-10-CM

## 2017-02-07 DIAGNOSIS — B9620 Unspecified Escherichia coli [E. coli] as the cause of diseases classified elsewhere: Secondary | ICD-10-CM | POA: Diagnosis present

## 2017-02-07 DIAGNOSIS — L97529 Non-pressure chronic ulcer of other part of left foot with unspecified severity: Secondary | ICD-10-CM | POA: Diagnosis present

## 2017-02-07 DIAGNOSIS — L8990 Pressure ulcer of unspecified site, unspecified stage: Secondary | ICD-10-CM

## 2017-02-07 LAB — CBC WITH DIFFERENTIAL/PLATELET
BASOS ABS: 0 10*3/uL (ref 0.0–0.1)
BASOS PCT: 0 %
EOS ABS: 0.2 10*3/uL (ref 0.0–0.7)
EOS PCT: 2 %
HCT: 38.4 % (ref 36.0–46.0)
HEMOGLOBIN: 11.8 g/dL — AB (ref 12.0–15.0)
Lymphocytes Relative: 16 %
Lymphs Abs: 1.9 10*3/uL (ref 0.7–4.0)
MCH: 28.3 pg (ref 26.0–34.0)
MCHC: 30.7 g/dL (ref 30.0–36.0)
MCV: 92.1 fL (ref 78.0–100.0)
Monocytes Absolute: 0.5 10*3/uL (ref 0.1–1.0)
Monocytes Relative: 4 %
NEUTROS PCT: 78 %
Neutro Abs: 9.3 10*3/uL — ABNORMAL HIGH (ref 1.7–7.7)
PLATELETS: 293 10*3/uL (ref 150–400)
RBC: 4.17 MIL/uL (ref 3.87–5.11)
RDW: 16.6 % — ABNORMAL HIGH (ref 11.5–15.5)
WBC: 11.8 10*3/uL — AB (ref 4.0–10.5)

## 2017-02-07 LAB — URINALYSIS, ROUTINE W REFLEX MICROSCOPIC
BILIRUBIN URINE: NEGATIVE
Glucose, UA: 50 mg/dL — AB
Ketones, ur: NEGATIVE mg/dL
NITRITE: POSITIVE — AB
PROTEIN: NEGATIVE mg/dL
SPECIFIC GRAVITY, URINE: 1.021 (ref 1.005–1.030)
pH: 5 (ref 5.0–8.0)

## 2017-02-07 LAB — GLUCOSE, CAPILLARY: Glucose-Capillary: 306 mg/dL — ABNORMAL HIGH (ref 65–99)

## 2017-02-07 LAB — BLOOD GAS, VENOUS
Acid-base deficit: 0.7 mmol/L (ref 0.0–2.0)
Bicarbonate: 23.1 mmol/L (ref 20.0–28.0)
O2 SAT: 53.7 %
PATIENT TEMPERATURE: 98.6
PH VEN: 7.41 (ref 7.250–7.430)
pCO2, Ven: 37.2 mmHg — ABNORMAL LOW (ref 44.0–60.0)

## 2017-02-07 LAB — COMPREHENSIVE METABOLIC PANEL
ALBUMIN: 2.6 g/dL — AB (ref 3.5–5.0)
ALK PHOS: 77 U/L (ref 38–126)
ALT: 16 U/L (ref 14–54)
AST: 29 U/L (ref 15–41)
Anion gap: 8 (ref 5–15)
BILIRUBIN TOTAL: 1.2 mg/dL (ref 0.3–1.2)
BUN: 45 mg/dL — AB (ref 6–20)
CO2: 23 mmol/L (ref 22–32)
CREATININE: 1.57 mg/dL — AB (ref 0.44–1.00)
Calcium: 9 mg/dL (ref 8.9–10.3)
Chloride: 125 mmol/L — ABNORMAL HIGH (ref 101–111)
GFR calc Af Amer: 33 mL/min — ABNORMAL LOW (ref >60)
GFR, EST NON AFRICAN AMERICAN: 29 mL/min — AB (ref >60)
GLUCOSE: 335 mg/dL — AB (ref 65–99)
POTASSIUM: 4.9 mmol/L (ref 3.5–5.1)
Sodium: 156 mmol/L — ABNORMAL HIGH (ref 135–145)
TOTAL PROTEIN: 6.6 g/dL (ref 6.5–8.1)

## 2017-02-07 LAB — I-STAT TROPONIN, ED: Troponin i, poc: 0.08 ng/mL (ref 0.00–0.08)

## 2017-02-07 LAB — MRSA PCR SCREENING: MRSA by PCR: NEGATIVE

## 2017-02-07 LAB — CBG MONITORING, ED
GLUCOSE-CAPILLARY: 254 mg/dL — AB (ref 65–99)
Glucose-Capillary: 290 mg/dL — ABNORMAL HIGH (ref 65–99)

## 2017-02-07 LAB — I-STAT CG4 LACTIC ACID, ED
LACTIC ACID, VENOUS: 2.51 mmol/L — AB (ref 0.5–1.9)
LACTIC ACID, VENOUS: 1.78 mmol/L (ref 0.5–1.9)

## 2017-02-07 LAB — MAGNESIUM: Magnesium: 2.1 mg/dL (ref 1.7–2.4)

## 2017-02-07 LAB — HEMOGLOBIN A1C
Hgb A1c MFr Bld: 11.4 % — ABNORMAL HIGH (ref 4.8–5.6)
Mean Plasma Glucose: 280.48 mg/dL

## 2017-02-07 MED ORDER — PANTOPRAZOLE SODIUM 40 MG PO TBEC
40.0000 mg | DELAYED_RELEASE_TABLET | Freq: Every day | ORAL | Status: DC
  Administered 2017-02-08 – 2017-02-10 (×3): 40 mg via ORAL
  Filled 2017-02-07 (×3): qty 1

## 2017-02-07 MED ORDER — VITAMIN B-12 1000 MCG PO TABS
1000.0000 ug | ORAL_TABLET | Freq: Every day | ORAL | Status: DC
  Administered 2017-02-08 – 2017-02-10 (×3): 1000 ug via ORAL
  Filled 2017-02-07 (×3): qty 1

## 2017-02-07 MED ORDER — RIVAROXABAN 20 MG PO TABS: 20.0000 mg | ORAL_TABLET | Freq: Every day | ORAL | Status: DC

## 2017-02-07 MED ORDER — LORAZEPAM 0.5 MG PO TABS
0.5000 mg | ORAL_TABLET | Freq: Every day | ORAL | Status: DC
  Administered 2017-02-08 – 2017-02-10 (×3): 0.5 mg via ORAL
  Filled 2017-02-07 (×3): qty 1

## 2017-02-07 MED ORDER — INSULIN ASPART 100 UNIT/ML ~~LOC~~ SOLN
0.0000 [IU] | Freq: Three times a day (TID) | SUBCUTANEOUS | Status: DC
  Administered 2017-02-08 (×2): 7 [IU] via SUBCUTANEOUS

## 2017-02-07 MED ORDER — MEMANTINE HCL 10 MG PO TABS
10.0000 mg | ORAL_TABLET | Freq: Two times a day (BID) | ORAL | Status: DC
  Administered 2017-02-07 – 2017-02-10 (×6): 10 mg via ORAL
  Filled 2017-02-07 (×6): qty 1

## 2017-02-07 MED ORDER — DEXTROSE 5 % IV SOLN
1.0000 g | Freq: Once | INTRAVENOUS | Status: AC
  Administered 2017-02-07: 1 g via INTRAVENOUS
  Filled 2017-02-07: qty 10

## 2017-02-07 MED ORDER — ONDANSETRON HCL 4 MG/2ML IJ SOLN: 4.0000 mg | Freq: Four times a day (QID) | INTRAMUSCULAR | Status: DC | PRN

## 2017-02-07 MED ORDER — DEXTROSE 5 % IV SOLN
1.0000 g | INTRAVENOUS | Status: DC
  Administered 2017-02-08 – 2017-02-10 (×3): 1 g via INTRAVENOUS
  Filled 2017-02-07 (×3): qty 10

## 2017-02-07 MED ORDER — INSULIN DETEMIR 100 UNIT/ML ~~LOC~~ SOLN
15.0000 [IU] | Freq: Every day | SUBCUTANEOUS | Status: DC
  Administered 2017-02-07: 15 [IU] via SUBCUTANEOUS
  Filled 2017-02-07 (×2): qty 0.15

## 2017-02-07 MED ORDER — LEVOTHYROXINE SODIUM 25 MCG PO TABS
137.0000 ug | ORAL_TABLET | Freq: Every day | ORAL | Status: DC
  Administered 2017-02-08 – 2017-02-10 (×3): 137 ug via ORAL
  Filled 2017-02-07 (×3): qty 1

## 2017-02-07 MED ORDER — SERTRALINE HCL 100 MG PO TABS
100.0000 mg | ORAL_TABLET | Freq: Every day | ORAL | Status: DC
  Administered 2017-02-08 – 2017-02-10 (×3): 100 mg via ORAL
  Filled 2017-02-07 (×3): qty 1

## 2017-02-07 MED ORDER — AMLODIPINE BESYLATE 10 MG PO TABS
10.0000 mg | ORAL_TABLET | Freq: Every day | ORAL | Status: DC
  Administered 2017-02-08 – 2017-02-10 (×3): 10 mg via ORAL
  Filled 2017-02-07 (×3): qty 1

## 2017-02-07 MED ORDER — ONDANSETRON HCL 4 MG PO TABS: 4.0000 mg | ORAL_TABLET | Freq: Four times a day (QID) | ORAL | Status: DC | PRN

## 2017-02-07 MED ORDER — QUETIAPINE FUMARATE 25 MG PO TABS
25.0000 mg | ORAL_TABLET | Freq: Every day | ORAL | Status: DC
  Administered 2017-02-08 – 2017-02-10 (×3): 25 mg via ORAL
  Filled 2017-02-07 (×3): qty 1

## 2017-02-07 MED ORDER — DEXTROSE 5 % IV SOLN
INTRAVENOUS | Status: DC
  Administered 2017-02-07 – 2017-02-10 (×6): via INTRAVENOUS

## 2017-02-07 MED ORDER — SODIUM CHLORIDE 0.9 % IV BOLUS (SEPSIS)
1000.0000 mL | Freq: Once | INTRAVENOUS | Status: AC
  Administered 2017-02-07: 1000 mL via INTRAVENOUS

## 2017-02-07 MED ORDER — QUETIAPINE FUMARATE 25 MG PO TABS
50.0000 mg | ORAL_TABLET | Freq: Every day | ORAL | Status: DC
  Administered 2017-02-07 – 2017-02-09 (×3): 50 mg via ORAL
  Filled 2017-02-07 (×3): qty 2

## 2017-02-07 MED ORDER — ACETAMINOPHEN 500 MG PO TABS
500.0000 mg | ORAL_TABLET | Freq: Three times a day (TID) | ORAL | Status: DC
  Administered 2017-02-07 – 2017-02-10 (×8): 500 mg via ORAL
  Filled 2017-02-07 (×8): qty 1

## 2017-02-07 NOTE — Progress Notes (Signed)
Pharmacy Antibiotic Note  Cynthia [name] is a 83 y.o. female admitted on 02/07/2017 with UTI.  Pharmacy has been consulted for Ceftriaxone dosing.  Lactic acid: 2.5 > 1.78 WBC 11.8  Plan: Ceftriaxone 1g IV q24h  Height: 5\' 7"  (170.2 cm) Weight: 155 lb 3.3 oz (70.4 kg) IBW/kg (Calculated) : 61.6  Temp (24hrs), Avg:98.1 F (36.7 C), Min:97.1 F (36.2 C), Max:99.2 F (37.3 C)  Recent Labs  Lab 02/07/17 1203 02/07/17 1252 02/07/17 1440  WBC 11.8*  --   --   CREATININE 1.57*  --   --   LATICACIDVEN  --  2.51* 1.78    Estimated Creatinine Clearance: 24.5 mL/min (A) (by C-G formula based on SCr of 1.57 mg/dL (H)).    No Known Allergies   Thank you for allowing pharmacy to be a part of this patient's care.  Jodelle GrossShade, Johnette Teigen Elizabeth 02/07/2017 7:23 PM

## 2017-02-07 NOTE — ED Provider Notes (Signed)
[facility]-EMERGENCY DEPT Provider Note   CSN: [id number] Arrival date & time: 02/07/17  1125     History   Chief Complaint Chief Complaint  Patient presents with  . Hyperglycemia    HPI Cynthia [name] is a 83 y.o. female.  83yo F w/ PMH including IDDM, dementia, CKD, HTN who p/w hyperglycemia.  Daughter states the patient has had 2 days of persistent high blood sugars up to 500s although she has been getting her medications at her nursing facility.  She was given 10 units of NovoLog this morning prior to transfer to the ED.  Nurses at her facility noted some foot wounds this morning that were bandaged.  Daughter states that she has had some confusion from her baseline today. No vomiting or cough/URI symptoms. No known fevers.  LEVEL 5 CAVEAT DUE TO DEMENTIA AND AMS   The history is provided by a relative.  Hyperglycemia    Past Medical History:  Diagnosis Date  . CKD (chronic kidney disease)   . Dementia   . Diabetes mellitus without complication (HCC)   . Hypertension   . Hypothyroid   . Hypothyroid   . Thyroid disease     Patient Active Problem List   Diagnosis Date Noted  . Altered mental status, unspecified 02/07/2017  . Diabetic foot ulcer associated with type 2 diabetes mellitus (HCC) 02/07/2017  . Dysphagia, oral phase 12/30/2016  . Clostridium difficile colitis 12/07/2016  . Weight loss 10/21/2016  . Hypernatremia 10/21/2016  . Depression with anxiety 05/21/2016  . Hypothyroidism due to acquired atrophy of thyroid 04/15/2016  . Cardiac arrhythmia 04/15/2016  . Dementia with behavioral disturbance 04/15/2016  . Psychosis (HCC) 04/15/2016  . Type II diabetes mellitus with stage 4 chronic kidney disease (HCC) 04/15/2016  . Hyperlipidemia associated with type 2 diabetes mellitus (HCC) 04/15/2016  . Hypertension associated with diabetes (HCC) 04/15/2016  . GERD without esophagitis 04/15/2016    Past Surgical History:    Procedure Laterality Date  . ABDOMINAL HYSTERECTOMY    . FRACTURE SURGERY    . INCISION AND DRAINAGE ABSCESS Left 10/22/2016   Procedure: INCISION AND DRAINAGE LEFT AXILLARY  ABSCESS;  Surgeon: Glenna FellowsHoxworth, Benjamin, MD;  Location: [facility];  Service: General;  Laterality: Left;    OB History    No data available       Home Medications    Prior to Admission medications   Medication Sig Start Date End Date Taking? Authorizing Provider  acetaminophen (TYLENOL) 500 MG tablet Take 500 mg by mouth 3 (three) times daily.   Yes [provider]  acetaminophen (TYLENOL) 500 MG tablet Take 500 mg by mouth every 6 (six) hours as needed for mild pain or headache.   Yes [provider]  amLODipine (NORVASC) 10 MG tablet Take 10 mg by mouth daily. 05/13/15  Yes [provider]  insulin aspart (NOVOLOG FLEXPEN) 100 UNIT/ML FlexPen Inject 10 Units into the skin once.   Yes [provider]  insulin aspart (NOVOLOG) 100 UNIT/ML injection Inject 0-8 Units into the skin 3 (three) times daily before meals. Inject as per sliding scale subcutaneously before meals: 0 - 149 = 0 units 150 - 450 = 8 units Notify MD if less than 70 or greater than 450 12/30/16  Yes [provider]  insulin detemir (LEVEMIR) 100 UNIT/ML injection Inject 15 Units into the skin at bedtime.  02/04/17  Yes [provider]  levothyroxine (SYNTHROID, LEVOTHROID) 137 MCG tablet Take 137 mcg by mouth  daily.    Yes [provider]  loperamide (IMODIUM A-D) 2 MG tablet Take 2 mg by mouth every 8 (eight) hours as needed for diarrhea or loose stools.   Yes [provider]  LORazepam (ATIVAN) 0.5 MG tablet Take 1 tablet (0.5 mg total) by mouth daily. 01/29/17  Yes Sharee HolsterGreen, Deborah S, NP  magnesium hydroxide (MILK OF MAGNESIA) 400 MG/5ML suspension Take 30 mLs by mouth daily as needed for mild constipation.    Yes [provider]  memantine (NAMENDA) 10 MG tablet Take 10 mg  by mouth 2 (two) times daily.   Yes [provider]  Multiple Vitamins-Minerals (DECUBI-VITE) CAPS Take 1 capsule by mouth daily. 12/29/16  Yes [provider]  Nutritional Supplements (NUTRITIONAL SUPPLEMENT PO) Take 120 mLs by mouth daily. *House Supplement*   Yes [provider]  omeprazole (PRILOSEC) 20 MG capsule Take 20 mg by mouth daily. 04/30/15  Yes [provider]  ondansetron (ZOFRAN-ODT) 8 MG disintegrating tablet Take 8 mg by mouth every 8 (eight) hours as needed for nausea or vomiting.   Yes [provider]  Probiotic Product (PROBIOTIC DAILY) CAPS Take 1 capsule by mouth 2 (two) times daily.   Yes [provider]  QUEtiapine (SEROQUEL) 50 MG tablet Take 25-50 mg by mouth 2 (two) times daily. Take 25 mg in the morning and 50 mg at bedtime   Yes [provider]  sertraline (ZOLOFT) 100 MG tablet Take 100 mg by mouth daily.   Yes [provider]  vitamin B-12 (CYANOCOBALAMIN) 1000 MCG tablet Take 1,000 mcg by mouth daily.   Yes [provider]  XARELTO 20 MG TABS tablet Take 20 mg by mouth daily. 04/26/15  Yes [provider]  LORazepam (ATIVAN) 1 MG tablet Take 1 tablet (1 mg total) by mouth at bedtime. Patient not taking: Reported on 02/07/2017 01/29/17   Sharee HolsterGreen, Deborah S, NP    Family History History reviewed. No pertinent family history.  Social History Social History   Tobacco Use  . Smoking status: Never Smoker  . Smokeless tobacco: Never Used  Substance Use Topics  . Alcohol use: No  . Drug use: No     Allergies   Patient has no known allergies.   Review of Systems Review of Systems  Unable to perform ROS: Dementia     Physical Exam Updated Vital Signs BP (!) 150/75   Pulse 74   Temp 98 F (36.7 C) (Oral)   Resp 16   SpO2 100%   Physical Exam  Constitutional: She appears well-developed and well-nourished. No distress.  HENT:  Head: Normocephalic and atraumatic.   dry mucous membranes  Eyes: Conjunctivae are normal. Pupils are equal, round, and reactive to light.  Neck: Neck supple.  Cardiovascular: Normal rate, regular rhythm and normal heart sounds.  Pulmonary/Chest: Effort normal and breath sounds normal.  Abdominal: Soft. Bowel sounds are normal. She exhibits no distension. There is no tenderness.  Musculoskeletal: She exhibits no edema.  Neurological:  Eyes closed but will open to voice, confused, protecting airway  Skin: Skin is warm and dry.  Hemorrhagic blisters on right medial foot, right medial base of first toe; pressure ulcers on left lateral malleolus, medial malleolus, and L shin  Nursing note and vitals reviewed.    ED Treatments / Results  Labs (all labs ordered are listed, but only abnormal results are displayed) Labs Reviewed  COMPREHENSIVE METABOLIC PANEL - Abnormal; Notable for the following components:  Result Value   Sodium 156 (*)    Chloride 125 (*)    Glucose, Bld 335 (*)    BUN 45 (*)    Creatinine, Ser 1.57 (*)    Albumin 2.6 (*)    GFR calc non Af Amer 29 (*)    GFR calc Af Amer 33 (*)    All other components within normal limits  CBC WITH DIFFERENTIAL/PLATELET - Abnormal; Notable for the following components:   WBC 11.8 (*)    Hemoglobin 11.8 (*)    RDW 16.6 (*)    Neutro Abs 9.3 (*)    All other components within normal limits  BLOOD GAS, VENOUS - Abnormal; Notable for the following components:   pCO2, Ven 37.2 (*)    All other components within normal limits  URINALYSIS, ROUTINE W REFLEX MICROSCOPIC - Abnormal; Notable for the following components:   APPearance CLOUDY (*)    Glucose, UA 50 (*)    Hgb urine dipstick SMALL (*)    Nitrite POSITIVE (*)    Leukocytes, UA LARGE (*)    Bacteria, UA MANY (*)    Squamous Epithelial / LPF 0-5 (*)    Non Squamous Epithelial 0-5 (*)    All other components within normal limits  CBG MONITORING, ED - Abnormal; Notable for the following components:    Glucose-Capillary 290 (*)    All other components within normal limits  I-STAT CG4 LACTIC ACID, ED - Abnormal; Notable for the following components:   Lactic Acid, Venous 2.51 (*)    All other components within normal limits  CBG MONITORING, ED - Abnormal; Notable for the following components:   Glucose-Capillary 254 (*)    All other components within normal limits  URINE CULTURE  I-STAT TROPONIN, ED  I-STAT CG4 LACTIC ACID, ED    EKG  EKG Interpretation  Date/Time:  Friday February 07 2017 12:24:35 EST Ventricular Rate:  81 PR Interval:    QRS Duration: 81 QT Interval:  494 QTC Calculation: 574 R Axis:   -10 Text Interpretation:  Sinus rhythm Left ventricular hypertrophy Nonspecific T abnrm, anterolateral leads Prolonged QT interval No significant change since last tracing Confirmed by Frederick Peers [phone]) on 02/07/2017 1:33:01 PM       Radiology Dg Chest 2 View  Result Date: 02/07/2017 CLINICAL DATA:  Lethargic and week. EXAM: CHEST  2 VIEW COMPARISON:  03/01/2016 FINDINGS: Knee heart is mildly enlarged but stable. There is moderate tortuosity of the thoracic aorta. The pulmonary hila appear normal. Low lung volumes with vascular crowding and streaky basilar atelectasis but no infiltrates, edema or effusions. The bony thorax is intact. Remote T12 compression fracture is noted. Advanced degenerative changes involving both shoulders and probable bilateral rotator cuff disease. IMPRESSION: No acute cardiopulmonary findings. Electronically Signed   By: Rudie Meyer M.D.   On: 02/07/2017 12:31    Procedures .Critical Care Performed by: Laurence Spates, MD Authorized by: Laurence Spates, MD   Critical care provider statement:    Critical care time (minutes):  35   Critical care time was exclusive of:  Separately billable procedures and treating other patients   Critical care was necessary to treat or prevent imminent or life-threatening deterioration of the  following conditions:  Metabolic crisis and CNS failure or compromise   Critical care was time spent personally by me on the following activities:  Development of treatment plan with patient or surrogate, evaluation of patient's response to treatment, examination of patient, obtaining history from patient  or surrogate, ordering and performing treatments and interventions, ordering and review of laboratory studies, ordering and review of radiographic studies and re-evaluation of patient's condition   (including critical care time)  Medications Ordered in ED Medications  sodium chloride 0.9 % bolus 1,000 mL (0 mLs Intravenous Stopped 02/07/17 1401)  cefTRIAXone (ROCEPHIN) 1 g in dextrose 5 % 50 mL IVPB (1 g Intravenous New Bag/Given 02/07/17 1534)     Initial Impression / Assessment and Plan / ED Course  I have reviewed the triage vital signs and the nursing notes.  Pertinent labs & imaging results that were available during my care of the patient were reviewed by me and considered in my medical decision making (see chart for details).    Pt w/ persistent hyperglycemia, confusion per daughter. Non-toxic on exam, VS reassuring, afebrile.  Pressure wounds as above.  Initial labs notable for lactate 2.5, reassuring VBG, sodium 156, chloride 125, glucose 335, creatinine 1.57 which is similar to baseline, normal anion gap, CBC 11.8, UA consistent with infection.  Urine culture added.  Gave the patient ceftriaxone and an IV fluid bolus.  Because of her confusion, hyperglycemia, and hypernatremia in the setting of UTI discussed admission with hospitalist Dr. Sharl Ma, appreciate assistance. Pt admitted for further care.  Final Clinical Impressions(s) / ED Diagnoses   Final diagnoses:  Urinary tract infection without hematuria, site unspecified  Hyperglycemia  Hypernatremia  Altered mental status, unspecified altered mental status type  Pressure ulcer of both feet    ED Discharge Orders    None         Benjerman Molinelli, Ambrose Finland, MD 02/07/17 1615

## 2017-02-07 NOTE — H&P (Addendum)
TRH H&P    Patient Demographics:    Cynthia [name], is a 83 y.o. female  MRN: [id number]  DOB - 10/17/1927  Admit Date - 02/07/2017  Referring MD/NP/PA: Dr Clarene DukeLittle  Outpatient Primary MD for the patient is Kirt Boysarter, Monica, DO  Patient coming from: SNF  Chief Complaint  Patient presents with  . Hyperglycemia      HPI:    Cynthia [name]  is a 83 y.o. female, with history of dementia, diabetes mellitus, hypothyroidism, recurrent DVTs was sent to ED for hyperglycemia.  At the skilled facility patient's blood glucose was 400-550 range.  Patient has underlying dementia and is not very interactive at baseline.  She is bedbound.  But as per daughter she has not been eating and drinking for the past few days.  There is no report of fever or chills.  No chest pain or shortness of breath.  In the ED patient was found to have abnormal UA.  Started on IV ceftriaxone. Also found to have hyponatremia with sodium 156   Review of systems:     All other systems reviewed and are negative.   With Past History of the following :    Past Medical History:  Diagnosis Date  . CKD (chronic kidney disease)   . Dementia   . Diabetes mellitus without complication (HCC)   . Hypertension   . Hypothyroid   . Hypothyroid   . Thyroid disease       Past Surgical History:  Procedure Laterality Date  . ABDOMINAL HYSTERECTOMY    . FRACTURE SURGERY    . INCISION AND DRAINAGE ABSCESS Left 10/22/2016   Procedure: INCISION AND DRAINAGE LEFT AXILLARY  ABSCESS;  Surgeon: Glenna FellowsHoxworth, Benjamin, MD;  Location: [facility];  Service: General;  Laterality: Left;      Social History:      Social History   Tobacco Use  . Smoking status: Never Smoker  . Smokeless tobacco: Never Used  Substance Use Topics  . Alcohol use: No       Family History :   No family history of cancer or heart disease   Home Medications:    Prior to Admission medications   Medication Sig Start Date End Date Taking? Authorizing Provider  acetaminophen (TYLENOL) 500 MG tablet Take 500 mg by mouth 3 (three) times daily.   Yes [provider]  acetaminophen (TYLENOL) 500 MG tablet Take 500 mg by mouth every 6 (six) hours as needed for mild pain or headache.   Yes [provider]  amLODipine (NORVASC) 10 MG tablet Take 10 mg by mouth daily. 05/13/15  Yes [provider]  insulin aspart (NOVOLOG FLEXPEN) 100 UNIT/ML FlexPen Inject 10 Units into the skin once.   Yes [provider]  insulin aspart (NOVOLOG) 100 UNIT/ML injection Inject 0-8 Units into the skin 3 (three) times daily before meals. Inject as per sliding scale subcutaneously before meals: 0 - 149 = 0 units 150 - 450 = 8 units Notify MD if less than 70 or greater than 450 12/30/16  Yes  [provider]  insulin detemir (LEVEMIR) 100 UNIT/ML injection Inject 15 Units into the skin at bedtime.  02/04/17  Yes [provider]  levothyroxine (SYNTHROID, LEVOTHROID) 137 MCG tablet Take 137 mcg by mouth daily.    Yes [provider]  loperamide (IMODIUM A-D) 2 MG tablet Take 2 mg by mouth every 8 (eight) hours as needed for diarrhea or loose stools.   Yes [provider]  LORazepam (ATIVAN) 0.5 MG tablet Take 1 tablet (0.5 mg total) by mouth daily. 01/29/17  Yes Sharee Holster, NP  magnesium hydroxide (MILK OF MAGNESIA) 400 MG/5ML suspension Take 30 mLs by mouth daily as needed for mild constipation.    Yes [provider]  memantine (NAMENDA) 10 MG tablet Take 10 mg by mouth 2 (two) times daily.   Yes [provider]  Multiple Vitamins-Minerals (DECUBI-VITE) CAPS Take 1 capsule by mouth daily. 12/29/16  Yes [provider]  Nutritional Supplements (NUTRITIONAL SUPPLEMENT PO) Take 120 mLs by mouth daily. *House Supplement*   Yes [provider]  omeprazole (PRILOSEC) 20 MG  capsule Take 20 mg by mouth daily. 04/30/15  Yes [provider]  ondansetron (ZOFRAN-ODT) 8 MG disintegrating tablet Take 8 mg by mouth every 8 (eight) hours as needed for nausea or vomiting.   Yes [provider]  Probiotic Product (PROBIOTIC DAILY) CAPS Take 1 capsule by mouth 2 (two) times daily.   Yes [provider]  QUEtiapine (SEROQUEL) 50 MG tablet Take 25-50 mg by mouth 2 (two) times daily. Take 25 mg in the morning and 50 mg at bedtime   Yes [provider]  sertraline (ZOLOFT) 100 MG tablet Take 100 mg by mouth daily.   Yes [provider]  vitamin B-12 (CYANOCOBALAMIN) 1000 MCG tablet Take 1,000 mcg by mouth daily.   Yes [provider]  XARELTO 20 MG TABS tablet Take 20 mg by mouth daily. 04/26/15  Yes [provider]  LORazepam (ATIVAN) 1 MG tablet Take 1 tablet (1 mg total) by mouth at bedtime. Patient not taking: Reported on 02/07/2017 01/29/17   Sharee Holster, NP     Allergies:    No Known Allergies   Physical Exam:   Vitals  Blood pressure (!) 128/102, pulse 76, temperature 98 F (36.7 C), temperature source Oral, resp. rate 13, SpO2 100 %.  1.  General: Appears dehydrated  2. Psychiatric: Alert but not oriented x3  3. Neurologic: No focal neurological deficits, all cranial nerves intact.Strength 5/5 all 4 extremities, sensation intact all 4 extremities, plantars down going.  4. Eyes :  anicteric sclerae, moist conjunctivae with no lid lag. PERRLA.  5. ENMT:  Oropharynx clear with moist mucous membranes and good dentition  6. Neck:  supple, no cervical lymphadenopathy appriciated, No thyromegaly  7. Respiratory : Normal respiratory effort, good air movement bilaterally,clear to  auscultation bilaterally  8. Cardiovascular : RRR, no gallops, rubs or murmurs, no leg edema  9. Gastrointestinal:  Positive bowel sounds, abdomen soft, non-tender to palpation,no hepatosplenomegaly, no rigidity  or guarding       10. Skin:  Multiple skin ulcers noted in the both feet, mild edema and mild erythema.  Both feet positive tender to palpation  11.Musculoskeletal:  Good muscle tone,  joints appear normal , no effusions,  normal range of motion    Data Review:    CBC Recent Labs  Lab 02/07/17 1203  WBC 11.8*  HGB 11.8*  HCT 38.4  PLT 293  MCV 92.1  MCH 28.3  MCHC 30.7  RDW 16.6*  LYMPHSABS 1.9  MONOABS 0.5  EOSABS 0.2  BASOSABS 0.0   ------------------------------------------------------------------------------------------------------------------  Chemistries  Recent Labs  Lab 02/07/17 1203  NA 156*  K 4.9  CL 125*  CO2 23  GLUCOSE 335*  BUN 45*  CREATININE 1.57*  CALCIUM 9.0  AST 29  ALT 16  ALKPHOS 77  BILITOT 1.2   ------------------------------------------------------------------------------------------------------------------  ------------------------------------------------------------------------------------------------------------------ GFR: Estimated Creatinine Clearance: 24 mL/min (A) (by C-G formula based on SCr of 1.57 mg/dL (H)). Liver Function Tests: Recent Labs  Lab 02/07/17 1203  AST 29  ALT 16  ALKPHOS 77  BILITOT 1.2  PROT 6.6  ALBUMIN 2.6*   HbA1C: Recent Labs    02/05/17  HGBA1C 11.7   CBG: Recent Labs  Lab 02/07/17 1149 02/07/17 1523  GLUCAP 290* 254*   Lipid Profile: Recent Labs    02/05/17  CHOL 165  HDL 39  LDLCALC 91  TRIG 171*   Thyroid Function Tests: No results for input(s): TSH, T4TOTAL, FREET4, T3FREE, THYROIDAB in the last 72 hours. Anemia Panel: No results for input(s): VITAMINB12, FOLATE, FERRITIN, TIBC, IRON, RETICCTPCT in the last 72 hours.  --------------------------------------------------------------------------------------------------------------- Urine analysis:    Component Value Date/Time   COLORURINE YELLOW 02/07/2017 1158   APPEARANCEUR CLOUDY (A) 02/07/2017 1158   LABSPEC 1.021  02/07/2017 1158   PHURINE 5.0 02/07/2017 1158   GLUCOSEU 50 (A) 02/07/2017 1158   HGBUR SMALL (A) 02/07/2017 1158   BILIRUBINUR NEGATIVE 02/07/2017 1158   KETONESUR NEGATIVE 02/07/2017 1158   PROTEINUR NEGATIVE 02/07/2017 1158   NITRITE POSITIVE (A) 02/07/2017 1158   LEUKOCYTESUR LARGE (A) 02/07/2017 1158      Imaging Results:    Dg Chest 2 View  Result Date: 02/07/2017 CLINICAL DATA:  Lethargic and week. EXAM: CHEST  2 VIEW COMPARISON:  03/01/2016 FINDINGS: Knee heart is mildly enlarged but stable. There is moderate tortuosity of the thoracic aorta. The pulmonary hila appear normal. Low lung volumes with vascular crowding and streaky basilar atelectasis but no infiltrates, edema or effusions. The bony thorax is intact. Remote T12 compression fracture is noted. Advanced degenerative changes involving both shoulders and probable bilateral rotator cuff disease. IMPRESSION: No acute cardiopulmonary findings. Electronically Signed   By: Rudie MeyerP.  Gallerani M.D.   On: 02/07/2017 12:31    My personal review of EKG: Rhythm NSR, QTC 574   Assessment & Plan:    Active Problems:   UTI (urinary tract infection)   1. UTI-urine culture has been obtained in the ED.  Continue IV ceftriaxone.  Follow urine culture results. 2. Hyperglycemia/diabetes mellitus-continue Lantus 15 units subcu daily.  Will initiate sliding scale insulin with NovoLog. 3. Hypernatremia-sodium 156, chloride 125.  Will start D5W at 75 mL/h.  Patient blood glucose has improved, will closely monitor patient blood glucose as she is on D5W.  Check CBG every 6 hours.   4. Hypothyroidism-continue Synthroid 5. Hypertension-continue amlodipine 6. Recurrent DVTs-continue Xarelto 7. Dementia, no behavioral disturbance-continue Namenda, Seroquel 8. Leg ulcers-we will consult wound care. 9. Prolonged QTC-check serum magnesium, monitor on telemetry.   DVT Prophylaxis-  Xarelto  AM Labs Ordered, also please review Full Orders  Family  Communication: Admission, patients condition and plan of care including tests being ordered have been discussed with the patient's daughter at bedside who indicate understanding and agree with the plan and Code Status.  Code Status:  DNR  Admission status: Inpatient   Time spent in minutes : 60 min  Meredeth Ide M.D on 02/07/2017 at 4:09 PM  Between 7am to 7pm - Pager - [phone]. After 7pm go to [email or web address] - password [facility]  [facility] - Office  [phone]

## 2017-02-07 NOTE — ED Notes (Addendum)
Report re-given to different nurse because bed was changed after patient left the ED. See documentation at 1827

## 2017-02-07 NOTE — Progress Notes (Signed)
Location:   [facility] Room Number: 202 B Place of Service:  SNF (31)   CODE STATUS: DNR  No Known Allergies  Chief Complaint  Patient presents with  . Acute Visit    Change in status / Elevated CBG's    HPI:  Staff is concerned about her level or awareness. She is less responsive;her cbgs are now in the 400-550 range. She has breakdown with burst blisters and intact blisters which appear to be inflamed especially on the right foot. There is increased swelling on the right foot. There are no reports of fevers present; her po intake last night and this AM is extremely poor. She is unable to participate in the hpi or ros.    Past Medical History:  Diagnosis Date  . CKD (chronic kidney disease)   . Dementia   . Diabetes mellitus without complication (HCC)   . Hypertension   . Hypothyroid   . Hypothyroid   . Thyroid disease     Past Surgical History:  Procedure Laterality Date  . ABDOMINAL HYSTERECTOMY    . FRACTURE SURGERY    . INCISION AND DRAINAGE ABSCESS Left 10/22/2016   Procedure: INCISION AND DRAINAGE LEFT AXILLARY  ABSCESS;  Surgeon: Glenna FellowsHoxworth, Benjamin, MD;  Location: [facility];  Service: General;  Laterality: Left;    Social History   Socioeconomic History  . Marital status: Widowed    Spouse name: Not on file  . Number of children: Not on file  . Years of education: Not on file  . Highest education level: Not on file  Social Needs  . Financial resource strain: Not on file  . Food insecurity - worry: Not on file  . Food insecurity - inability: Not on file  . Transportation needs - medical: Not on file  . Transportation needs - non-medical: Not on file  Occupational History  . Not on file  Tobacco Use  . Smoking status: Never Smoker  . Smokeless tobacco: Never Used  Substance and Sexual Activity  . Alcohol use: No  . Drug use: No  . Sexual activity: Not Currently  Other Topics Concern  . Not on file  Social History Narrative  . Not on  file   History reviewed. No pertinent family history.    VITAL SIGNS Ht 5\' 3"  (1.6 m)   Wt 159 lb 3.2 oz (72.2 kg)   SpO2 95%   BMI 28.20 kg/m   Outpatient Encounter Medications as of 02/07/2017  Medication Sig  . Acetaminophen 500 MG coapsule Take 500 mg by mouth 3 (three) times daily as needed.   . Acetaminophen 500 MG coapsule Take 500 mg by mouth every 6 (six) hours as needed for fever (headache and minor discomfort).  Marland Kitchen. amLODipine (NORVASC) 10 MG tablet Take 10 mg by mouth daily.  . insulin aspart (NOVOLOG) 100 UNIT/ML injection Inject as per sliding scale subcutaneously before meals: 0 - 149 = 0 units 150 - 450 = 8 units Notify MD if less than 70 or greater than 450  . levothyroxine (SYNTHROID, LEVOTHROID) 137 MCG tablet Take 137 mcg by mouth daily.   Marland Kitchen. loperamide (IMODIUM) 2 MG capsule Take 2 mg by mouth every 8 (eight) hours as needed for diarrhea or loose stools.   Marland Kitchen. LORazepam (ATIVAN) 0.5 MG tablet Take 1 tablet (0.5 mg total) by mouth daily.  Marland Kitchen. LORazepam (ATIVAN) 1 MG tablet Take 1 tablet (1 mg total) by mouth at bedtime.  . magnesium hydroxide (MILK OF MAGNESIA) 400 MG/5ML  suspension Take 30 mLs by mouth at bedtime as needed for mild constipation.  . memantine (NAMENDA) 10 MG tablet Take 10 mg by mouth 2 (two) times daily.  . Multiple Vitamins-Minerals (DECUBI-VITE) CAPS Take 1 capsule by mouth daily.  . Nutritional Supplements (NUTRITIONAL SUPPLEMENT PO) House Supplement - Give 120 ml by mouth daily for supplement  . Nutritional Supplements (NUTRITIONAL SUPPLEMENT PO) HSG Regular Diet - HSG Puree texture, regular consistency  . omeprazole (PRILOSEC) 20 MG capsule Take 20 mg by mouth daily.  . ondansetron (ZOFRAN-ODT) 8 MG disintegrating tablet Take 8 mg by mouth every 8 (eight) hours as needed for nausea or vomiting.  . Probiotic CAPS Take 1 capsule by mouth 2 (two) times daily.  . QUEtiapine (SEROQUEL) 50 MG tablet Take 25-50 mg by mouth 2 (two) times daily. Take 25 mg  in the morning and 50 mg at bedtime  . sertraline (ZOLOFT) 100 MG tablet Take 100 mg by mouth daily.  . vitamin B-12 (CYANOCOBALAMIN) 1000 MCG tablet Take 1,000 mcg by mouth daily.  Carlena Hurl. XARELTO 20 MG TABS tablet Take 20 mg by mouth daily.  . insulin detemir (LEVEMIR) 100 UNIT/ML injection Inject 15 Units into the skin at bedtime.    No facility-administered encounter medications on file as of 02/07/2017.      SIGNIFICANT DIAGNOSTIC EXAMS  NO NEW EXAMS   LABS REVIEWED: PREVIOUS    03-01-16: wbc 5.3; hgb 13.1; hct 40.4; mcv 88.8; plt 228; glucose 211; bun 29; creat 1.84; k+ 4.3; na++ 138; tsh 30.991  04-15-16: wbc 4.9; hgb 12.7; hct 40.1; mcv 92.3; plt 200; glucose 169; bun 23.6; creat 1.21; k+ 4.4; na++ 141; liver normal albumin 3.8; hgb a1c 8.2 04-16-16: glucose 171; bun 19.3; creat 1.35; k+ 4.8; na++ 140; liver normal albumin 3.6; chol 190; ldl 112; trig 109; hdl 56 tsh 15.82; free T4: 0.63; hgb a1c 8.0  05-15-16: tsh 71.63 free T 4: 0.33 06-18-16: tsh 4.63; free T 4: 1.20 free T 3: 1.7 08-20-16: glucose 116; bun 41.9; creat 1.50; k+ 4.4; na++ 145; ca 9.1 tsh 2.06 ast 11; hgb a1c 7.3; chol 142; ldl 63; trig 83; hdl 62; urine micro-albumin 1.6  10-21-16: wbc 6.1; hgb 10.1; hct 31.8; mcv 89.3; plt 296; glucose 245; bun 48; creat 1.5; k+ 5.0 na++ 150; ca 9.4 10-22-16; wbc 5.8; hgb 9.7; hct 30.8; mcv 89.8; plt 280; glucose 205; bun 36; creat 1.32; k+ 4.6; na++ 149; liver normal albumin 3.1 11-29-16: Wbc 6.3; hgb 9.5; hct 30.0; mcv 94.7; plt 287; glucose 72; bun 39.9; creat 1.56; k+ 4.9; na++ 142; ca 8.8; liver normal albumin 3.4  NO NEW LABS    Review of Systems  Unable to perform ROS: Dementia (is nonverbal )    Physical Exam  Constitutional: No distress.  Frail   Neck: Neck supple. No thyromegaly present.  Cardiovascular: Normal rate, regular rhythm and intact distal pulses.  Murmur heard. 1/6  Pulmonary/Chest: Effort normal. She has wheezes.  Fine scattered wheezes   Abdominal: Soft.  Bowel sounds are normal. She exhibits no distension. There is no tenderness.  Musculoskeletal:  Left hemiplegia (chronic)  Lymphadenopathy:    She has no cervical adenopathy.  Neurological:  Is aware  Skin: Skin is warm. She is diaphoretic.  Has intact and burst blisters on bilateral feet right >left  Her right foot is inflamed    ASSESSMENT/ PLAN:  TODAY:   Altered mental status Diabetic feet ulcerations Diabetes  Will get status cbc; cmp; chest x-ray; venous  doppler on right lower extremity and bilateral ABIs will begin NS at 75 cc/hr for 3 liters   After discussion with nursing staff will send to the ED for further evaluation    MD is aware of resident's narcotic use and is in agreement with current plan of care. We will attempt to wean resident as apropriate   Synthia Innocent NP [facility]  Contact [phone] Monday through Friday 8am- 5pm  After hours call [phone]

## 2017-02-07 NOTE — ED Notes (Signed)
Pt assigned a non Tele bed but patient needed Tele.Called bed placement and notified and they will look at issue and call back. Patient will need to be moved to Tele.

## 2017-02-07 NOTE — ED Triage Notes (Signed)
Per EMS, pt is from [facility]. C/o hyperglycemia x 2 days. Staff gave pt 10 units of novolog this morning. Pt not able to communicate well per daughter at the bedside. No acute distress noted. EMS also reports wounds to both feet which were covered by staff this morning.

## 2017-02-07 NOTE — ED Notes (Signed)
ED TO INPATIENT HANDOFF REPORT  Name/Age/Gender Cynthia [name] 83 y.o. female  Code Status Code Status History    Date Active Date Inactive Code Status Order ID Comments User Context   10/21/2016 18:44 10/23/2016 14:58 DNR [id number]  Caren Griffins, MD Inpatient    Questions for Most Recent Historical Code Status (Order [id number])    Question Answer Comment   In the event of cardiac or respiratory ARREST Do not call a "code blue"    In the event of cardiac or respiratory ARREST Do not perform Intubation, CPR, defibrillation or ACLS    In the event of cardiac or respiratory ARREST Use medication by any route, position, wound care, and other measures to relive pain and suffering. May use oxygen, suction and manual treatment of airway obstruction as needed for comfort.         Advance Directive Documentation     Most Recent Value  Type of Advance Directive  Out of facility DNR (pink MOST or yellow form)  Pre-existing out of facility DNR order (yellow form or pink MOST form)  Yellow form placed in chart (order not valid for inpatient use)  "MOST" Form in Place?  No data      Home/SNF/Other Nursing Home  Chief Complaint Hyperglycemia  Level of Care/Admitting Diagnosis ED Disposition    ED Disposition Condition Comment   Admit  Hospital Area: [facility] [[id number]]  Level of Care: Telemetry [5]  Admit to tele based on following criteria: Monitor QTC interval  Diagnosis: UTI (urinary tract infection) [664403]  Admitting Physician: Loree Fee  Attending Physician: Oswald Hillock [[id number]]  Estimated length of stay: 3 - 4 days  Certification:: I certify this patient will need inpatient services for at least 2 midnights  PT Class (Do Not Modify): Inpatient [101]  PT Acc Code (Do Not Modify): Private [1]       Medical History Past Medical History:  Diagnosis Date  . CKD (chronic kidney disease)   . Dementia   . Diabetes mellitus without  complication ([place])   . Hypertension   . Hypothyroid   . Hypothyroid   . Thyroid disease     Allergies No Known Allergies  IV Location/Drains/Wounds Patient Lines/Drains/Airways Status   Active Line/Drains/Airways    Name:   Placement date:   Placement time:   Site:   Days:   Peripheral IV 02/07/17 Left Antecubital   02/07/17    -    Antecubital   less than 1   Incision (Closed) 10/22/16 Axilla Left   10/22/16    0812     108          Labs/Imaging Results for orders placed or performed during the hospital encounter of 02/07/17 (from the past 48 hour(s))  POC CBG, ED     Status: Abnormal   Collection Time: 02/07/17 11:49 AM  Result Value Ref Range   Glucose-Capillary 290 (H) 65 - 99 mg/dL  Urinalysis, Routine w reflex microscopic     Status: Abnormal   Collection Time: 02/07/17 11:58 AM  Result Value Ref Range   Color, Urine YELLOW YELLOW   APPearance CLOUDY (A) CLEAR   Specific Gravity, Urine 1.021 1.005 - 1.030   pH 5.0 5.0 - 8.0   Glucose, UA 50 (A) NEGATIVE mg/dL   Hgb urine dipstick SMALL (A) NEGATIVE   Bilirubin Urine NEGATIVE NEGATIVE   Ketones, ur NEGATIVE NEGATIVE mg/dL   Protein, ur NEGATIVE NEGATIVE mg/dL   Nitrite POSITIVE (  A) NEGATIVE   Leukocytes, UA LARGE (A) NEGATIVE   RBC / HPF 0-5 0 - 5 RBC/hpf   WBC, UA TOO NUMEROUS TO COUNT 0 - 5 WBC/hpf   Bacteria, UA MANY (A) NONE SEEN   Squamous Epithelial / LPF 0-5 (A) NONE SEEN   WBC Clumps PRESENT    Mucus PRESENT    Hyaline Casts, UA PRESENT    Non Squamous Epithelial 0-5 (A) NONE SEEN  Comprehensive metabolic panel     Status: Abnormal   Collection Time: 02/07/17 12:03 PM  Result Value Ref Range   Sodium 156 (H) 135 - 145 mmol/L   Potassium 4.9 3.5 - 5.1 mmol/L   Chloride 125 (H) 101 - 111 mmol/L   CO2 23 22 - 32 mmol/L   Glucose, Bld 335 (H) 65 - 99 mg/dL   BUN 45 (H) 6 - 20 mg/dL   Creatinine, Ser 1.57 (H) 0.44 - 1.00 mg/dL   Calcium 9.0 8.9 - 10.3 mg/dL   Total Protein 6.6 6.5 - 8.1 g/dL    Albumin 2.6 (L) 3.5 - 5.0 g/dL   AST 29 15 - 41 U/L   ALT 16 14 - 54 U/L   Alkaline Phosphatase 77 38 - 126 U/L   Total Bilirubin 1.2 0.3 - 1.2 mg/dL   GFR calc non Af Amer 29 (L) >60 mL/min   GFR calc Af Amer 33 (L) >60 mL/min    Comment: (NOTE) The eGFR has been calculated using the CKD EPI equation. This calculation has not been validated in all clinical situations. eGFR's persistently <60 mL/min signify possible Chronic Kidney Disease.    Anion gap 8 5 - 15  CBC with Differential     Status: Abnormal   Collection Time: 02/07/17 12:03 PM  Result Value Ref Range   WBC 11.8 (H) 4.0 - 10.5 K/uL   RBC 4.17 3.87 - 5.11 MIL/uL   Hemoglobin 11.8 (L) 12.0 - 15.0 g/dL   HCT 38.4 36.0 - 46.0 %   MCV 92.1 78.0 - 100.0 fL   MCH 28.3 26.0 - 34.0 pg   MCHC 30.7 30.0 - 36.0 g/dL   RDW 16.6 (H) 11.5 - 15.5 %   Platelets 293 150 - 400 K/uL   Neutrophils Relative % 78 %   Neutro Abs 9.3 (H) 1.7 - 7.7 K/uL   Lymphocytes Relative 16 %   Lymphs Abs 1.9 0.7 - 4.0 K/uL   Monocytes Relative 4 %   Monocytes Absolute 0.5 0.1 - 1.0 K/uL   Eosinophils Relative 2 %   Eosinophils Absolute 0.2 0.0 - 0.7 K/uL   Basophils Relative 0 %   Basophils Absolute 0.0 0.0 - 0.1 K/uL  I-stat troponin, ED     Status: None   Collection Time: 02/07/17 12:30 PM  Result Value Ref Range   Troponin i, poc 0.08 0.00 - 0.08 ng/mL   Comment 3            Comment: Due to the release kinetics of cTnI, a negative result within the first hours of the onset of symptoms does not rule out myocardial infarction with certainty. If myocardial infarction is still suspected, repeat the test at appropriate intervals.   Blood gas, venous     Status: Abnormal   Collection Time: 02/07/17 12:45 PM  Result Value Ref Range   pH, Ven 7.410 7.250 - 7.430   pCO2, Ven 37.2 (L) 44.0 - 60.0 mmHg   pO2, Ven BELOW REPORTABLE RANGE. 32.0 - 45.0 mmHg  Comment: CRITICAL RESULT CALLED TO, READ BACK BY AND VERIFIED WITH:  DR. LITTLE AT 1240  BY DEE WALTERS RRT ON 02/07/17    Bicarbonate 23.1 20.0 - 28.0 mmol/L   Acid-base deficit 0.7 0.0 - 2.0 mmol/L   O2 Saturation 53.7 %   Patient temperature 98.6    Collection site DRAWN BY RN    Drawn by DRAWN BY RN    Sample type VEIN   I-Stat CG4 Lactic Acid, ED     Status: Abnormal   Collection Time: 02/07/17 12:52 PM  Result Value Ref Range   Lactic Acid, Venous 2.51 (HH) 0.5 - 1.9 mmol/L   Comment NOTIFIED PHYSICIAN   I-Stat CG4 Lactic Acid, ED     Status: None   Collection Time: 02/07/17  2:40 PM  Result Value Ref Range   Lactic Acid, Venous 1.78 0.5 - 1.9 mmol/L  CBG monitoring, ED     Status: Abnormal   Collection Time: 02/07/17  3:23 PM  Result Value Ref Range   Glucose-Capillary 254 (H) 65 - 99 mg/dL   Dg Chest 2 View  Result Date: 02/07/2017 CLINICAL DATA:  Lethargic and week. EXAM: CHEST  2 VIEW COMPARISON:  03/01/2016 FINDINGS: Knee heart is mildly enlarged but stable. There is moderate tortuosity of the thoracic aorta. The pulmonary hila appear normal. Low lung volumes with vascular crowding and streaky basilar atelectasis but no infiltrates, edema or effusions. The bony thorax is intact. Remote T12 compression fracture is noted. Advanced degenerative changes involving both shoulders and probable bilateral rotator cuff disease. IMPRESSION: No acute cardiopulmonary findings. Electronically Signed   By: Marijo Sanes M.D.   On: 02/07/2017 12:31    Pending Labs Unresulted Labs (From admission, onward)   Start     Ordered   02/07/17 1706  Magnesium  Once,   R     02/07/17 1705   02/07/17 1505  Urine culture  STAT,   STAT    Question:  Patient immune status  Answer:  Normal   02/07/17 1506   Signed and Held  CBC  Tomorrow morning,   R     Signed and Held   Signed and Held  Comprehensive metabolic panel  Tomorrow morning,   R     Signed and Held   Signed and Held  Hemoglobin A1c  Once,   R    Comments:  To assess prior glycemic control    Signed and Held       Vitals/Pain Today's Vitals   02/07/17 1430 02/07/17 1500 02/07/17 1530 02/07/17 1600  BP: (!) 157/64 (!) 133/57 (!) 150/75 (!) 128/102  Pulse: 75 74 74 70  Resp: 17 13 18 16   Temp:      TempSrc:      SpO2: 98% 99% 100% 97%    Isolation Precautions No active isolations  Medications Medications  sodium chloride 0.9 % bolus 1,000 mL (0 mLs Intravenous Stopped 02/07/17 1401)  cefTRIAXone (ROCEPHIN) 1 g in dextrose 5 % 50 mL IVPB (0 g Intravenous Stopped 02/07/17 1604)    Mobility Needs assistance

## 2017-02-07 NOTE — ED Notes (Signed)
CBG 254 

## 2017-02-08 DIAGNOSIS — L8990 Pressure ulcer of unspecified site, unspecified stage: Secondary | ICD-10-CM

## 2017-02-08 LAB — CBC
HCT: 38.7 % (ref 36.0–46.0)
HEMOGLOBIN: 11.5 g/dL — AB (ref 12.0–15.0)
MCH: 27.7 pg (ref 26.0–34.0)
MCHC: 29.7 g/dL — ABNORMAL LOW (ref 30.0–36.0)
MCV: 93.3 fL (ref 78.0–100.0)
Platelets: 282 10*3/uL (ref 150–400)
RBC: 4.15 MIL/uL (ref 3.87–5.11)
RDW: 16.7 % — ABNORMAL HIGH (ref 11.5–15.5)
WBC: 10 10*3/uL (ref 4.0–10.5)

## 2017-02-08 LAB — COMPREHENSIVE METABOLIC PANEL
ALK PHOS: 79 U/L (ref 38–126)
ALT: 18 U/L (ref 14–54)
AST: 17 U/L (ref 15–41)
Albumin: 2.6 g/dL — ABNORMAL LOW (ref 3.5–5.0)
Anion gap: 9 (ref 5–15)
BUN: 44 mg/dL — AB (ref 6–20)
CALCIUM: 8.8 mg/dL — AB (ref 8.9–10.3)
CO2: 23 mmol/L (ref 22–32)
CREATININE: 1.41 mg/dL — AB (ref 0.44–1.00)
Chloride: 123 mmol/L — ABNORMAL HIGH (ref 101–111)
GFR calc Af Amer: 38 mL/min — ABNORMAL LOW (ref >60)
GFR, EST NON AFRICAN AMERICAN: 32 mL/min — AB (ref >60)
Glucose, Bld: 432 mg/dL — ABNORMAL HIGH (ref 65–99)
Potassium: 4.6 mmol/L (ref 3.5–5.1)
SODIUM: 155 mmol/L — AB (ref 135–145)
Total Bilirubin: 0.6 mg/dL (ref 0.3–1.2)
Total Protein: 6.7 g/dL (ref 6.5–8.1)

## 2017-02-08 LAB — GLUCOSE, CAPILLARY
GLUCOSE-CAPILLARY: 343 mg/dL — AB (ref 65–99)
GLUCOSE-CAPILLARY: 348 mg/dL — AB (ref 65–99)
GLUCOSE-CAPILLARY: 258 mg/dL — AB (ref 65–99)
Glucose-Capillary: 238 mg/dL — ABNORMAL HIGH (ref 65–99)

## 2017-02-08 MED ORDER — RIVAROXABAN 20 MG PO TABS
20.0000 mg | ORAL_TABLET | Freq: Every day | ORAL | Status: DC
  Administered 2017-02-08 – 2017-02-09 (×2): 20 mg via ORAL
  Filled 2017-02-08 (×2): qty 1

## 2017-02-08 MED ORDER — INSULIN ASPART 100 UNIT/ML ~~LOC~~ SOLN
0.0000 [IU] | Freq: Every day | SUBCUTANEOUS | Status: DC
  Administered 2017-02-08: 3 [IU] via SUBCUTANEOUS
  Administered 2017-02-09: 2 [IU] via SUBCUTANEOUS

## 2017-02-08 MED ORDER — INSULIN ASPART 100 UNIT/ML ~~LOC~~ SOLN
0.0000 [IU] | Freq: Three times a day (TID) | SUBCUTANEOUS | Status: DC
  Administered 2017-02-08 – 2017-02-09 (×2): 7 [IU] via SUBCUTANEOUS
  Administered 2017-02-09: 3 [IU] via SUBCUTANEOUS
  Administered 2017-02-09: 7 [IU] via SUBCUTANEOUS
  Administered 2017-02-10: 3 [IU] via SUBCUTANEOUS

## 2017-02-08 MED ORDER — INSULIN DETEMIR 100 UNIT/ML ~~LOC~~ SOLN
20.0000 [IU] | Freq: Every day | SUBCUTANEOUS | Status: DC
  Administered 2017-02-08 – 2017-02-09 (×2): 20 [IU] via SUBCUTANEOUS
  Filled 2017-02-08 (×3): qty 0.2

## 2017-02-08 NOTE — Progress Notes (Signed)
[facility]  PROGRESS NOTE  Cynthia SaucierDorethea [name] ZOX:[id number] DOB: 08/13/1927 DOA: 02/07/2017 PCP: Kirt Boysarter, Monica, DO   Brief HPI:    83 y.o. female, with history of dementia, diabetes mellitus, hypothyroidism, recurrent DVTs was sent to ED for hyperglycemia.  At the skilled facility patient's blood glucose was 400-550 range.  Patient has underlying dementia and is not very interactive at baseline.  She is bedbound.  But as per daughter she has not been eating and drinking for the past few days.  There is no report of fever or chills.  No chest pain or shortness of breath.  In the ED patient was found to have abnormal UA.  Started on IV ceftriaxone. Also found to have hyponatremia with sodium 156     Subjective   Patient is more alert today.    Assessment/Plan:      1. UTI-urine culture has been obtained in the ED.  Continue IV ceftriaxone.  Follow urine culture results. 2. Hyperglycemia/diabetes mellitus-blood glucose is now elevated, as patient is on D5W for hypernatremia.  Will change Levemir to 20 units subcu daily, change sliding scale insulin to resistant  3. Hypernatremia-sodium 155, chloride 123.    Continue D5W at 75 mL/h.  .   4. Hypothyroidism-continue Synthroid 5. Hypertension-continue amlodipine 6. Recurrent DVTs-continue Xarelto 7. Dementia, no behavioral disturbance-continue Namenda, Seroquel 8. Leg ulcers- consulted wound care. 9. Prolonged QTC-serum magnesium was 2.1 continue monitoring on telemetry.       DVT prophylaxis: Xarelto  Code Status: DNR  Family Communication:  Discussed with daughter at bedside  Disposition Plan: SNF, once medically stable   Consultants:  None   Procedures:  None   Continuous infusions . cefTRIAXone (ROCEPHIN)  IV Stopped (02/08/17 1430)  . dextrose 75 mL/hr at 02/08/17 1043      Antibiotics:   Anti-infectives (From admission, onward)   Start     Dose/Rate Route Frequency Ordered Stop   02/08/17  1500  cefTRIAXone (ROCEPHIN) 1 g in dextrose 5 % 50 mL IVPB     1 g 100 mL/hr over 30 Minutes Intravenous Every 24 hours 02/07/17 1925     02/07/17 1515  cefTRIAXone (ROCEPHIN) 1 g in dextrose 5 % 50 mL IVPB     1 g 100 mL/hr over 30 Minutes Intravenous  Once 02/07/17 1506 02/07/17 1604       Objective   Vitals:   02/07/17 1851 02/07/17 2121 02/08/17 0545 02/08/17 1338  BP: (!) 151/67 (!) 132/91 (!) 142/65 126/64  Pulse: 85 86 65 84  Resp: 15 18 18 18   Temp: 99.2 F (37.3 C) 98.1 F (36.7 C) 98.2 F (36.8 C) 98.4 F (36.9 C)  TempSrc: Axillary Oral Oral Oral  SpO2:  97% 93% 100%  Weight: 70.4 kg (155 lb 3.3 oz)     Height: 5\' 7"  (1.702 m)       Intake/Output Summary (Last 24 hours) at 02/08/2017 1639 Last data filed at 02/08/2017 1600 Gross per 24 hour  Intake 1413.75 ml  Output 400 ml  Net 1013.75 ml   Filed Weights   02/07/17 1851  Weight: 70.4 kg (155 lb 3.3 oz)     Physical Examination:   Physical Exam: Eyes: No icterus, extraocular muscles intact  Mouth: Oral mucosa is moist, no lesions on palate,  Neck: Supple, no deformities, masses, or tenderness Lungs: Normal respiratory effort, bilateral clear to auscultation, no crackles or wheezes.  Heart: Regular rate and rhythm, S1 and S2 normal, no murmurs, rubs auscultated Abdomen:  BS normoactive,soft,nondistended,non-tender to palpation,no organomegaly Extremities: No pretibial edema, no erythema, no cyanosis, no clubbing Neuro : Alert and oriented to time, place and person, No focal deficits  Skin: No rashes seen on exam     Data Reviewed: I have personally reviewed following labs and imaging studies  CBG: Recent Labs  Lab 02/07/17 1149 02/07/17 1523 02/07/17 2116 02/08/17 0609 02/08/17 1144  GLUCAP 290* 254* 306* 343* 348*    CBC: Recent Labs  Lab 02/07/17 1203 02/08/17 0513  WBC 11.8* 10.0  NEUTROABS 9.3*  --   HGB 11.8* 11.5*  HCT 38.4 38.7  MCV 92.1 93.3  PLT 293 282    Basic  Metabolic Panel: Recent Labs  Lab 02/07/17 1203 02/07/17 1901 02/08/17 0513  NA 156*  --  155*  K 4.9  --  4.6  CL 125*  --  123*  CO2 23  --  23  GLUCOSE 335*  --  432*  BUN 45*  --  44*  CREATININE 1.57*  --  1.41*  CALCIUM 9.0  --  8.8*  MG  --  2.1  --     Recent Results (from the past 240 hour(s))  MRSA PCR Screening     Status: None   Collection Time: 02/07/17  6:57 PM  Result Value Ref Range Status   MRSA by PCR NEGATIVE NEGATIVE Final    Comment:        The GeneXpert MRSA Assay (FDA approved for NASAL specimens only), is one component of a comprehensive MRSA colonization surveillance program. It is not intended to diagnose MRSA infection nor to guide or monitor treatment for MRSA infections.      Liver Function Tests: Recent Labs  Lab 02/07/17 1203 02/08/17 0513  AST 29 17  ALT 16 18  ALKPHOS 77 79  BILITOT 1.2 0.6  PROT 6.6 6.7  ALBUMIN 2.6* 2.6*   No results for input(s): LIPASE, AMYLASE in the last 168 hours. No results for input(s): AMMONIA in the last 168 hours.     Studies: Dg Chest 2 View  Result Date: 02/07/2017 CLINICAL DATA:  Lethargic and week. EXAM: CHEST  2 VIEW COMPARISON:  03/01/2016 FINDINGS: Knee heart is mildly enlarged but stable. There is moderate tortuosity of the thoracic aorta. The pulmonary hila appear normal. Low lung volumes with vascular crowding and streaky basilar atelectasis but no infiltrates, edema or effusions. The bony thorax is intact. Remote T12 compression fracture is noted. Advanced degenerative changes involving both shoulders and probable bilateral rotator cuff disease. IMPRESSION: No acute cardiopulmonary findings. Electronically Signed   By: Rudie Meyer M.D.   On: 02/07/2017 12:31    Scheduled Meds: . acetaminophen  500 mg Oral TID  . amLODipine  10 mg Oral Daily  . insulin aspart  0-20 Units Subcutaneous TID WC  . insulin aspart  0-5 Units Subcutaneous QHS  . insulin detemir  20 Units Subcutaneous  QHS  . levothyroxine  137 mcg Oral QAC breakfast  . LORazepam  0.5 mg Oral Daily  . memantine  10 mg Oral BID  . pantoprazole  40 mg Oral Daily  . QUEtiapine  25 mg Oral Daily  . QUEtiapine  50 mg Oral QHS  . rivaroxaban  20 mg Oral Q supper  . sertraline  100 mg Oral Daily  . vitamin B-12  1,000 mcg Oral Daily      Time spent: 25 min  Meredeth Ide   [facility] Pager [phone]. If 7PM-7AM, please contact night-coverage  at [email or web address], Office  [phone]  password [email or web address]  02/08/2017, 4:39 PM  LOS: 1 day

## 2017-02-09 LAB — GLUCOSE, CAPILLARY
GLUCOSE-CAPILLARY: 229 mg/dL — AB (ref 65–99)
Glucose-Capillary: 147 mg/dL — ABNORMAL HIGH (ref 65–99)
Glucose-Capillary: 223 mg/dL — ABNORMAL HIGH (ref 65–99)
Glucose-Capillary: 243 mg/dL — ABNORMAL HIGH (ref 65–99)

## 2017-02-09 LAB — BASIC METABOLIC PANEL
Anion gap: 6 (ref 5–15)
BUN: 32 mg/dL — ABNORMAL HIGH (ref 6–20)
CALCIUM: 8.6 mg/dL — AB (ref 8.9–10.3)
CO2: 24 mmol/L (ref 22–32)
CREATININE: 1.3 mg/dL — AB (ref 0.44–1.00)
Chloride: 122 mmol/L — ABNORMAL HIGH (ref 101–111)
GFR, EST AFRICAN AMERICAN: 42 mL/min — AB (ref >60)
GFR, EST NON AFRICAN AMERICAN: 36 mL/min — AB (ref >60)
Glucose, Bld: 208 mg/dL — ABNORMAL HIGH (ref 65–99)
Potassium: 3.4 mmol/L — ABNORMAL LOW (ref 3.5–5.1)
SODIUM: 152 mmol/L — AB (ref 135–145)

## 2017-02-09 LAB — CBC
HEMATOCRIT: 34 % — AB (ref 36.0–46.0)
Hemoglobin: 10.2 g/dL — ABNORMAL LOW (ref 12.0–15.0)
MCH: 27.8 pg (ref 26.0–34.0)
MCHC: 30 g/dL (ref 30.0–36.0)
MCV: 92.6 fL (ref 78.0–100.0)
PLATELETS: 244 10*3/uL (ref 150–400)
RBC: 3.67 MIL/uL — ABNORMAL LOW (ref 3.87–5.11)
RDW: 16.7 % — AB (ref 11.5–15.5)
WBC: 7.9 10*3/uL (ref 4.0–10.5)

## 2017-02-09 MED ORDER — POTASSIUM CHLORIDE CRYS ER 20 MEQ PO TBCR
40.0000 meq | EXTENDED_RELEASE_TABLET | Freq: Once | ORAL | Status: AC
  Administered 2017-02-09: 40 meq via ORAL
  Filled 2017-02-09: qty 2

## 2017-02-09 NOTE — Progress Notes (Signed)
[facility]  PROGRESS NOTE  Cynthia [name] GNF:[id number] DOB: 03-20-1927 DOA: 02/07/2017 PCP: Kirt Boys, DO   Brief HPI:    83 y.o. female, with history of dementia, diabetes mellitus, hypothyroidism, recurrent DVTs was sent to ED for hyperglycemia.  At the skilled facility patient's blood glucose was 400-550 range.  Patient has underlying dementia and is not very interactive at baseline.  She is bedbound.  But as per daughter she has not been eating and drinking for the past few days.  There is no report of fever or chills.  No chest pain or shortness of breath.  In the ED patient was found to have abnormal UA.  Started on IV ceftriaxone. Also found to have hyponatremia with sodium 156     Subjective   Patient seen and examined, denies any complaints.  Blood glucose has improved   Assessment/Plan:      1. UTI-urine culture has been obtained in the ED.  Continue IV ceftriaxone.  Follow urine culture results. 2. Hyperglycemia/diabetes mellitus-blood glucose has improved, continue resistant sliding scale i, as patient is on D5W for hypernatremia.  Will change Levemir to 20 units subcu daily 3. Hypernatremia-sodium 152, chloride 122.    Continue D5W at 100 ml/h 4. Acute kidney injury-creatinine slowly improving.  Follow BMP in a.m. 5. Hypothyroidism-continue Synthroid 6. Hypertension-continue amlodipine 7. Recurrent DVTs-continue Xarelto 8. Dementia, no behavioral disturbance-continue Namenda, Seroquel 9. Leg ulcers- consulted wound care. 10. Prolonged QTC-serum magnesium was 2.1 continue monitoring on telemetry.       DVT prophylaxis: Xarelto  Code Status: DNR  Family Communication:  Discussed with daughter at bedside  Disposition Plan: SNF, once medically stable   Consultants:  None   Procedures:  None   Continuous infusions . cefTRIAXone (ROCEPHIN)  IV Stopped (02/08/17 1430)  . dextrose 100 mL/hr at 02/09/17 0818      Antibiotics:    Anti-infectives (From admission, onward)   Start     Dose/Rate Route Frequency Ordered Stop   02/08/17 1500  cefTRIAXone (ROCEPHIN) 1 g in dextrose 5 % 50 mL IVPB     1 g 100 mL/hr over 30 Minutes Intravenous Every 24 hours 02/07/17 1925     02/07/17 1515  cefTRIAXone (ROCEPHIN) 1 g in dextrose 5 % 50 mL IVPB     1 g 100 mL/hr over 30 Minutes Intravenous  Once 02/07/17 1506 02/07/17 1604       Objective   Vitals:   02/08/17 0545 02/08/17 1338 02/08/17 2209 02/09/17 0529  BP: (!) 142/65 126/64 (!) 107/47 (!) 132/50  Pulse: 65 84 70 68  Resp: Temp: 98.2 F (36.8 C) 98.4 F (36.9 C) 98.2 F (36.8 C) 98.5 F (36.9 C)  TempSrc: Oral Oral Axillary Axillary  SpO2: 93% 100% 99% 99%  Weight:      Height:        Intake/Output Summary (Last 24 hours) at 02/09/2017 1208 Last data filed at 02/09/2017 0500 Gross per 24 hour  Intake 1850 ml  Output 300 ml  Net 1550 ml   Filed Weights   02/07/17 1851  Weight: 70.4 kg (155 lb 3.3 oz)     Physical Examination:  Physical Exam: Eyes: No icterus, extraocular muscles intact  Mouth: Oral mucosa is moist, no lesions on palate,  Neck: Supple, no deformities, masses, or tenderness Lungs: Normal respiratory effort, bilateral clear to auscultation, no crackles or wheezes.  Heart: Regular rate and rhythm, S1 and S2 normal, no murmurs, rubs auscultated  Abdomen: BS normoactive,soft,nondistended,non-tender to palpation,no organomegaly Extremities: No pretibial edema, no erythema, no cyanosis, no clubbing Neuro : Alert and oriented to time, place and person, No focal deficits Skin: No rashes seen on exam    Data Reviewed: I have personally reviewed following labs and imaging studies  CBG: Recent Labs  Lab 02/08/17 1144 02/08/17 1732 02/08/17 2126 02/09/17 0811 02/09/17 1138  GLUCAP 348* 238* 258* 147* 229*    CBC: Recent Labs  Lab 02/07/17 1203 02/08/17 0513 02/09/17 0456  WBC 11.8* 10.0 7.9  NEUTROABS 9.3*   --   --   HGB 11.8* 11.5* 10.2*  HCT 38.4 38.7 34.0*  MCV 92.1 93.3 92.6  PLT 293 282 244    Basic Metabolic Panel: Recent Labs  Lab 02/07/17 1203 02/07/17 1901 02/08/17 0513 02/09/17 0456  NA 156*  --  155* 152*  K 4.9  --  4.6 3.4*  CL 125*  --  123* 122*  CO2 23  --  23 24  GLUCOSE 335*  --  432* 208*  BUN 45*  --  44* 32*  CREATININE 1.57*  --  1.41* 1.30*  CALCIUM 9.0  --  8.8* 8.6*  MG  --  2.1  --   --     Recent Results (from the past 240 hour(s))  Urine culture     Status: Abnormal (Preliminary result)   Collection Time: 02/07/17 11:58 AM  Result Value Ref Range Status   Specimen Description URINE, CATHETERIZED  Final   Special Requests Normal  Final   Culture >=100,000 COLONIES/mL GRAM NEGATIVE RODS (A)  Final   Report Status PENDING  Incomplete  MRSA PCR Screening     Status: None   Collection Time: 02/07/17  6:57 PM  Result Value Ref Range Status   MRSA by PCR NEGATIVE NEGATIVE Final    Comment:        The GeneXpert MRSA Assay (FDA approved for NASAL specimens only), is one component of a comprehensive MRSA colonization surveillance program. It is not intended to diagnose MRSA infection nor to guide or monitor treatment for MRSA infections.      Liver Function Tests: Recent Labs  Lab 02/07/17 1203 02/08/17 0513  AST 29 17  ALT 16 18  ALKPHOS 77 79  BILITOT 1.2 0.6  PROT 6.6 6.7  ALBUMIN 2.6* 2.6*   No results for input(s): LIPASE, AMYLASE in the last 168 hours. No results for input(s): AMMONIA in the last 168 hours.     Studies: Dg Chest 2 View  Result Date: 02/07/2017 CLINICAL DATA:  Lethargic and week. EXAM: CHEST  2 VIEW COMPARISON:  03/01/2016 FINDINGS: Knee heart is mildly enlarged but stable. There is moderate tortuosity of the thoracic aorta. The pulmonary hila appear normal. Low lung volumes with vascular crowding and streaky basilar atelectasis but no infiltrates, edema or effusions. The bony thorax is intact. Remote T12  compression fracture is noted. Advanced degenerative changes involving both shoulders and probable bilateral rotator cuff disease. IMPRESSION: No acute cardiopulmonary findings. Electronically Signed   By: Rudie MeyerP.  Gallerani M.D.   On: 02/07/2017 12:31    Scheduled Meds: . acetaminophen  500 mg Oral TID  . amLODipine  10 mg Oral Daily  . insulin aspart  0-20 Units Subcutaneous TID WC  . insulin aspart  0-5 Units Subcutaneous QHS  . insulin detemir  20 Units Subcutaneous QHS  . levothyroxine  137 mcg Oral QAC breakfast  . LORazepam  0.5 mg Oral Daily  . memantine  10 mg Oral BID  . pantoprazole  40 mg Oral Daily  . QUEtiapine  25 mg Oral Daily  . QUEtiapine  50 mg Oral QHS  . rivaroxaban  20 mg Oral Q supper  . sertraline  100 mg Oral Daily  . vitamin B-12  1,000 mcg Oral Daily      Time spent: 25 min  Meredeth Ide   [facility] Pager [phone]. If 7PM-7AM, please contact night-coverage at [email or web address], Office  [phone]  password [email or web address]  02/09/2017, 12:08 PM  LOS: 2 days

## 2017-02-09 NOTE — Progress Notes (Signed)
PHARMACY NOTE -  Ceftriaxone  Pharmacy has been assisting with dosing of Ceftriaxone for UTI. Dosage remains stable at Ceftriaxone 1g IV q24h and need for further dosage adjustment appears unlikely at present.    Will sign off at this time.  Please reconsult if a change in clinical status warrants re-evaluation of dosage.  Clance BollAmanda Maryori Weide, PharmD, BCPS Pager: [phone] 02/09/2017 1:25 PM

## 2017-02-10 LAB — CBC
HCT: 32.9 % — ABNORMAL LOW (ref 36.0–46.0)
HEMOGLOBIN: 9.9 g/dL — AB (ref 12.0–15.0)
MCH: 27.5 pg (ref 26.0–34.0)
MCHC: 30.1 g/dL (ref 30.0–36.0)
MCV: 91.4 fL (ref 78.0–100.0)
Platelets: 215 10*3/uL (ref 150–400)
RBC: 3.6 MIL/uL — ABNORMAL LOW (ref 3.87–5.11)
RDW: 15.8 % — AB (ref 11.5–15.5)
WBC: 7 10*3/uL (ref 4.0–10.5)

## 2017-02-10 LAB — BASIC METABOLIC PANEL
ANION GAP: 5 (ref 5–15)
BUN: 24 mg/dL — ABNORMAL HIGH (ref 6–20)
CALCIUM: 8.1 mg/dL — AB (ref 8.9–10.3)
CO2: 21 mmol/L — ABNORMAL LOW (ref 22–32)
CREATININE: 1.18 mg/dL — AB (ref 0.44–1.00)
Chloride: 117 mmol/L — ABNORMAL HIGH (ref 101–111)
GFR calc non Af Amer: 40 mL/min — ABNORMAL LOW (ref >60)
GFR, EST AFRICAN AMERICAN: 47 mL/min — AB (ref >60)
Glucose, Bld: 178 mg/dL — ABNORMAL HIGH (ref 65–99)
Potassium: 3.9 mmol/L (ref 3.5–5.1)
SODIUM: 143 mmol/L (ref 135–145)

## 2017-02-10 LAB — URINE CULTURE
Culture: >=100000 — AB
Special Requests: NORMAL

## 2017-02-10 LAB — GLUCOSE, CAPILLARY
Glucose-Capillary: 108 mg/dL — ABNORMAL HIGH (ref 65–99)
Glucose-Capillary: 139 mg/dL — ABNORMAL HIGH (ref 65–99)

## 2017-02-10 MED ORDER — CEPHALEXIN 500 MG PO CAPS: 500.0000 mg | ORAL_CAPSULE | Freq: Two times a day (BID) | ORAL | 0 refills | Status: AC

## 2017-02-10 MED ORDER — COLLAGENASE 250 UNIT/GM EX OINT: TOPICAL_OINTMENT | Freq: Every day | CUTANEOUS | 0 refills | Status: DC

## 2017-02-10 MED ORDER — COLLAGENASE 250 UNIT/GM EX OINT
TOPICAL_OINTMENT | Freq: Every day | CUTANEOUS | Status: DC
Start: 2017-02-10 — End: 2017-02-10
  Administered 2017-02-10: 13:00:00 via TOPICAL
  Filled 2017-02-10: qty 90

## 2017-02-10 MED ORDER — LORAZEPAM 0.5 MG PO TABS: 0.5000 mg | ORAL_TABLET | Freq: Every day | ORAL | 0 refills | Status: DC

## 2017-02-10 NOTE — Plan of Care (Signed)
    Safety: Ability to remain free from injury will improve 02/10/2017 0547 - Progressing by Herbert PunAddison, Breck Hollinger Y, RN  Clinical Measurements: Diagnostic test results will improve 02/10/2017 0547 - Progressing by Herbert PunAddison, Ka Bench Y, RN    Elimination: Will not experience complications related to bowel motility 02/10/2017 0547 - Progressing by Herbert PunAddison, Leopoldo Mazzie Y, RN

## 2017-02-10 NOTE — NC FL2 (Signed)
[place] MEDICAID FL2 LEVEL OF CARE SCREENING TOOL     IDENTIFICATION  Patient Name: Cynthia [name] Birthdate: 07/30/1927 Sex: female Admission Date (Current Location): 02/07/2017  [facility] [place]   [facility] and Address[facility]  [facility],  [address], [place] [id number]      Provider Number: [id number]  Attending Physician Name and Address:  Meredeth IdeLama, Gagan S, MD  Relative Name and Phone Number:       Current Level of Care: Hospital Recommended Level of Care: Skilled Nursing Facility Prior Approval Number:    Date Approved/Denied:   PASRR Number:    Discharge Plan: SNF    Current Diagnoses: Patient Active Problem List   Diagnosis Date Noted  . Pressure injury of skin 02/08/2017  . Altered mental status, unspecified 02/07/2017  . Diabetic foot ulcer associated with type 2 diabetes mellitus (HCC) 02/07/2017  . UTI (urinary tract infection) 02/07/2017  . Dysphagia, oral phase 12/30/2016  . Clostridium difficile colitis 12/07/2016  . Weight loss 10/21/2016  . Hypernatremia 10/21/2016  . Depression with anxiety 05/21/2016  . Hypothyroidism due to acquired atrophy of thyroid 04/15/2016  . Cardiac arrhythmia 04/15/2016  . Dementia with behavioral disturbance 04/15/2016  . Psychosis (HCC) 04/15/2016  . Type II diabetes mellitus with stage 4 chronic kidney disease (HCC) 04/15/2016  . Hyperlipidemia associated with type 2 diabetes mellitus (HCC) 04/15/2016  . Hypertension associated with diabetes (HCC) 04/15/2016  . GERD without esophagitis 04/15/2016    Orientation RESPIRATION BLADDER Height & Weight     Self  Normal Indwelling catheter, Incontinent Weight: 155 lb 3.3 oz (70.4 kg) Height:  5\' 7"  (170.2 cm)  BEHAVIORAL SYMPTOMS/MOOD NEUROLOGICAL BOWEL NUTRITION STATUS      Incontinent Diet(DYS 1)  AMBULATORY STATUS COMMUNICATION OF NEEDS Skin   Total Care Verbally PU Stage and Appropriate Care   PU Stage 2 Dressing: (  Location: Ischial tuberosity Location Orientation: Left   Foam Dressing)     Deep Tissue Injury - Purple or maroon localized area of discolored intact skin or blood-filled blister due to damage of underlying soft tissue from pressure and/or shear.  Location: Ankle Location Orientation: Left; Lateral   Foam Dressing  Unstageable - Full thickness tissue loss in which the base of the ulcer is covered by slough (yellow, tan, gray, green or brown) and/or eschar (tan, brown or black) in the wound bed.  Location: Leg Location Orientation: Left; Medial; Mid; Foam Dressing  Deep Tissue Injury - Purple or maroon localized area of discolored intact skin or blood-filled blister due to damage of underlying soft tissue from pressure and/or shear.  Location: Ankle Location Orientation: Right; Lateral;  Foam Dressing  Deep Tissue Injury - Purple or maroon localized area of discolored intact skin or blood-filled blister due to damage of underlying soft tissue from pressure and/or shear. Location: Foot Location Orientation: Right; Medial; Mid  Foam Dressing               Personal Care Assistance Level of Assistance  Bathing, Feeding, Dressing Bathing Assistance: Maximum assistance Feeding assistance: Limited assistance Dressing Assistance: Maximum assistance     Functional Limitations Info  Sight, Hearing, Speech Sight Info: Adequate Hearing Info: Adequate Speech Info: Adequate    SPECIAL CARE FACTORS FREQUENCY                       Contractures Contractures Info: Not present    Additional Factors Info  Code Status, Allergies, Insulin Sliding Scale  Code Status Info: DNR Allergies Info: NKA   Insulin Sliding Scale Info: Inject 0-8 Units into the skin 3 (three) times daily before meals. Inject as per sliding scale subcutaneously before meals: 0 - 149 = 0 units 150 - 450 = 8 units Notify MD if less than 70 or greater than 450     Current Medications (02/10/2017):  This is the current  hospital active medication list Current Facility-Administered Medications  Medication Dose Route Frequency Provider Last Rate Last Dose  . acetaminophen (TYLENOL) tablet 500 mg  500 mg Oral TID Meredeth IdeLama, Gagan S, MD   500 mg at 02/10/17 1031  . amLODipine (NORVASC) tablet 10 mg  10 mg Oral Daily Meredeth IdeLama, Gagan S, MD   10 mg at 02/10/17 1031  . cefTRIAXone (ROCEPHIN) 1 g in dextrose 5 % 50 mL IVPB  1 g Intravenous Q24H Shade, Christine E, RPH 100 mL/hr at 02/10/17 1400 1 g at 02/10/17 1400  . collagenase (SANTYL) ointment   Topical Daily [place], Sarina IllGagan S, MD      . dextrose 5 % solution   Intravenous Continuous Meredeth IdeLama, Gagan S, MD 100 mL/hr at 02/10/17 1031    . insulin aspart (novoLOG) injection 0-20 Units  0-20 Units Subcutaneous TID WC Meredeth IdeLama, Gagan S, MD   3 Units at 02/10/17 1237  . insulin aspart (novoLOG) injection 0-5 Units  0-5 Units Subcutaneous QHS Meredeth IdeLama, Gagan S, MD   2 Units at 02/09/17 2300  . insulin detemir (LEVEMIR) injection 20 Units  20 Units Subcutaneous QHS Meredeth IdeLama, Gagan S, MD   20 Units at 02/09/17 2301  . levothyroxine (SYNTHROID, LEVOTHROID) tablet 137 mcg  137 mcg Oral QAC breakfast Meredeth IdeLama, Gagan S, MD   137 mcg at 02/10/17 1030  . LORazepam (ATIVAN) tablet 0.5 mg  0.5 mg Oral Daily Meredeth IdeLama, Gagan S, MD   0.5 mg at 02/10/17 1030  . memantine (NAMENDA) tablet 10 mg  10 mg Oral BID Meredeth IdeLama, Gagan S, MD   10 mg at 02/10/17 1031  . ondansetron (ZOFRAN) tablet 4 mg  4 mg Oral Q6H PRN Meredeth IdeLama, Gagan S, MD       Or  . ondansetron (ZOFRAN) injection 4 mg  4 mg Intravenous Q6H PRN Meredeth IdeLama, Gagan S, MD      . pantoprazole (PROTONIX) EC tablet 40 mg  40 mg Oral Daily Meredeth IdeLama, Gagan S, MD   40 mg at 02/10/17 1030  . QUEtiapine (SEROQUEL) tablet 25 mg  25 mg Oral Daily Meredeth IdeLama, Gagan S, MD   25 mg at 02/10/17 1031  . QUEtiapine (SEROQUEL) tablet 50 mg  50 mg Oral QHS Meredeth IdeLama, Gagan S, MD   50 mg at 02/09/17 2301  . rivaroxaban (XARELTO) tablet 20 mg  20 mg Oral Q supper Meredeth IdeLama, Gagan S, MD   20 mg at 02/09/17 1733  .  sertraline (ZOLOFT) tablet 100 mg  100 mg Oral Daily Meredeth IdeLama, Gagan S, MD   100 mg at 02/10/17 1031  . vitamin B-12 (CYANOCOBALAMIN) tablet 1,000 mcg  1,000 mcg Oral Daily Meredeth IdeLama, Gagan S, MD   1,000 mcg at 02/10/17 1030     Discharge Medications: Please see discharge summary for a list of discharge medications.  Relevant Imaging Results:  Relevant Lab Results:   Additional Information SSN   [id number]  Antionette PolesKimberly L Anwitha Mapes, LCSW

## 2017-02-10 NOTE — Progress Notes (Signed)
02/10/17  1519  Called [facility] [phone] to give report. Report given to Gibral[place]onda . Pt going to Rm# [id number].

## 2017-02-10 NOTE — Progress Notes (Signed)
Patient returning [facility]. Facility aware of patient's discharge and confirmed patient's ability to return. [facility]AR contacted, patient's family notified. Patient's RN can call report to [phone] Room 202B, packet complete. CSW signing off, no other needs identified at this time.  Celso SickleKimberly Nobel Brar, [place] Clinical Social Worker [facility] Cell#: [phone]

## 2017-02-10 NOTE — Progress Notes (Signed)
02/10/17  1311  Notified patient's daughter that we are planning to discharge her mom back to her SNF today. Daughter states she will meet [facility] at [facility].

## 2017-02-10 NOTE — Discharge Summary (Signed)
Physician Discharge Summary  Cynthia [name] ZOX:[id number] DOB: 02/13/1928 DOA: 02/07/2017  PCP: Kirt Boys, DO  Admit date: 02/07/2017 Discharge date: 02/10/2017  Time spent: 25* minutes  Recommendations for Outpatient Follow-up:  1. Follow up PCP in 2 weeks 2. Keflex 500 mg po BID for five days.   Discharge Diagnoses:  Active Problems:   UTI (urinary tract infection)   Pressure injury of skin   Discharge Condition: Stable  Diet recommendation: Carb modified diet  Filed Weights   02/07/17 1851  Weight: 70.4 kg (155 lb 3.3 oz)    History of present illness:  83 y.o.female,with history of dementia, diabetes mellitus, hypothyroidism, recurrent DVTs was sent to ED for hyperglycemia. At the skilled facility patient's blood glucose was 400-550 range.Patient has underlying dementia and is not very interactive at baseline. She is bedbound. But as per daughter she has not been eating and drinking for the past few days. There is no report of fever or chills. No chest pain or shortness of breath.    Hospital Course:  1. UTI-urine culture grew E Coli, sensitive to  ceftriaxone. Patient ws started on ceftriaxone, will discharge on po Keflex 500 mg po BID for 5 more days to complete 7 days of therapy 2. Hyperglycemia/diabetes mellitus-blood glucose has improved. Continue sliding scale insulin and levemir. 3. Hypernatremia- resolved, today sodium is 143. Patient was started on D5W. Will discontinue IV fluids. 4. Acute kidney injury- resolved, today cr is 1.18.   5. Hypothyroidism-continue Synthroid 6. Hypertension-continue amlodipine 7. Recurrent DVTs-continue Xarelto 8. Dementia,no behavioral disturbance-continue Namenda, Seroquel 9. Leg ulcers- consulted wound care. Continue Collagenase ointment daily on leg wounds. 10. Prolonged QTC- serum magnesium was 2.1. Avoid Q tc prolonging agents.     Procedures:  None   Consultations:  None   Discharge  Exam: Vitals:   02/10/17 0420 02/10/17 1000  BP: (!) 154/67 137/65  Pulse: 83 84  Resp: 18   Temp: 99 F (37.2 C)   SpO2: 100% 99%    General: Appears in no acute distress Cardiovascular: S1S2 RRR Respiratory: Clear bilaterally  Discharge Instructions    Allergies as of 02/10/2017   No Known Allergies     Medication List    TAKE these medications   acetaminophen 500 MG tablet Commonly known as:  TYLENOL Take 500 mg by mouth 3 (three) times daily. What changed:  Another medication with the same name was removed. Continue taking this medication, and follow the directions you see here.   amLODipine 10 MG tablet Commonly known as:  NORVASC Take 10 mg by mouth daily.   cephALEXin 500 MG capsule Commonly known as:  KEFLEX Take 1 capsule (500 mg total) by mouth 2 (two) times daily for 5 days.   collagenase ointment Commonly known as:  SANTYL Apply topically daily. Apply to wounds on both feet and left ischium   DECUBI-VITE Caps Take 1 capsule by mouth daily.   insulin detemir 100 UNIT/ML injection Commonly known as:  LEVEMIR Inject 15 Units into the skin at bedtime.   levothyroxine 137 MCG tablet Commonly known as:  SYNTHROID, LEVOTHROID Take 137 mcg by mouth daily.   loperamide 2 MG tablet Commonly known as:  IMODIUM A-D Take 2 mg by mouth every 8 (eight) hours as needed for diarrhea or loose stools.   LORazepam 0.5 MG tablet Commonly known as:  ATIVAN Take 1 tablet (0.5 mg total) by mouth daily. What changed:  Another medication with the same name was removed. Continue taking this medication,  and follow the directions you see here.   magnesium hydroxide 400 MG/5ML suspension Commonly known as:  MILK OF MAGNESIA Take 30 mLs by mouth daily as needed for mild constipation.   memantine 10 MG tablet Commonly known as:  NAMENDA Take 10 mg by mouth 2 (two) times daily.   NOVOLOG 100 UNIT/ML injection Generic drug:  insulin aspart Inject 0-8 Units into the  skin 3 (three) times daily before meals. Inject as per sliding scale subcutaneously before meals: 0 - 149 = 0 units 150 - 450 = 8 units Notify MD if less than 70 or greater than 450 What changed:  Another medication with the same name was removed. Continue taking this medication, and follow the directions you see here.   NUTRITIONAL SUPPLEMENT PO Take 120 mLs by mouth daily. *House Supplement*   omeprazole 20 MG capsule Commonly known as:  PRILOSEC Take 20 mg by mouth daily.   ondansetron 8 MG disintegrating tablet Commonly known as:  ZOFRAN-ODT Take 8 mg by mouth every 8 (eight) hours as needed for nausea or vomiting.   PROBIOTIC DAILY Caps Take 1 capsule by mouth 2 (two) times daily.   QUEtiapine 50 MG tablet Commonly known as:  SEROQUEL Take 25-50 mg by mouth 2 (two) times daily. Take 25 mg in the morning and 50 mg at bedtime   sertraline 100 MG tablet Commonly known as:  ZOLOFT Take 100 mg by mouth daily.   vitamin B-12 1000 MCG tablet Commonly known as:  CYANOCOBALAMIN Take 1,000 mcg by mouth daily.   XARELTO 20 MG Tabs tablet Generic drug:  rivaroxaban Take 20 mg by mouth daily.      No Known Allergies    The results of significant diagnostics from this hospitalization (including imaging, microbiology, ancillary and laboratory) are listed below for reference.    Significant Diagnostic Studies: Dg Chest 2 View  Result Date: 02/07/2017 CLINICAL DATA:  Lethargic and week. EXAM: CHEST  2 VIEW COMPARISON:  03/01/2016 FINDINGS: Knee heart is mildly enlarged but stable. There is moderate tortuosity of the thoracic aorta. The pulmonary hila appear normal. Low lung volumes with vascular crowding and streaky basilar atelectasis but no infiltrates, edema or effusions. The bony thorax is intact. Remote T12 compression fracture is noted. Advanced degenerative changes involving both shoulders and probable bilateral rotator cuff disease. IMPRESSION: No acute cardiopulmonary  findings. Electronically Signed   By: Rudie MeyerP.  Gallerani M.D.   On: 02/07/2017 12:31    Microbiology: Recent Results (from the past 240 hour(s))  Urine culture     Status: Abnormal   Collection Time: 02/07/17 11:58 AM  Result Value Ref Range Status   Specimen Description URINE, CATHETERIZED  Final   Special Requests Normal  Final   Culture >=100,000 COLONIES/mL ESCHERICHIA COLI (A)  Final   Report Status 02/10/2017 FINAL  Final   Organism ID, Bacteria ESCHERICHIA COLI (A)  Final      Susceptibility   Escherichia coli - MIC*    AMPICILLIN 8 SENSITIVE Sensitive     CEFAZOLIN <=4 SENSITIVE Sensitive     CEFTRIAXONE <=1 SENSITIVE Sensitive     CIPROFLOXACIN <=0.25 SENSITIVE Sensitive     GENTAMICIN <=1 SENSITIVE Sensitive     IMIPENEM <=0.25 SENSITIVE Sensitive     NITROFURANTOIN <=16 SENSITIVE Sensitive     TRIMETH/SULFA <=20 SENSITIVE Sensitive     AMPICILLIN/SULBACTAM 4 SENSITIVE Sensitive     PIP/TAZO <=4 SENSITIVE Sensitive     Extended ESBL NEGATIVE Sensitive     * >=  100,000 COLONIES/mL ESCHERICHIA COLI  MRSA PCR Screening     Status: None   Collection Time: 02/07/17  6:57 PM  Result Value Ref Range Status   MRSA by PCR NEGATIVE NEGATIVE Final    Comment:        The GeneXpert MRSA Assay (FDA approved for NASAL specimens only), is one component of a comprehensive MRSA colonization surveillance program. It is not intended to diagnose MRSA infection nor to guide or monitor treatment for MRSA infections.      Labs: Basic Metabolic Panel: Recent Labs  Lab 02/07/17 1203 02/07/17 1901 02/08/17 0513 02/09/17 0456 02/10/17 0421  NA 156*  --  155* 152* 143  K 4.9  --  4.6 3.4* 3.9  CL 125*  --  123* 122* 117*  CO2 23  --  23 24 21*  GLUCOSE 335*  --  432* 208* 178*  BUN 45*  --  44* 32* 24*  CREATININE 1.57*  --  1.41* 1.30* 1.18*  CALCIUM 9.0  --  8.8* 8.6* 8.1*  MG  --  2.1  --   --   --    Liver Function Tests: Recent Labs  Lab 02/07/17 1203 02/08/17 0513   AST 29 17  ALT 16 18  ALKPHOS 77 79  BILITOT 1.2 0.6  PROT 6.6 6.7  ALBUMIN 2.6* 2.6*   No results for input(s): LIPASE, AMYLASE in the last 168 hours. No results for input(s): AMMONIA in the last 168 hours. CBC: Recent Labs  Lab 02/07/17 1203 02/08/17 0513 02/09/17 0456 02/10/17 0421  WBC 11.8* 10.0 7.9 7.0  NEUTROABS 9.3*  --   --   --   HGB 11.8* 11.5* 10.2* 9.9*  HCT 38.4 38.7 34.0* 32.9*  MCV 92.1 93.3 92.6 91.4  PLT 293 282 244 215    CBG: Recent Labs  Lab 02/09/17 1138 02/09/17 1647 02/09/17 2049 02/10/17 0752 02/10/17 1154  GLUCAP 229* 223* 243* 108* 139*    Signed:  Meredeth IdeGagan S Courtland Reas MD.  [facility] 02/10/2017, 12:47 PM

## 2017-02-10 NOTE — Plan of Care (Signed)
  Nutrition: Adequate nutrition will be maintained 02/10/2017 1032 - Progressing by William Daltonarpenter, Nieve Rojero L, RN   Elimination: Will not experience complications related to bowel motility 02/10/2017 1032 - Progressing by William Daltonarpenter, Jaleen Finch L, RN   Pain Managment: General experience of comfort will improve 02/10/2017 1032 - Progressing by William Daltonarpenter, Antwoine Zorn L, RN

## 2017-02-10 NOTE — Clinical Social Work Note (Signed)
Clinical Social Work Assessment  Patient Details  Name: Cynthia [name] MRN: [id number] Date of Birth: 05/23/1927  Date of referral:  02/10/17               Reason for consult:  Facility Placement                Permission sought to share information with:  Oceanographeracility Contact Representative Permission granted to share information::  Yes, Verbal Permission Granted  Name::        Agency::  [facility]  Relationship::     Contact Information:     Housing/Transportation Living arrangements for the past 2 months:  Skilled Nursing Facility Source of Information:  Adult Children(Daughter Cynthia [name]) Patient Interpreter Needed:  None Criminal Activity/Legal Involvement Pertinent to Current Situation/Hospitalization:  No - Comment as needed Significant Relationships:  Adult Children Lives with:  Facility Resident Do you feel safe going back to the place where you live?  Yes Need for family participation in patient care:  Yes (Comment)  Care giving concerns:  Patient Cynthia [name] [facility].   Social Worker assessment / plan:  CSW spoke with patient's daughter regarding dc planning. Patient's daughter reported that the plan is for patient to dc back to current SNF. Patient's daughter reported that patient will need PTAR for transport.  CSW completed FL2 and [facility] who confirmed patient's ability to return.   Employment status:  Retired Health and safety inspectornsurance information:  Medicare PT Recommendations:  Not assessed at this time Information / Referral to community resources:  (From SNF)  Patient/Family's Response to care:  Patient's daughter agreeable to patient returning to SNF.  Patient/Family's Understanding of and Emotional Response to Diagnosis, Current Treatment, and Prognosis:  Patient oriented x person and unable to participate in assessment. Patient's daughter verbalized plan for patient to dc back to current SNF. Patient's daughter expressed  concerns [facility] and reported that she would speak to SNF staff about those concerns. CSW validated patient's concern and encouraged patient's daughter to advocate for patient.   Emotional Assessment Appearance:    Attitude/Demeanor/Rapport:  Unable to Assess Affect (typically observed):  Unable to Assess Orientation:  Oriented to Self Alcohol / Substance use:  Not Applicable Psych involvement (Current and /or in the community):  No (Comment)  Discharge Needs  Concerns to be addressed:  No discharge needs identified Readmission within the last 30 days:  No Current discharge risk:  Dependent with Mobility Barriers to Discharge:  No Barriers Identified   Cynthia PolesKimberly L Daylynn Stumpp, LCSW 02/10/2017, 1:38 PM

## 2017-02-10 NOTE — Consult Note (Signed)
WOC Nurse wound consult note Reason for Consult: Full thickness pressure injury to left lateral malleolus, left dorsal foot and right foot.  Left ischium with unstageable pressure injury as well.  All present on admission Wound type: Unstageable pressure injury Pressure Injury POA: Yes Measurement: Left ischium:  2 cm x 1.4 cm wound bed is peeling epithelium with darkened center. Unable to visualize full depth.  Left lateral malleolus:  1 cm x 1 cm boggy devitalized wound bed. Left dorsal foot:  2 cm x 0.5 cm devitalized tissue to wound bed.  Right lateral foot:  Deep tissue injury:  1.4 cm x 1 cm wound bed is devitalized tissue with maroon periphery Wound ZOX:[id number]:devitalized tissue Drainage (amount, consistency, odor) minimal serosanguinous Periwound:intact Dressing procedure/placement/frequency:Will begin Santyl enzymatic debrider daily.  Will not follow at this time.  Please re-consult if needed.  Maple HudsonKaren Lacara Dunsworth RN BSN CWON Pager [phone]

## 2017-02-11 ENCOUNTER — Non-Acute Institutional Stay (SKILLED_NURSING_FACILITY): Payer: Medicare Other | Admitting: Adult Health

## 2017-02-11 ENCOUNTER — Encounter: Payer: Self-pay | Admitting: Adult Health

## 2017-02-11 DIAGNOSIS — E11621 Type 2 diabetes mellitus with foot ulcer: Secondary | ICD-10-CM

## 2017-02-11 DIAGNOSIS — E1159 Type 2 diabetes mellitus with other circulatory complications: Secondary | ICD-10-CM

## 2017-02-11 DIAGNOSIS — I10 Essential (primary) hypertension: Secondary | ICD-10-CM

## 2017-02-11 DIAGNOSIS — L97401 Non-pressure chronic ulcer of unspecified heel and midfoot limited to breakdown of skin: Secondary | ICD-10-CM

## 2017-02-11 DIAGNOSIS — R1311 Dysphagia, oral phase: Secondary | ICD-10-CM

## 2017-02-11 DIAGNOSIS — E1122 Type 2 diabetes mellitus with diabetic chronic kidney disease: Secondary | ICD-10-CM

## 2017-02-11 DIAGNOSIS — F418 Other specified anxiety disorders: Secondary | ICD-10-CM

## 2017-02-11 DIAGNOSIS — I152 Hypertension secondary to endocrine disorders: Secondary | ICD-10-CM

## 2017-02-11 DIAGNOSIS — N184 Chronic kidney disease, stage 4 (severe): Secondary | ICD-10-CM | POA: Insufficient documentation

## 2017-02-11 DIAGNOSIS — N3000 Acute cystitis without hematuria: Secondary | ICD-10-CM

## 2017-02-11 DIAGNOSIS — E034 Atrophy of thyroid (acquired): Secondary | ICD-10-CM

## 2017-02-11 DIAGNOSIS — K219 Gastro-esophageal reflux disease without esophagitis: Secondary | ICD-10-CM

## 2017-02-11 DIAGNOSIS — E785 Hyperlipidemia, unspecified: Secondary | ICD-10-CM

## 2017-02-11 DIAGNOSIS — R634 Abnormal weight loss: Secondary | ICD-10-CM

## 2017-02-11 DIAGNOSIS — Z794 Long term (current) use of insulin: Secondary | ICD-10-CM

## 2017-02-11 DIAGNOSIS — I499 Cardiac arrhythmia, unspecified: Secondary | ICD-10-CM

## 2017-02-11 DIAGNOSIS — E1169 Type 2 diabetes mellitus with other specified complication: Secondary | ICD-10-CM

## 2017-02-11 NOTE — Progress Notes (Signed)
Location:   [facility] Room Number: 202 B Place of Service:  SNF (31)   CODE STATUS: DNR  No Known Allergies  Chief Complaint  Patient presents with  . Hospitalization Follow-up    Hospital Follow up    HPI:  She is a 83 year old long term resident of this facility being seen after being hospitalized for an an acute uti; hyperglycemia with diabetes; acute renal failure and multiple bilateral lower extremity foot ulcerations. She was given IVF for her hypernatremia which as resolved to 143.  She will be followed for her chronic illnesses including: diabetes; hypertension; stage iv ckd.   Past Medical History:  Diagnosis Date  . CKD (chronic kidney disease)   . Dementia   . Diabetes mellitus without complication (HCC)   . Hypertension   . Hypothyroid   . Hypothyroid   . Thyroid disease     Past Surgical History:  Procedure Laterality Date  . ABDOMINAL HYSTERECTOMY    . FRACTURE SURGERY    . INCISION AND DRAINAGE ABSCESS Left 10/22/2016   Procedure: INCISION AND DRAINAGE LEFT AXILLARY  ABSCESS;  Surgeon: Glenna FellowsHoxworth, Benjamin, MD;  Location: [facility];  Service: General;  Laterality: Left;    Social History   Socioeconomic History  . Marital status: Widowed    Spouse name: Not on file  . Number of children: Not on file  . Years of education: Not on file  . Highest education level: Not on file  Social Needs  . Financial resource strain: Not on file  . Food insecurity - worry: Not on file  . Food insecurity - inability: Not on file  . Transportation needs - medical: Not on file  . Transportation needs - non-medical: Not on file  Occupational History  . Not on file  Tobacco Use  . Smoking status: Never Smoker  . Smokeless tobacco: Never Used  Substance and Sexual Activity  . Alcohol use: No  . Drug use: No  . Sexual activity: Not Currently  Other Topics Concern  . Not on file  Social History Narrative  . Not on file   History reviewed. No pertinent  family history.    VITAL SIGNS BP 120/62   Pulse 80   Temp 99.1 F (37.3 C)   Resp 16   Ht 5\' 3"  (1.6 m)   Wt 151 lb (68.5 kg)   SpO2 96%   BMI 26.75 kg/m   Outpatient Encounter Medications as of 02/11/2017  Medication Sig  . acetaminophen (TYLENOL) 500 MG tablet Take 500 mg by mouth 3 (three) times daily.  Marland Kitchen. amLODipine (NORVASC) 10 MG tablet Take 10 mg by mouth daily.  . cephALEXin (KEFLEX) 500 MG capsule Take 1 capsule (500 mg total) by mouth 2 (two) times daily for 5 days.  . collagenase (SANTYL) ointment Apply topically daily. Apply to wounds on both feet and left ischium  . insulin aspart (NOVOLOG) 100 UNIT/ML injection Inject 0-8 Units into the skin 3 (three) times daily before meals. Inject as per sliding scale subcutaneously before meals: 0 - 149 = 0 units 150 - 450 = 8 units Notify MD if less than 70 or greater than 450  . insulin detemir (LEVEMIR) 100 UNIT/ML injection Inject 15 Units into the skin at bedtime.   Marland Kitchen. levothyroxine (SYNTHROID, LEVOTHROID) 137 MCG tablet Take 137 mcg by mouth daily.   Marland Kitchen. loperamide (IMODIUM A-D) 2 MG tablet Take 2 mg by mouth every 8 (eight) hours as needed for diarrhea or  loose stools.  Marland Kitchen. LORazepam (ATIVAN) 0.5 MG tablet Take 1 tablet (0.5 mg total) by mouth daily.  . magnesium hydroxide (MILK OF MAGNESIA) 400 MG/5ML suspension Take 30 mLs by mouth daily as needed for mild constipation.   . memantine (NAMENDA) 10 MG tablet Take 10 mg by mouth 2 (two) times daily.  . Multiple Vitamins-Minerals (DECUBI-VITE) CAPS Take 1 capsule by mouth daily.  . Nutritional Supplements (NUTRITIONAL SUPPLEMENT PO) Take 120 mLs by mouth daily. *House Supplement*  . omeprazole (PRILOSEC) 20 MG capsule Take 20 mg by mouth daily.  . ondansetron (ZOFRAN-ODT) 8 MG disintegrating tablet Take 8 mg by mouth every 8 (eight) hours as needed for nausea or vomiting.  . Probiotic Product (PROBIOTIC DAILY) CAPS Take 1 capsule by mouth 2 (two) times daily.  . QUEtiapine  (SEROQUEL) 50 MG tablet Take 25-50 mg by mouth 2 (two) times daily. Take 25 mg in the morning and 50 mg at bedtime  . sertraline (ZOLOFT) 100 MG tablet Take 100 mg by mouth daily.  Marland Kitchen. UNABLE TO FIND HSG CCD Diet - HSG Puree texture, regular consistency  . vitamin B-12 (CYANOCOBALAMIN) 1000 MCG tablet Take 1,000 mcg by mouth daily.  Carlena Hurl. XARELTO 20 MG TABS tablet Take 20 mg by mouth daily.   No facility-administered encounter medications on file as of 02/11/2017.      SIGNIFICANT DIAGNOSTIC EXAMS  TODAY:   02-07-17: chest x-ray: No acute cardiopulmonary findings.    LABS REVIEWED: PREVIOUS    03-01-16: wbc 5.3; hgb 13.1; hct 40.4; mcv 88.8; plt 228; glucose 211; bun 29; creat 1.84; k+ 4.3; na++ 138; tsh 30.991  04-15-16: wbc 4.9; hgb 12.7; hct 40.1; mcv 92.3; plt 200; glucose 169; bun 23.6; creat 1.21; k+ 4.4; na++ 141; liver normal albumin 3.8; hgb a1c 8.2 04-16-16: glucose 171; bun 19.3; creat 1.35; k+ 4.8; na++ 140; liver normal albumin 3.6; chol 190; ldl 112; trig 109; hdl 56 tsh 15.82; free T4: 0.63; hgb a1c 8.0  05-15-16: tsh 71.63 free T 4: 0.33 06-18-16: tsh 4.63; free T 4: 1.20 free T 3: 1.7 08-20-16: glucose 116; bun 41.9; creat 1.50; k+ 4.4; na++ 145; ca 9.1 tsh 2.06 ast 11; hgb a1c 7.3; chol 142; ldl 63; trig 83; hdl 62; urine micro-albumin 1.6  10-21-16: wbc 6.1; hgb 10.1; hct 31.8; mcv 89.3; plt 296; glucose 245; bun 48; creat 1.5; k+ 5.0 na++ 150; ca 9.4 10-22-16; wbc 5.8; hgb 9.7; hct 30.8; mcv 89.8; plt 280; glucose 205; bun 36; creat 1.32; k+ 4.6; na++ 149; liver normal albumin 3.1 11-26-16: stool for c-diff: +  11-29-16: Wbc 6.3; hgb 9.5; hct 30.0; mcv 94.7; plt 287; glucose 72; bun 39.9; creat 1.56; k+ 4.9; na++ 142; ca 8.8; liver normal albumin 3.4  TODAY:   02-05-17; hgb a1c 11.7; chol 165; ldl 91; trig 171; hdl 39 02-07-17: wbc 11.8; hgb 11.8; hct 38.4; mcv 92.1; plt 293; glucose 335; bun 45; creat 1.57; k+ 4.9; na++ 156; ca 9.0; liver normal albumin 2.6; mag 2.1; hgb a1c 11.4;  urine culture: e-coli: keflex 02-09-17: wbc 7.9; hgb 10.2; ht 34.0; mcv 92.6; plt 244; glucose 208; bun 32; creat 1.30; k+ 3.4; na++ 152; ca 8.6 12--3-18: wbc 7.0; hgb 9.9; ht 32.9; mcv 91.4; plt 215; glucose 178; bun 24; creat 1.18; k+ 3.9; na++ 143; ca 8.1    Review of Systems  Unable to perform ROS: Dementia (nonverbal )    Physical Exam  Constitutional: No distress.  Frail   Neck: Neck supple.  No thyromegaly present.  Cardiovascular: Normal rate, regular rhythm and intact distal pulses.  Murmur heard. Pulmonary/Chest: Effort normal and breath sounds normal. No respiratory distress.  Abdominal: Soft. Bowel sounds are normal. She exhibits no distension. There is no tenderness.  Musculoskeletal: She exhibits edema.  Bilateral pedal edema: trace Left hemiplegia  Lymphadenopathy:    She has no cervical adenopathy.  Neurological: She is alert.  Skin: Skin is warm and dry. She is not diaphoretic.  Left lateral hip stage II: 4.0 x 2.9 x NM cm  Right lateral ankle stage III: 1.8 x 1.7 x 0.1 cm Left lateral ankle stage II: 2.3 x 1.8 x  NM cm Right distal medial foot unstaged DTI: 2.5 x 3.4 NM cm  Right medial foot unstaged: 1.8 x 1.7 x NM cm  Left medial shin: unstaged (necrosis) 4.4 x 1.5 x NM cm (debrided) Left medial foot distal unstaged DTI: 1.7 x 1.4 x NM cm  Feet discolored    ASSESSMENT/ PLAN:  TODAY:   1. Hypertension associated with diabetes: stable b/p 120/62: will continue norvasc 10 mg daily   2. Dysphagia, oral phase: stable no signs of aspiration present is on thin liquids with pureed diet will not make changes   3.  GERD without esophagitis: stable will continue prilosec 20 mg daily   4. Hypothyroidism due to acquired atrophy: stable tsh 4.63; will continue synthroid 137 mcg daily   5. Dyslipidemia associated with type 2 diabetes; stable ldl 112 is not on statin at this time; she has been experiencing weight loss  6. 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 Vascular dementia with behavioral disturbances: without change will continue namenda 10 mg twice daily   8. Depression with anxiety: is emotionally stable; is presently taking ativan 0.5 mg daily to help with anxiety for AM care; GDR: will lower her zoloft to 75 mg daily and will continue to monitor   9.  Diabetic foot ulcerations: has some component of pressure as well: without change; is being followed by the wound dr. Albumin 2.6  Will not make changes and will monitor   10. Cardiac arrhythmia: has a history of left arm dvt: heart rate is stable; will continue xarelto 20 mg daily will monitor   11. UTI: e-coli: will complete keflex and will monitor   12. Weight loss: 180 days ago weight was 169 pounds; 02-08-17: weight 151 pounds: will be placed on fortified food; and house supplement; will begin remeron 7.5 mg nightly for 30 days and will monitor   Will get bilateral lower extremity venous and arterial doppler Will check cbc; bmp; pre-albumin     MD is aware of resident's narcotic use and is in agreement with current plan of care. We will attempt to wean resident as apropriate     Synthia Innocent NP [facility]  Contact [phone] Monday through Friday 8am- 5pm  After hours call [phone]

## 2017-02-12 ENCOUNTER — Telehealth: Payer: Self-pay

## 2017-02-12 LAB — CBC AND DIFFERENTIAL
HCT: 34 — AB (ref 36–46)
Hemoglobin: 10.9 — AB (ref 12.0–16.0)
PLATELETS: 209 (ref 150–399)
WBC: 6.7

## 2017-02-12 LAB — BASIC METABOLIC PANEL
BUN: 17 (ref 4–21)
CREATININE: 1 (ref 0.5–1.1)
GLUCOSE: 344
Potassium: 4.7 (ref 3.4–5.3)
Sodium: 139 (ref 137–147)

## 2017-02-12 NOTE — Telephone Encounter (Signed)
Possible re-admission to facility. [facility] is needed if patient was re-admitted to facility upon discharge. [facility]

## 2017-02-13 ENCOUNTER — Encounter: Payer: Self-pay | Admitting: Internal Medicine

## 2017-02-13 ENCOUNTER — Non-Acute Institutional Stay (SKILLED_NURSING_FACILITY): Payer: Medicare Other | Admitting: Internal Medicine

## 2017-02-13 DIAGNOSIS — L97401 Non-pressure chronic ulcer of unspecified heel and midfoot limited to breakdown of skin: Secondary | ICD-10-CM

## 2017-02-13 DIAGNOSIS — F0151 Vascular dementia with behavioral disturbance: Secondary | ICD-10-CM

## 2017-02-13 DIAGNOSIS — N390 Urinary tract infection, site not specified: Secondary | ICD-10-CM | POA: Diagnosis not present

## 2017-02-13 DIAGNOSIS — E1159 Type 2 diabetes mellitus with other circulatory complications: Secondary | ICD-10-CM | POA: Diagnosis not present

## 2017-02-13 DIAGNOSIS — E11621 Type 2 diabetes mellitus with foot ulcer: Secondary | ICD-10-CM | POA: Diagnosis not present

## 2017-02-13 DIAGNOSIS — F01518 Vascular dementia, unspecified severity, with other behavioral disturbance: Secondary | ICD-10-CM

## 2017-02-13 DIAGNOSIS — E034 Atrophy of thyroid (acquired): Secondary | ICD-10-CM | POA: Diagnosis not present

## 2017-02-13 DIAGNOSIS — R627 Adult failure to thrive: Secondary | ICD-10-CM | POA: Diagnosis not present

## 2017-02-13 DIAGNOSIS — I10 Essential (primary) hypertension: Secondary | ICD-10-CM

## 2017-02-13 DIAGNOSIS — I82401 Acute embolism and thrombosis of unspecified deep veins of right lower extremity: Secondary | ICD-10-CM | POA: Diagnosis not present

## 2017-02-13 DIAGNOSIS — F418 Other specified anxiety disorders: Secondary | ICD-10-CM

## 2017-02-13 NOTE — Progress Notes (Deleted)
Provider:  DR Elmon Kirschner Location:  [facility] Room Number: 202 B Place of Service:  SNF (31)  PCP: Kirt Boys, DO Patient Care Team: Kirt Boys, DO as PCP - General (Internal Medicine)  Extended Emergency Contact Information Primary Emergency Contact: Elmore,[name] Address: [address]  [place] Home Phone: [phone] Relation: Daughter  Code Status: *** Goals of Care: Advanced Directive information Advanced Directives 02/13/2017  Does Patient Have a Medical Advance Directive? Yes  Type of Advance Directive Out of facility DNR (pink MOST or yellow form)  Does patient want to make changes to medical advance directive? No - Patient declined  Copy of Healthcare Power of Attorney in Chart? -  Would patient like information on creating a medical advance directive? -  Pre-existing out of facility DNR order (yellow form or pink MOST form) Yellow form placed in chart (order not valid for inpatient use)      Chief Complaint  Patient presents with  . Readmit To SNF    Readmit to [facility]    HPI: Patient is a 83 y.o. female seen today for admission to  Past Medical History:  Diagnosis Date  . CKD (chronic kidney disease)   . Dementia   . Diabetes mellitus without complication (HCC)   . Hypertension   . Hypothyroid   . Hypothyroid   . Thyroid disease    Past Surgical History:  Procedure Laterality Date  . ABDOMINAL HYSTERECTOMY    . FRACTURE SURGERY    . INCISION AND DRAINAGE ABSCESS Left 10/22/2016   Procedure: INCISION AND DRAINAGE LEFT AXILLARY  ABSCESS;  Surgeon: Glenna Fellows, MD;  Location: [facility];  Service: General;  Laterality: Left;    reports that  has never smoked. she has never used smokeless tobacco. She reports that she does not drink alcohol or use drugs. Social History   Socioeconomic History  . Marital status: Widowed    Spouse name: Not on file  . Number of children: Not on  file  . Years of education: Not on file  . Highest education level: Not on file  Social Needs  . Financial resource strain: Not on file  . Food insecurity - worry: Not on file  . Food insecurity - inability: Not on file  . Transportation needs - medical: Not on file  . Transportation needs - non-medical: Not on file  Occupational History  . Not on file  Tobacco Use  . Smoking status: Never Smoker  . Smokeless tobacco: Never Used  Substance and Sexual Activity  . Alcohol use: No  . Drug use: No  . Sexual activity: Not Currently  Other Topics Concern  . Not on file  Social History Narrative  . Not on file    Functional Status Survey:    History reviewed. No pertinent family history.  Health Maintenance  Topic Date Due  . INFLUENZA VACCINE  05/21/2017 (Originally 10/09/2016)  . OPHTHALMOLOGY EXAM  05/21/2017 (Originally 04/13/1937)  . DEXA SCAN  05/21/2017 (Originally 04/12/1994)  . TETANUS/TDAP  05/21/2017 (Originally 04/12/1948)  . PNA vac Low Risk Adult (1 of 2 - PCV13) 05/21/2017 (Originally 04/12/1994)  . HEMOGLOBIN A1C  08/07/2017  . URINE MICROALBUMIN  08/20/2017  . FOOT EXAM  11/28/2017    No Known Allergies  Outpatient Encounter Medications as of 02/13/2017  Medication Sig  . acetaminophen (TYLENOL) 500 MG tablet Take 500 mg by mouth 3 (three) times daily.  Marland Kitchen amLODipine (NORVASC) 10 MG tablet Take 10  mg by mouth daily.  . cephALEXin (KEFLEX) 500 MG capsule Take 1 capsule (500 mg total) by mouth 2 (two) times daily for 5 days.  . collagenase (SANTYL) ointment Apply topically daily. Apply to wounds on both feet and left ischium  . insulin aspart (NOVOLOG) 100 UNIT/ML injection Inject 0-8 Units into the skin 3 (three) times daily before meals. Inject as per sliding scale subcutaneously before meals: 0 - 149 = 0 units 150 - 450 = 8 units Notify MD if less than 70 or greater than 450  . insulin detemir (LEVEMIR) 100 UNIT/ML injection Inject 15 Units into the skin at  bedtime.   Marland Kitchen. levothyroxine (SYNTHROID, LEVOTHROID) 137 MCG tablet Take 137 mcg by mouth daily.   Marland Kitchen. loperamide (IMODIUM A-D) 2 MG tablet Take 2 mg by mouth every 8 (eight) hours as needed for diarrhea or loose stools.  Marland Kitchen. LORazepam (ATIVAN) 0.5 MG tablet Take 1 tablet (0.5 mg total) by mouth daily.  . magnesium hydroxide (MILK OF MAGNESIA) 400 MG/5ML suspension Take 30 mLs by mouth daily as needed for mild constipation.   . memantine (NAMENDA) 10 MG tablet Take 10 mg by mouth 2 (two) times daily.  . mirtazapine (REMERON) 15 MG tablet Take 7.5 mg by mouth at bedtime.  . Multiple Vitamins-Minerals (DECUBI-VITE) CAPS Take 1 capsule by mouth daily.  . Nutritional Supplements (NUTRITIONAL SUPPLEMENT PO) Take 120 mLs by mouth daily. *House Supplement*  . omeprazole (PRILOSEC) 20 MG capsule Take 20 mg by mouth daily.  . ondansetron (ZOFRAN-ODT) 8 MG disintegrating tablet Take 8 mg by mouth every 8 (eight) hours as needed for nausea or vomiting.  . Probiotic Product (PROBIOTIC DAILY) CAPS Take 1 capsule by mouth 2 (two) times daily.  . QUEtiapine (SEROQUEL) 50 MG tablet Take 25 mg in the morning and 50 mg at bedtime  . sertraline (ZOLOFT) 50 MG tablet Take 50 mg by mouth daily.   Marland Kitchen. UNABLE TO FIND HSG CCD Diet - HSG Puree texture, regular consistency  . vitamin B-12 (CYANOCOBALAMIN) 1000 MCG tablet Take 1,000 mcg by mouth daily.  Carlena Hurl. XARELTO 20 MG TABS tablet Take 20 mg by mouth daily.   No facility-administered encounter medications on file as of 02/13/2017.     Review of Systems  Vitals:   02/13/17 1047  BP: 120/62  Pulse: 80  Resp: 16  Temp: 99.1 F (37.3 C)  TempSrc: Oral  SpO2: 96%  Weight: 151 lb (68.5 kg)  Height: 5\' 3"  (1.6 m)   Body mass index is 26.75 kg/m. Physical Exam  Labs reviewed: Basic Metabolic Panel: Recent Labs    02/07/17 1901 02/08/17 0513 02/09/17 0456 02/10/17 0421 02/12/17  NA  --  155* 152* 143 139  K  --  4.6 3.4* 3.9 4.7  CL  --  123* 122* 117*  --     CO2  --  23 24 21*  --   GLUCOSE  --  432* 208* 178*  --   BUN  --  44* 32* 24* 17  CREATININE  --  1.41* 1.30* 1.18* 1.0  CALCIUM  --  8.8* 8.6* 8.1*  --   MG 2.1  --   --   --   --    Liver Function Tests: Recent Labs    10/22/16 0437 10/24/16 02/07/17 1203 02/08/17 0513  AST 14* 12* 29 17  ALT 16 11 16 18   ALKPHOS 88 101 77 79  BILITOT 0.5  --  1.2 0.6  PROT 6.8  --  6.6 6.7  ALBUMIN 3.1*  --  2.6* 2.6*   No results for input(s): LIPASE, AMYLASE in the last 8760 hours. No results for input(s): AMMONIA in the last 8760 hours. CBC: Recent Labs    10/21/16 1345  10/24/16 02/07/17 1203 02/08/17 0513 02/09/17 0456 02/10/17 0421 02/12/17  WBC 6.1   < > 6.3 11.8* 10.0 7.9 7.0 6.7  NEUTROABS 4.2  --  5 9.3*  --   --   --   --   HGB 10.1*   < > 9.5* 11.8* 11.5* 10.2* 9.9* 10.9*  HCT 31.8*   < > 30* 38.4 38.7 34.0* 32.9* 34*  MCV 89.3   < >  --  92.1 93.3 92.6 91.4  --   PLT 296   < > 287 293 282 244 215 209   < > = values in this interval not displayed.   Cardiac Enzymes: No results for input(s): CKTOTAL, CKMB, CKMBINDEX, TROPONINI in the last 8760 hours. BNP: Invalid input(s): POCBNP Lab Results  Component Value Date   HGBA1C 11.4 (H) 02/07/2017   Lab Results  Component Value Date   TSH 2.06 08/20/2016   No results found for: VITAMINB12 No results found for: FOLATE No results found for: IRON, TIBC, FERRITIN  Imaging and Procedures obtained prior to SNF admission: Dg Chest 2 View  Result Date: 02/07/2017 CLINICAL DATA:  Lethargic and week. EXAM: CHEST  2 VIEW COMPARISON:  03/01/2016 FINDINGS: Knee heart is mildly enlarged but stable. There is moderate tortuosity of the thoracic aorta. The pulmonary hila appear normal. Low lung volumes with vascular crowding and streaky basilar atelectasis but no infiltrates, edema or effusions. The bony thorax is intact. Remote T12 compression fracture is noted. Advanced degenerative changes involving both shoulders and probable  bilateral rotator cuff disease. IMPRESSION: No acute cardiopulmonary findings. Electronically Signed   By: Rudie MeyerP.  Gallerani M.D.   On: 02/07/2017 12:31    Assessment/Plan    Family/ staff Communication:   Labs/tests ordered:    Bertis RuddyMonica S. Ancil Linseyarter, D. O., F. A. C. O. I.  [facility] [address] [place], [place] [address] [phone] Cell (Monday-Friday 8 AM - 5 PM) [phone] After 5 PM and follow prompts

## 2017-02-13 NOTE — Progress Notes (Signed)
Patient ID: Cynthia [name], female   DOB: 07/25/1927, 83 y.o.   MRN: [id number]    Provider:  DR Elmon KirschnerMONICA S Jadea Shiffer Location:  [facility] Room Number: 202 B Place of Service:  SNF (31)  PCP: Kirt Boysarter, Haaris Metallo, DO Patient Care Team: Kirt Boysarter, Mekala Winger, DO as PCP - General (Internal Medicine)  Extended Emergency Contact Information Primary Emergency Contact: Elmore,[name] [id number] [facility] [address]  [place] Home Phone: [phone] Relation: Daughter  Code Status: DNR Goals of Care: Advanced Directive information Advanced Directives 02/13/2017  Does Patient Have a Medical Advance Directive? Yes  Type of Advance Directive Out of facility DNR (pink MOST or yellow form)  Does patient want to make changes to medical advance directive? No - Patient declined  Copy of Healthcare Power of Attorney in Chart? -  Would patient like information on creating a medical advance directive? -  Pre-existing out of facility DNR order (yellow form or pink MOST form) Yellow form placed in chart (order not valid for inpatient use)      Chief Complaint  Patient presents with  . Readmit To SNF    Readmit to [facility]    HPI: Patient is a long term 83 y.o. female resident seen today for readmission into SNF following hospital stay for UTI, hyperglycemia with DM, pressure injury of skin/leg ulcers, hypernatremia, AKI, hypothyroidism, HTN, recurrent DVTs, dementia, prolonged QTc. She presented to the ED with elevated BS (400-550s) and urine cx (+) E coli. She was tx with IV rocephin -->po keflex. Wound care consulted. Na 156-->143; Cr 1.57-->1.18; albumin 2.6 at d/c. She presents to SNF to continue long term care.  Today she reports no concerns. Poor po intake. Sleeps well. No falls. Facility vascular US (+) right LE DVT. No other nursing issues. She is a poor historian due to dementia. Hx obtained from chart.  Hypertension  - stable on norvasc 10 mg daily    Dysphagia, oral phase - stable with no signs of aspiration present; diet includes thin liquids with pureed  GERD without esophagitis - stable on prilosec 20 mg daily   Hypothyroidism due to acquired atrophy - stable on synthroid 137 mcg daily. TSH 2.06  Dyslipidemia  - off statin 2/2 wt loss. LDL 91; TG 171  DM - uncontrolled. A1c 11.7%. Takes levemir 15 units nightly and novolog 8 units with meals. Fasting serum glucose 344  Vascular dementia with behavioral disturbances/FTT/severe protein calorie malnutrition - cognition unchanged. Takes namenda 10 mg twice daily. Albumin 2.6 with prealbumin 12. Weight down 8 lbs since Nov 2018. She gets nutritional supplements per facility protocol. She is on a remeron trial. She is bed bound  Depression with anxiety - mood stable on ativan 0.5 mg daily. She is on GDR of zoloft and now takes 75 mg daily  Diabetic foot ulcerations + pressure injury  - unchanged. Followed by the facility wound provider. Albumin 2.6 with prealbumin 12  Cardiac arrhythmia - rate controlled. Takes xarelto 20 mg daily for anticoagulation  Past Medical History:  Diagnosis Date  . CKD (chronic kidney disease)   . Dementia   . Diabetes mellitus without complication (HCC)   . Hypertension   . Hypothyroid   . Hypothyroid   . Thyroid disease    Past Surgical History:  Procedure Laterality Date  . ABDOMINAL HYSTERECTOMY    . FRACTURE SURGERY    . INCISION AND DRAINAGE ABSCESS Left 10/22/2016   Procedure: INCISION AND DRAINAGE LEFT AXILLARY  ABSCESS;  Surgeon:  Glenna FellowsHoxworth, Benjamin, MD;  Location: [facility];  Service: General;  Laterality: Left;    reports that  has never smoked. she has never used smokeless tobacco. She reports that she does not drink alcohol or use drugs. Social History   Socioeconomic History  . Marital status: Widowed    Spouse name: Not on file  . Number of children: Not on file  . Years of education: Not on file  . Highest education level: Not on  file  Social Needs  . Financial resource strain: Not on file  . Food insecurity - worry: Not on file  . Food insecurity - inability: Not on file  . Transportation needs - medical: Not on file  . Transportation needs - non-medical: Not on file  Occupational History  . Not on file  Tobacco Use  . Smoking status: Never Smoker  . Smokeless tobacco: Never Used  Substance and Sexual Activity  . Alcohol use: No  . Drug use: No  . Sexual activity: Not Currently  Other Topics Concern  . Not on file  Social History Narrative  . Not on file    Functional Status Survey:    History reviewed. No pertinent family history.  Health Maintenance  Topic Date Due  . INFLUENZA VACCINE  05/21/2017 (Originally 10/09/2016)  . OPHTHALMOLOGY EXAM  05/21/2017 (Originally 04/13/1937)  . DEXA SCAN  05/21/2017 (Originally 04/12/1994)  . TETANUS/TDAP  05/21/2017 (Originally 04/12/1948)  . PNA vac Low Risk Adult (1 of 2 - PCV13) 05/21/2017 (Originally 04/12/1994)  . HEMOGLOBIN A1C  08/07/2017  . URINE MICROALBUMIN  08/20/2017  . FOOT EXAM  11/28/2017    No Known Allergies  Outpatient Encounter Medications as of 02/13/2017  Medication Sig  . acetaminophen (TYLENOL) 500 MG tablet Take 500 mg by mouth 3 (three) times daily.  Marland Kitchen. amLODipine (NORVASC) 10 MG tablet Take 10 mg by mouth daily.  . cephALEXin (KEFLEX) 500 MG capsule Take 1 capsule (500 mg total) by mouth 2 (two) times daily for 5 days.  . collagenase (SANTYL) ointment Apply topically daily. Apply to wounds on both feet and left ischium  . insulin aspart (NOVOLOG) 100 UNIT/ML injection Inject 0-8 Units into the skin 3 (three) times daily before meals. Inject as per sliding scale subcutaneously before meals: 0 - 149 = 0 units 150 - 450 = 8 units Notify MD if less than 70 or greater than 450  . insulin detemir (LEVEMIR) 100 UNIT/ML injection Inject 15 Units into the skin at bedtime.   Marland Kitchen. levothyroxine (SYNTHROID, LEVOTHROID) 137 MCG tablet Take 137 mcg by  mouth daily.   Marland Kitchen. loperamide (IMODIUM A-D) 2 MG tablet Take 2 mg by mouth every 8 (eight) hours as needed for diarrhea or loose stools.  Marland Kitchen. LORazepam (ATIVAN) 0.5 MG tablet Take 1 tablet (0.5 mg total) by mouth daily.  . magnesium hydroxide (MILK OF MAGNESIA) 400 MG/5ML suspension Take 30 mLs by mouth daily as needed for mild constipation.   . memantine (NAMENDA) 10 MG tablet Take 10 mg by mouth 2 (two) times daily.  . mirtazapine (REMERON) 15 MG tablet Take 7.5 mg by mouth at bedtime.  . Multiple Vitamins-Minerals (DECUBI-VITE) CAPS Take 1 capsule by mouth daily.  . Nutritional Supplements (NUTRITIONAL SUPPLEMENT PO) Take 120 mLs by mouth daily. *House Supplement*  . omeprazole (PRILOSEC) 20 MG capsule Take 20 mg by mouth daily.  . ondansetron (ZOFRAN-ODT) 8 MG disintegrating tablet Take 8 mg by mouth every 8 (eight) hours as needed for nausea or vomiting.  .Marland Kitchen  Probiotic Product (PROBIOTIC DAILY) CAPS Take 1 capsule by mouth 2 (two) times daily.  . QUEtiapine (SEROQUEL) 50 MG tablet Take 25 mg in the morning and 50 mg at bedtime  . sertraline (ZOLOFT) 50 MG tablet Take 50 mg by mouth daily.   Marland Kitchen UNABLE TO FIND HSG CCD Diet - HSG Puree texture, regular consistency  . vitamin B-12 (CYANOCOBALAMIN) 1000 MCG tablet Take 1,000 mcg by mouth daily.  Carlena Hurl 20 MG TABS tablet Take 20 mg by mouth daily.   No facility-administered encounter medications on file as of 02/13/2017.     Review of Systems  Unable to perform ROS: Dementia    Vitals:   02/13/17 1047  BP: 120/62  Pulse: 80  Resp: 16  Temp: 99.1 F (37.3 C)  TempSrc: Oral  SpO2: 96%  Weight: 151 lb (68.5 kg)  Height: 5\' 3"  (1.6 m)   Body mass index is 26.75 kg/m. Physical Exam  Constitutional: She appears well-developed.  Frail appearing lying in bed in NAD  HENT:  Mouth/Throat: Oropharynx is clear and moist. No oropharyngeal exudate.  MMM; no oral thrush  Eyes: Pupils are equal, round, and reactive to light. No scleral icterus.   Neck: Neck supple. Carotid bruit is not present. No tracheal deviation present.  Cardiovascular: Normal rate, regular rhythm and intact distal pulses. Exam reveals no gallop and no friction rub.  Murmur (1/6 SEM) heard. Right leg swelling with calf TTP. No LLE edema/calf TTP  Pulmonary/Chest: Effort normal and breath sounds normal. No stridor. No respiratory distress. She has no wheezes. She has no rales.  Abdominal: Soft. Normal appearance and bowel sounds are normal. She exhibits no distension and no mass. There is no hepatomegaly. There is no tenderness. There is no rigidity, no rebound and no guarding. No hernia.  Musculoskeletal: She exhibits edema.  Lymphadenopathy:    She has no cervical adenopathy.  Neurological: She is alert.  Skin: Skin is warm and dry. No rash noted.  Right heel wound present; b/l LE followed by wound care  Psychiatric: She has a normal mood and affect. Her behavior is normal.    Labs reviewed: Basic Metabolic Panel: Recent Labs    02/07/17 1901 02/08/17 0513 02/09/17 0456 02/10/17 0421 02/12/17  NA  --  155* 152* 143 139  K  --  4.6 3.4* 3.9 4.7  CL  --  123* 122* 117*  --   CO2  --  23 24 21*  --   GLUCOSE  --  432* 208* 178*  --   BUN  --  44* 32* 24* 17  CREATININE  --  1.41* 1.30* 1.18* 1.0  CALCIUM  --  8.8* 8.6* 8.1*  --   MG 2.1  --   --   --   --    Liver Function Tests: Recent Labs    10/22/16 0437 10/24/16 02/07/17 1203 02/08/17 0513  AST 14* 12* 29 17  ALT 16 11 16 18   ALKPHOS 88 101 77 79  BILITOT 0.5  --  1.2 0.6  PROT 6.8  --  6.6 6.7  ALBUMIN 3.1*  --  2.6* 2.6*   No results for input(s): LIPASE, AMYLASE in the last 8760 hours. No results for input(s): AMMONIA in the last 8760 hours. CBC: Recent Labs    10/21/16 1345  10/24/16 02/07/17 1203 02/08/17 0513 02/09/17 0456 02/10/17 0421 02/12/17  WBC 6.1   < > 6.3 11.8* 10.0 7.9 7.0 6.7  NEUTROABS 4.2  --  5 9.3*  --   --   --   --   HGB 10.1*   < > 9.5* 11.8* 11.5*  10.2* 9.9* 10.9*  HCT 31.8*   < > 30* 38.4 38.7 34.0* 32.9* 34*  MCV 89.3   < >  --  92.1 93.3 92.6 91.4  --   PLT 296   < > 287 293 282 244 215 209   < > = values in this interval not displayed.   Cardiac Enzymes: No results for input(s): CKTOTAL, CKMB, CKMBINDEX, TROPONINI in the last 8760 hours. BNP: Invalid input(s): POCBNP Lab Results  Component Value Date   HGBA1C 11.4 (H) 02/07/2017   Lab Results  Component Value Date   TSH 2.06 08/20/2016   No results found for: VITAMINB12 No results found for: FOLATE No results found for: IRON, TIBC, FERRITIN  Imaging and Procedures obtained prior to SNF admission: Dg Chest 2 View  Result Date: 02/07/2017 CLINICAL DATA:  Lethargic and week. EXAM: CHEST  2 VIEW COMPARISON:  03/01/2016 FINDINGS: Knee [name] is mildly enlarged but stable. There is moderate tortuosity of the thoracic aorta. The pulmonary hila appear normal. Low lung volumes with vascular crowding and streaky basilar atelectasis but no infiltrates, edema or effusions. The bony thorax is intact. Remote T12 compression fracture is noted. Advanced degenerative changes involving both shoulders and probable bilateral rotator cuff disease. IMPRESSION: No acute cardiopulmonary findings. Electronically Signed   By: Rudie Meyer M.D.   On: 02/07/2017 12:31    Assessment/Plan   ICD-10-CM   1. Acute deep vein thrombosis (DVT) of right lower extremity, unspecified vein (HCC) - NEW I82.401   2. FTT (failure to thrive) in adult R62.7   3. Diabetic ulcer of midfoot associated with type 2 diabetes mellitus, limited to breakdown of skin, unspecified laterality (HCC) E11.621    L97.401   4. Hypertension associated with diabetes (HCC) E11.59    I10   5. Hypothyroidism due to acquired atrophy of thyroid E03.4   6. Vascular dementia with behavior disturbance F01.51   7. Depression with anxiety F41.8   8. Urinary tract infection without hematuria, site unspecified N39.0    Cont keflex BID x 5  days for UTI  Schedule care plan meeting in light of new DVT. Prognosis is poor. Needs palliative care eval for goals of care. She developed a DVT despite taking xeralto  Nutritional supplements as ordered  Cont current meds as ordered. She is taking xeralto for anticoagulation  F/u with specialists as scheduled  GOAL - long term care. Prognosis poor. Communicated with pt and nursing.  Will follow  Family/ staff Communication:  Care plan meeting    [place] S. Ancil Linsey  [facility] [address] [place], [place] [address] [phone] Cell (Monday-Friday 8 AM - 5 PM) [phone] After 5 PM and follow prompts

## 2017-02-14 ENCOUNTER — Non-Acute Institutional Stay (SKILLED_NURSING_FACILITY): Payer: Medicare Other | Admitting: Adult Health

## 2017-02-14 ENCOUNTER — Encounter: Payer: Self-pay | Admitting: Adult Health

## 2017-02-14 DIAGNOSIS — F0151 Vascular dementia with behavioral disturbance: Secondary | ICD-10-CM | POA: Diagnosis not present

## 2017-02-14 DIAGNOSIS — I824Z1 Acute embolism and thrombosis of unspecified deep veins of right distal lower extremity: Secondary | ICD-10-CM | POA: Diagnosis not present

## 2017-02-14 DIAGNOSIS — F01518 Vascular dementia, unspecified severity, with other behavioral disturbance: Secondary | ICD-10-CM

## 2017-02-14 DIAGNOSIS — R627 Adult failure to thrive: Secondary | ICD-10-CM

## 2017-02-14 NOTE — Progress Notes (Signed)
Location:   [facility] Room Number: 342 A JGOTL of Service:  SNF (31)   CODE STATUS: DNR  No Known Allergies  Chief Complaint  Patient presents with  . Acute Visit    Care Plan meeting    HPI:  I have met with her daughter and nursing supervisor to discuss her overall status. She is on chronic xarelto therapy for her cardiac arrhythmia. She is now positive for dvt right lower extremity. She has failed anticoagulation therapy. As I have discussed with D.r Eulas Post; the options are the following: keep with the same regimen and add asa; begin coumadin and insert a filter; or to stop xarelto; start asa and transition her care to hospice care. We did discuss her advanced care planning as well.  Her cbg's are are elevated. Her daughter reports that she is having bilateral knee pain.   Past Medical History:  Diagnosis Date  . CKD (chronic kidney disease)   . Dementia   . Diabetes mellitus without complication ([place])   . Hypertension   . Hypothyroid   . Hypothyroid   . Thyroid disease     Past Surgical History:  Procedure Laterality Date  . ABDOMINAL HYSTERECTOMY    . FRACTURE SURGERY    . INCISION AND DRAINAGE ABSCESS Left 10/22/2016   Procedure: INCISION AND DRAINAGE LEFT AXILLARY  ABSCESS;  Surgeon: Excell Seltzer, MD;  Location: [facility];  Service: General;  Laterality: Left;    Social History   Socioeconomic History  . Marital status: Widowed    Spouse name: Not on file  . Number of children: Not on file  . Years of education: Not on file  . Highest education level: Not on file  Social Needs  . Financial resource strain: Not on file  . Food insecurity - worry: Not on file  . Food insecurity - inability: Not on file  . Transportation needs - medical: Not on file  . Transportation needs - non-medical: Not on file  Occupational History  . Not on file  Tobacco Use  . Smoking status: Never Smoker  . Smokeless tobacco: Never Used  Substance and Sexual  Activity  . Alcohol use: No  . Drug use: No  . Sexual activity: Not Currently  Other Topics Concern  . Not on file  Social History Narrative  . Not on file   History reviewed. No pertinent family history.    VITAL SIGNS BP 120/62   Pulse 80   Temp 99.1 F (37.3 C)   Resp 16   Ht 5' 3"  (1.6 m)   Wt 151 lb (68.5 kg)   SpO2 96%   BMI 26.75 kg/m   Outpatient Encounter Medications as of 02/14/2017  Medication Sig  . acetaminophen (TYLENOL) 500 MG tablet Take 500 mg by mouth 3 (three) times daily.  Marland Kitchen amLODipine (NORVASC) 10 MG tablet Take 10 mg by mouth daily.  . [EXPIRED] cephALEXin (KEFLEX) 500 MG capsule Take 1 capsule (500 mg total) by mouth 2 (two) times daily for 5 days.  . collagenase (SANTYL) ointment Apply topically daily. Apply to wounds on both feet and left ischium  . insulin aspart (NOVOLOG) 100 UNIT/ML injection Inject 0-8 Units into the skin 3 (three) times daily before meals. Inject as per sliding scale subcutaneously before meals: 0 - 149 = 0 units 150 - 450 = 8 units Notify MD if less than 70 or greater than 450  . insulin detemir (LEVEMIR) 100 UNIT/ML injection Inject 15 Units into the  skin at bedtime.   Marland Kitchen levothyroxine (SYNTHROID, LEVOTHROID) 137 MCG tablet Take 137 mcg by mouth daily.   Marland Kitchen loperamide (IMODIUM A-D) 2 MG tablet Take 2 mg by mouth every 8 (eight) hours as needed for diarrhea or loose stools.  Marland Kitchen LORazepam (ATIVAN) 0.5 MG tablet Take 1 tablet (0.5 mg total) by mouth daily.  . magnesium hydroxide (MILK OF MAGNESIA) 400 MG/5ML suspension Take 30 mLs by mouth daily as needed for mild constipation.   . memantine (NAMENDA) 10 MG tablet Take 10 mg by mouth 2 (two) times daily.  . mirtazapine (REMERON) 15 MG tablet Take 7.5 mg by mouth at bedtime.  . Multiple Vitamins-Minerals (DECUBI-VITE) CAPS Take 1 capsule by mouth daily.  . Nutritional Supplements (NUTRITIONAL SUPPLEMENT PO) Take 120 mLs by mouth daily. *House Supplement*  . omeprazole (PRILOSEC) 20  MG capsule Take 20 mg by mouth daily.  . ondansetron (ZOFRAN-ODT) 8 MG disintegrating tablet Take 8 mg by mouth every 8 (eight) hours as needed for nausea or vomiting.  . Probiotic Product (PROBIOTIC DAILY) CAPS Take 1 capsule by mouth 2 (two) times daily.  . QUEtiapine (SEROQUEL) 50 MG tablet Take 25 mg in the morning and 50 mg at bedtime  . sertraline (ZOLOFT) 50 MG tablet Take 50 mg by mouth daily.   Marland Kitchen UNABLE TO FIND HSG CCD Diet - HSG Puree texture, regular consistency  . vitamin B-12 (CYANOCOBALAMIN) 1000 MCG tablet Take 1,000 mcg by mouth daily.  Alveda Reasons 20 MG TABS tablet Take 20 mg by mouth daily.   No facility-administered encounter medications on file as of 02/14/2017.      SIGNIFICANT DIAGNOSTIC EXAMS  PREVIOUS:   02-07-17: chest x-ray: No acute cardiopulmonary findings.  TODAY:   02-13-17: right lower extremity doppler: + dvt     LABS REVIEWED: PREVIOUS    03-01-16: wbc 5.3; hgb 13.1; hct 40.4; mcv 88.8; plt 228; glucose 211; bun 29; creat 1.84; k+ 4.3; na++ 138; tsh 30.991  04-15-16: wbc 4.9; hgb 12.7; hct 40.1; mcv 92.3; plt 200; glucose 169; bun 23.6; creat 1.21; k+ 4.4; na++ 141; liver normal albumin 3.8; hgb a1c 8.2 04-16-16: glucose 171; bun 19.3; creat 1.35; k+ 4.8; na++ 140; liver normal albumin 3.6; chol 190; ldl 112; trig 109; hdl 56 tsh 15.82; free T4: 0.63; hgb a1c 8.0  05-15-16: tsh 71.63 free T 4: 0.33 06-18-16: tsh 4.63; free T 4: 1.20 free T 3: 1.7 08-20-16: glucose 116; bun 41.9; creat 1.50; k+ 4.4; na++ 145; ca 9.1 tsh 2.06 ast 11; hgb a1c 7.3; chol 142; ldl 63; trig 83; hdl 62; urine micro-albumin 1.6  10-21-16: wbc 6.1; hgb 10.1; hct 31.8; mcv 89.3; plt 296; glucose 245; bun 48; creat 1.5; k+ 5.0 na++ 150; ca 9.4 10-22-16; wbc 5.8; hgb 9.7; hct 30.8; mcv 89.8; plt 280; glucose 205; bun 36; creat 1.32; k+ 4.6; na++ 149; liver normal albumin 3.1 11-26-16: stool for c-diff: +  11-29-16: Wbc 6.3; hgb 9.5; hct 30.0; mcv 94.7; plt 287; glucose 72; bun 39.9; creat 1.56;  k+ 4.9; na++ 142; ca 8.8; liver normal albumin 3.4 02-05-17; hgb a1c 11.7; chol 165; ldl 91; trig 171; hdl 39 02-07-17: wbc 11.8; hgb 11.8; hct 38.4; mcv 92.1; plt 293; glucose 335; bun 45; creat 1.57; k+ 4.9; na++ 156; ca 9.0; liver normal albumin 2.6; mag 2.1; hgb a1c 11.4; urine culture: e-coli: keflex 02-09-17: wbc 7.9; hgb 10.2; ht 34.0; mcv 92.6; plt 244; glucose 208; bun 32; creat 1.30; k+ 3.4; na++ 152;  ca 8.6 02-10-17: wbc 7.0; hgb 9.9; ht 32.9; mcv 91.4; plt 215; glucose 178; bun 24; creat 1.18; k+ 3.9; na++ 143; ca 8.1   NO NEW LABS    Review of Systems  Unable to perform ROS: Dementia (nonverbal )    Physical Exam  Constitutional: No distress.  Frail   Neck: No thyromegaly present.  Cardiovascular: Normal rate, regular rhythm and intact distal pulses.  Murmur heard. 1/6  Pulmonary/Chest: Effort normal and breath sounds normal. No respiratory distress.  Abdominal: Soft. Bowel sounds are normal. She exhibits no distension. There is no tenderness.  Musculoskeletal: She exhibits edema.  Left hemiplegia 1+ right lower extremity edema   Lymphadenopathy:    She has no cervical adenopathy.  Neurological:  Is aware  Skin: Skin is warm and dry. She is not diaphoretic.  Left distal medial foot: DTI; 1.7 x 1.4 cm  Left medial shin: unstaged: 4.4 x 1.5 cm Right medial foot DTI: 1.8 x 1.7 cm Right distal medial foot: DTI; 2.5 x 3.4 cm Left lateral ankle: stage II 2.3 x 1.8 cm Right lateral ankle: stage III: 1.8 x 1.7 x 0.1 cm Left trochanter: stage II 4.0 x 2.9 cm    ASSESSMENT/ PLAN:  TODAY:   1. dvt right lower extremity 2.  Dementia 3. Failure to thrive in adult  Will stop xarelto and will begin asa 81 mg daily  Will begin volatern gel 2 gm three times daily to both knees Will increase levemir to 18 units and will change novolog to 10 units after meals Will setup hospice consult once she has completed her therapy  Time spent with family and resident 40 minutes: (20  minutes spent with advanced directives): discussed treatment options regarding her dvt; pain and diabetes. We did review her MOST form; will not make changes at this time. Family has verbalized understanding.    MD is aware of resident's narcotic use and is in agreement with current plan of care. We will attempt to wean resident as apropriate     Ok Edwards NP [facility]  Contact [phone] Monday through Friday 8am- 5pm  After hours call [phone]

## 2017-02-18 LAB — TSH: TSH: 8.37 — AB (ref 0.41–5.90)

## 2017-02-19 ENCOUNTER — Non-Acute Institutional Stay (SKILLED_NURSING_FACILITY): Payer: Medicare Other | Admitting: Adult Health

## 2017-02-19 ENCOUNTER — Encounter: Payer: Self-pay | Admitting: Adult Health

## 2017-02-19 DIAGNOSIS — E034 Atrophy of thyroid (acquired): Secondary | ICD-10-CM | POA: Diagnosis not present

## 2017-02-19 DIAGNOSIS — R627 Adult failure to thrive: Secondary | ICD-10-CM | POA: Diagnosis not present

## 2017-02-19 DIAGNOSIS — F0151 Vascular dementia with behavioral disturbance: Secondary | ICD-10-CM | POA: Diagnosis not present

## 2017-02-19 DIAGNOSIS — F01518 Vascular dementia, unspecified severity, with other behavioral disturbance: Secondary | ICD-10-CM

## 2017-02-19 NOTE — Progress Notes (Signed)
Location:   [facility] Room Number: 202 B Place of Service:  SNF (31)   CODE STATUS: DNR  No Known Allergies  Chief Complaint  Patient presents with  . Acute Visit    Follow up Thyroid lab results    HPI:  Her TSH is elevated at 8.37. She will be coming off therapy on 02-21-17; and will be able to transition to hospice care. This tsh level is not clinically significance for her. She has very little awareness of her surroundings. She is unable to participate in the hpi or ros. There are no reports of fevers; uncontrolled pain; or restlessness. There are no nursing concerns at this time.   Past Medical History:  Diagnosis Date  . CKD (chronic kidney disease)   . Dementia   . Diabetes mellitus without complication (HCC)   . Hypertension   . Hypothyroid   . Hypothyroid   . Thyroid disease     Past Surgical History:  Procedure Laterality Date  . ABDOMINAL HYSTERECTOMY    . FRACTURE SURGERY    . INCISION AND DRAINAGE ABSCESS Left 10/22/2016   Procedure: INCISION AND DRAINAGE LEFT AXILLARY  ABSCESS;  Surgeon: Glenna FellowsHoxworth, Benjamin, MD;  Location: [facility];  Service: General;  Laterality: Left;    Social History   Socioeconomic History  . Marital status: Widowed    Spouse name: Not on file  . Number of children: Not on file  . Years of education: Not on file  . Highest education level: Not on file  Social Needs  . Financial resource strain: Not on file  . Food insecurity - worry: Not on file  . Food insecurity - inability: Not on file  . Transportation needs - medical: Not on file  . Transportation needs - non-medical: Not on file  Occupational History  . Not on file  Tobacco Use  . Smoking status: Never Smoker  . Smokeless tobacco: Never Used  Substance and Sexual Activity  . Alcohol use: No  . Drug use: No  . Sexual activity: Not Currently  Other Topics Concern  . Not on file  Social History Narrative  . Not on file   History reviewed. No pertinent  family history.    VITAL SIGNS BP 120/62   Pulse 80   Temp 99.1 F (37.3 C)   Resp 16   Ht 5\' 3"  (1.6 m)   Wt 151 lb (68.5 kg)   SpO2 96%   BMI 26.75 kg/m    Outpatient Encounter Medications as of 02/19/2017  Medication Sig  . acetaminophen (TYLENOL) 500 MG tablet Take 500 mg by mouth 3 (three) times daily.  Marland Kitchen. amLODipine (NORVASC) 10 MG tablet Take 10 mg by mouth daily.  Marland Kitchen. aspirin EC 81 MG tablet Take 81 mg by mouth daily.  . collagenase (SANTYL) ointment Apply topically daily. Apply to wounds on both feet and left ischium  . diclofenac sodium (VOLTAREN) 1 % GEL Apply 2 gram transdermally three times a day for bilateral knee pain  . insulin aspart (NOVOLOG) 100 UNIT/ML injection Inject 0-8 Units into the skin 3 (three) times daily before meals. Inject as per sliding scale subcutaneously before meals: 0 - 149 = 0 units 150 - 450 = 10 units Notify MD if less than 70 or greater than 450  . insulin detemir (LEVEMIR) 100 UNIT/ML injection Inject 18 Units into the skin at bedtime.   Marland Kitchen. levothyroxine (SYNTHROID, LEVOTHROID) 137 MCG tablet Take 137 mcg by mouth daily.   .Marland Kitchen  loperamide (IMODIUM A-D) 2 MG tablet Take 2 mg by mouth every 8 (eight) hours as needed for diarrhea or loose stools.  Marland Kitchen LORazepam (ATIVAN) 0.5 MG tablet Take 1 tablet (0.5 mg total) by mouth daily.  . magnesium hydroxide (MILK OF MAGNESIA) 400 MG/5ML suspension Take 30 mLs by mouth daily as needed for mild constipation.   . memantine (NAMENDA) 10 MG tablet Take 10 mg by mouth 2 (two) times daily.  . mirtazapine (REMERON) 15 MG tablet Take 7.5 mg by mouth at bedtime.  . Multiple Vitamins-Minerals (DECUBI-VITE) CAPS Take 1 capsule by mouth daily.  . Nutritional Supplements (NUTRITIONAL SUPPLEMENT PO) Take 120 mLs by mouth daily. *House Supplement*  . omeprazole (PRILOSEC) 20 MG capsule Take 20 mg by mouth daily.  . ondansetron (ZOFRAN-ODT) 8 MG disintegrating tablet Take 8 mg by mouth every 8 (eight) hours as needed for  nausea or vomiting.  . Probiotic Product (PROBIOTIC DAILY) CAPS Take 1 capsule by mouth 2 (two) times daily.  . QUEtiapine (SEROQUEL) 50 MG tablet Take 25 mg in the morning and 50 mg at bedtime  . sertraline (ZOLOFT) 50 MG tablet Take 75 mg by mouth daily.   Marland Kitchen UNABLE TO FIND HSG CCD Diet - HSG Puree texture, regular consistency  . vitamin B-12 (CYANOCOBALAMIN) 1000 MCG tablet Take 1,000 mcg by mouth daily.  . [DISCONTINUED] XARELTO 20 MG TABS tablet Take 20 mg by mouth daily.   No facility-administered encounter medications on file as of 02/19/2017.      SIGNIFICANT DIAGNOSTIC EXAMS  PREVIOUS:   02-07-17: chest x-ray: No acute cardiopulmonary findings.  02-13-17: right lower extremity doppler: + dvt  NO NEW EXAMS      LABS REVIEWED: PREVIOUS    03-01-16: wbc 5.3; hgb 13.1; hct 40.4; mcv 88.8; plt 228; glucose 211; bun 29; creat 1.84; k+ 4.3; na++ 138; tsh 30.991  04-15-16: wbc 4.9; hgb 12.7; hct 40.1; mcv 92.3; plt 200; glucose 169; bun 23.6; creat 1.21; k+ 4.4; na++ 141; liver normal albumin 3.8; hgb a1c 8.2 04-16-16: glucose 171; bun 19.3; creat 1.35; k+ 4.8; na++ 140; liver normal albumin 3.6; chol 190; ldl 112; trig 109; hdl 56 tsh 15.82; free T4: 0.63; hgb a1c 8.0  05-15-16: tsh 71.63 free T 4: 0.33 06-18-16: tsh 4.63; free T 4: 1.20 free T 3: 1.7 08-20-16: glucose 116; bun 41.9; creat 1.50; k+ 4.4; na++ 145; ca 9.1 tsh 2.06 ast 11; hgb a1c 7.3; chol 142; ldl 63; trig 83; hdl 62; urine micro-albumin 1.6  10-21-16: wbc 6.1; hgb 10.1; hct 31.8; mcv 89.3; plt 296; glucose 245; bun 48; creat 1.5; k+ 5.0 na++ 150; ca 9.4 10-22-16; wbc 5.8; hgb 9.7; hct 30.8; mcv 89.8; plt 280; glucose 205; bun 36; creat 1.32; k+ 4.6; na++ 149; liver normal albumin 3.1 11-26-16: stool for c-diff: +  11-29-16: Wbc 6.3; hgb 9.5; hct 30.0; mcv 94.7; plt 287; glucose 72; bun 39.9; creat 1.56; k+ 4.9; na++ 142; ca 8.8; liver normal albumin 3.4 02-05-17; hgb a1c 11.7; chol 165; ldl 91; trig 171; hdl 39 02-07-17: wbc  11.8; hgb 11.8; hct 38.4; mcv 92.1; plt 293; glucose 335; bun 45; creat 1.57; k+ 4.9; na++ 156; ca 9.0; liver normal albumin 2.6; mag 2.1; hgb a1c 11.4; urine culture: e-coli: keflex 02-09-17: wbc 7.9; hgb 10.2; ht 34.0; mcv 92.6; plt 244; glucose 208; bun 32; creat 1.30; k+ 3.4; na++ 152; ca 8.6 02-10-17: wbc 7.0; hgb 9.9; ht 32.9; mcv 91.4; plt 215; glucose 178; bun 24; creat  1.18; k+ 3.9; na++ 143; ca 8.1   TODAY:   02-18-17: TSH 8.37    Review of Systems  Unable to perform ROS: Dementia (nonverbal )    Physical Exam  Constitutional: No distress.  frail  Neck: No thyromegaly present.  Cardiovascular: Normal rate, regular rhythm and intact distal pulses.  Murmur heard. 1/6  Pulmonary/Chest: Effort normal and breath sounds normal. No respiratory distress.  Abdominal: Soft. Bowel sounds are normal. She exhibits no distension. There is no tenderness.  Musculoskeletal: She exhibits edema.  Left hemiplegia 1+ right lower extremity edema   Lymphadenopathy:    She has no cervical adenopathy.  Neurological:  Is aware  Skin: Skin is warm and dry. She is not diaphoretic.  Left distal medial foot: DTI; 1.7 x 1.4 cm  Left medial shin: unstaged: 4.4 x 1.5 cm Right medial foot DTI: 1.8 x 1.7 cm Right distal medial foot: DTI; 2.5 x 3.4 cm Left lateral ankle: stage II 2.3 x 1.8 cm Right lateral ankle: stage III: 1.8 x 1.7 x 0.1 cm Left trochanter: stage II 4.0 x 2.9 cm    ASSESSMENT/ PLAN:  1. Vascular dementia with behavioral disturbance 2. Hypothyroidism due to acquired atrophy of thyroid 3. Adult failure to thrive  Will not make any medication changes at this time Will setup for a hospice consult on 02-24-17   MD is aware of resident's narcotic use and is in agreement with current plan of care. We will attempt to wean resident as apropriate   Synthia Innocenteborah Ryley Teater NP [facility]  Contact [phone] Monday through Friday 8am- 5pm  After hours call [phone]

## 2017-03-04 DIAGNOSIS — I82401 Acute embolism and thrombosis of unspecified deep veins of right lower extremity: Secondary | ICD-10-CM | POA: Insufficient documentation

## 2017-03-04 DIAGNOSIS — R627 Adult failure to thrive: Secondary | ICD-10-CM | POA: Insufficient documentation

## 2017-03-13 ENCOUNTER — Non-Acute Institutional Stay (SKILLED_NURSING_FACILITY): Payer: Medicare Other | Admitting: Adult Health

## 2017-03-13 ENCOUNTER — Encounter: Payer: Self-pay | Admitting: Adult Health

## 2017-03-13 DIAGNOSIS — E034 Atrophy of thyroid (acquired): Secondary | ICD-10-CM | POA: Diagnosis not present

## 2017-03-13 DIAGNOSIS — I10 Essential (primary) hypertension: Secondary | ICD-10-CM

## 2017-03-13 DIAGNOSIS — E1159 Type 2 diabetes mellitus with other circulatory complications: Secondary | ICD-10-CM

## 2017-03-13 DIAGNOSIS — R1311 Dysphagia, oral phase: Secondary | ICD-10-CM

## 2017-03-13 DIAGNOSIS — K219 Gastro-esophageal reflux disease without esophagitis: Secondary | ICD-10-CM

## 2017-03-13 NOTE — Progress Notes (Signed)
Location:   [facility] Room Number: 202 B Place of Service:  SNF (31)   CODE STATUS: DNR  No Known Allergies  Chief Complaint  Patient presents with  . Medical Management of Chronic Issues    Hypertension; dysphagia; gerd; hypothyroidism     HPI:  She is an 84 year old long term resident of this facility being seen for the management of her chronic illnesses: hypertension; dysphagia; gerd; hypothyroidism. She is unable to participate in the hpi or ros. She continues to be followed by hospice care. There are no reports of uncontrolled pain; no reports of aspirations; no reports of agitation. There are no nursing concerns at this time.   Past Medical History:  Diagnosis Date  . CKD (chronic kidney disease)   . Dementia   . Diabetes mellitus without complication (HCC)   . Hypertension   . Hypothyroid   . Hypothyroid   . Thyroid disease     Past Surgical History:  Procedure Laterality Date  . ABDOMINAL HYSTERECTOMY    . FRACTURE SURGERY    . INCISION AND DRAINAGE ABSCESS Left 10/22/2016   Procedure: INCISION AND DRAINAGE LEFT AXILLARY  ABSCESS;  Surgeon: Glenna Fellows, MD;  Location: [facility];  Service: General;  Laterality: Left;    Social History   Socioeconomic History  . Marital status: Widowed    Spouse name: Not on file  . Number of children: Not on file  . Years of education: Not on file  . Highest education level: Not on file  Social Needs  . Financial resource strain: Not on file  . Food insecurity - worry: Not on file  . Food insecurity - inability: Not on file  . Transportation needs - medical: Not on file  . Transportation needs - non-medical: Not on file  Occupational History  . Not on file  Tobacco Use  . Smoking status: Never Smoker  . Smokeless tobacco: Never Used  Substance and Sexual Activity  . Alcohol use: No  . Drug use: No  . Sexual activity: Not Currently  Other Topics Concern  . Not on file  Social History Narrative    . Not on file   History reviewed. No pertinent family history.    VITAL SIGNS BP 132/66   Pulse 72   Temp 98.1 F (36.7 C)   Resp 18   Ht 5\' 3"  (1.6 m)   Wt 151 lb 4.8 oz (68.6 kg)   SpO2 97%   BMI 26.80 kg/m   Outpatient Encounter Medications as of 03/13/2017  Medication Sig  . acetaminophen (TYLENOL) 500 MG tablet Take 1,000 mg by mouth 3 (three) times daily.   Marland Kitchen amLODipine (NORVASC) 10 MG tablet Take 10 mg by mouth daily.  Marland Kitchen aspirin EC 81 MG tablet Take 81 mg by mouth daily.  . collagenase (SANTYL) ointment Apply topically daily. Apply to wounds on both feet and left ischium  . diclofenac sodium (VOLTAREN) 1 % GEL Apply 2 gram transdermally three times a day for bilateral knee pain  . insulin aspart (NOVOLOG) 100 UNIT/ML injection Inject 0-8 Units into the skin 3 (three) times daily before meals. Inject as per sliding scale subcutaneously before meals: 0 - 149 = 0 units 150 - 450 = 10 units Notify MD if less than 70 or greater than 450  . insulin detemir (LEVEMIR) 100 UNIT/ML injection Inject 18 Units into the skin at bedtime.   Marland Kitchen levothyroxine (SYNTHROID, LEVOTHROID) 125 MCG tablet Take 125 mcg by mouth  daily.   . loperamide (IMODIUM A-D) 2 MG tablet Take 2 mg by mouth every 8 (eight) hours as needed for diarrhea or loose stools.  Marland Kitchen LORazepam (ATIVAN) 0.5 MG tablet Take 1 tablet (0.5 mg total) by mouth daily.  . magnesium hydroxide (MILK OF MAGNESIA) 400 MG/5ML suspension Take 30 mLs by mouth daily as needed for mild constipation.   . memantine (NAMENDA) 10 MG tablet Take 10 mg by mouth 2 (two) times daily.  . mirtazapine (REMERON) 15 MG tablet Take 7.5 mg by mouth at bedtime.  . Multiple Vitamins-Minerals (DECUBI-VITE) CAPS Take 1 capsule by mouth daily.  . Nutritional Supplements (NUTRITIONAL SUPPLEMENT PO) Take 120 mLs by mouth daily. *House Supplement*  . omeprazole (PRILOSEC) 20 MG capsule Take 20 mg by mouth daily.  . ondansetron (ZOFRAN-ODT) 8 MG disintegrating  tablet Take 8 mg by mouth every 8 (eight) hours as needed for nausea or vomiting.  . Probiotic Product (PROBIOTIC DAILY) CAPS Take 1 capsule by mouth 2 (two) times daily.  . QUEtiapine (SEROQUEL) 50 MG tablet Take 25 mg in the morning and 50 mg at bedtime  . sertraline (ZOLOFT) 50 MG tablet Take 75 mg by mouth daily.   Marland Kitchen UNABLE TO FIND HSG CCD Diet - HSG Puree texture, regular consistency  . vitamin B-12 (CYANOCOBALAMIN) 1000 MCG tablet Take 1,000 mcg by mouth daily.   No facility-administered encounter medications on file as of 03/13/2017.      SIGNIFICANT DIAGNOSTIC EXAMS  PREVIOUS:   02-07-17: chest x-ray: No acute cardiopulmonary findings.  02-13-17: right lower extremity doppler: + dvt  NO NEW EXAMS      LABS REVIEWED: PREVIOUS    04-15-16: wbc 4.9; hgb 12.7; hct 40.1; mcv 92.3; plt 200; glucose 169; bun 23.6; creat 1.21; k+ 4.4; na++ 141; liver normal albumin 3.8; hgb a1c 8.2 04-16-16: glucose 171; bun 19.3; creat 1.35; k+ 4.8; na++ 140; liver normal albumin 3.6; chol 190; ldl 112; trig 109; hdl 56 tsh 15.82; free T4: 0.63; hgb a1c 8.0  05-15-16: tsh 71.63 free T 4: 0.33 06-18-16: tsh 4.63; free T 4: 1.20 free T 3: 1.7 08-20-16: glucose 116; bun 41.9; creat 1.50; k+ 4.4; na++ 145; ca 9.1 tsh 2.06 ast 11; hgb a1c 7.3; chol 142; ldl 63; trig 83; hdl 62; urine micro-albumin 1.6  10-21-16: wbc 6.1; hgb 10.1; hct 31.8; mcv 89.3; plt 296; glucose 245; bun 48; creat 1.5; k+ 5.0 na++ 150; ca 9.4 10-22-16; wbc 5.8; hgb 9.7; hct 30.8; mcv 89.8; plt 280; glucose 205; bun 36; creat 1.32; k+ 4.6; na++ 149; liver normal albumin 3.1 11-26-16: stool for c-diff: +  11-29-16: Wbc 6.3; hgb 9.5; hct 30.0; mcv 94.7; plt 287; glucose 72; bun 39.9; creat 1.56; k+ 4.9; na++ 142; ca 8.8; liver normal albumin 3.4 02-05-17; hgb a1c 11.7; chol 165; ldl 91; trig 171; hdl 39 02-07-17: wbc 11.8; hgb 11.8; hct 38.4; mcv 92.1; plt 293; glucose 335; bun 45; creat 1.57; k+ 4.9; na++ 156; ca 9.0; liver normal albumin 2.6; mag  2.1; hgb a1c 11.4; urine culture: e-coli: keflex 02-09-17: wbc 7.9; hgb 10.2; ht 34.0; mcv 92.6; plt 244; glucose 208; bun 32; creat 1.30; k+ 3.4; na++ 152; ca 8.6 02-10-17: wbc 7.0; hgb 9.9; ht 32.9; mcv 91.4; plt 215; glucose 178; bun 24; creat 1.18; k+ 3.9; na++ 143; ca 8.1 02-18-17: TSH 8.37   NO NEW LABS   Review of Systems  Unable to perform ROS: Dementia (nonverbal )   Physical Exam  Constitutional:  No distress.  Frail    Neck: No thyromegaly present.  Cardiovascular: Normal rate, regular rhythm and intact distal pulses.  Murmur heard. Pulmonary/Chest: Effort normal and breath sounds normal. No respiratory distress.  Abdominal: Soft. Bowel sounds are normal. She exhibits no distension.  Musculoskeletal: She exhibits edema.  Left hemiplegia 1+ right lower extremity edema    Lymphadenopathy:    She has no cervical adenopathy.  Neurological:  Is aware   Skin: Skin is warm and dry. She is not diaphoretic.  Unstaged left lateral hip: 3.5 x 3.0 x NM cm Stage III: right lateral ankle: 2.9 x 2.4 x 0.1 cm Stage IV: left lateral ankle: 2.4 x 1.9 x 0.2 cm DIT: right medal foot: 2 x 4.0 cm Unstaged left medial shin: 3.3 x 1.6 x 0.3 cm DTI left distal medial foot: 1.6 x 1.2 cm   Psychiatric: She has a normal mood and affect.     ASSESSMENT/ PLAN:  TODAY:   1. Hypertension associated with diabetes: stable b/p 132/66: will continue norvasc 10 mg daily   2. Dysphagia, oral phase: stable no signs of aspiration present is on thin liquids with pureed diet will not make changes   3.  GERD without esophagitis: stable will continue prilosec 20 mg daily   4. Hypothyroidism due to acquired atrophy: stable tsh 8.37; will continue synthroid 125 mcg daily  PREVIOUS   5. Dyslipidemia associated with type 2 diabetes; stable ldl 112 is not on statin at this time; she has been experiencing weight loss  6. 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 Vascular dementia with behavioral disturbances: without change will continue namenda 10 mg twice daily   8. Depression with anxiety: is emotionally stable; is presently taking ativan 0.5 mg daily to help with anxiety for AM care; ([id number]) zoloft 75 mg daily and will continue to monitor   9.  Diabetic foot ulcerations: has some component of pressure as well: without change; is being followed by the wound dr. Albumin 2.6  Will not make changes and will monitor   10. Cardiac arrhythmia: has a history of left arm dvt: heart rate is stable; is on asa 81 mg daily    11. Weight loss: 180 days ago weight was 169 pounds; current  weight 151 pounds: will be placed on fortified food; and house supplement; will  Continue remeron 7.5 mg nightly  12. Psychosis in elderly with behavioral disturbance: is without change in status; will continue seroquel 25 mg in the AM and 50 mg in the PM.   13. Right leg DVT; she has failed xarelto therapy. Her family had decided to stop this medication; and is presently on asa 81 mg daily      MD is aware of resident's narcotic use and is in agreement with current plan of care. We will attempt to wean resident as apropriate     Synthia Innocenteborah Green NP [facility]  Contact [phone] Monday through Friday 8am- 5pm  After hours call [phone]

## 2017-03-14 ENCOUNTER — Other Ambulatory Visit: Payer: Self-pay

## 2017-03-14 ENCOUNTER — Encounter: Payer: Self-pay | Admitting: Internal Medicine

## 2017-03-14 DIAGNOSIS — L97401 Non-pressure chronic ulcer of unspecified heel and midfoot limited to breakdown of skin: Secondary | ICD-10-CM

## 2017-03-14 DIAGNOSIS — E11621 Type 2 diabetes mellitus with foot ulcer: Secondary | ICD-10-CM | POA: Insufficient documentation

## 2017-03-14 MED ORDER — LORAZEPAM 0.5 MG PO TABS: 0.5000 mg | ORAL_TABLET | Freq: Every day | ORAL | 0 refills | Status: AC

## 2017-03-14 NOTE — Telephone Encounter (Signed)
RX faxed to [facility] @ [phone], phone number [phone] 

## 2017-03-21 ENCOUNTER — Encounter: Payer: Self-pay | Admitting: Adult Health

## 2017-03-21 ENCOUNTER — Other Ambulatory Visit: Payer: Self-pay

## 2017-03-21 ENCOUNTER — Non-Acute Institutional Stay (SKILLED_NURSING_FACILITY): Payer: Medicare Other | Admitting: Adult Health

## 2017-03-21 DIAGNOSIS — R52 Pain, unspecified: Secondary | ICD-10-CM

## 2017-03-21 DIAGNOSIS — G8929 Other chronic pain: Secondary | ICD-10-CM

## 2017-03-21 MED ORDER — MORPHINE SULFATE (CONCENTRATE) 20 MG/ML PO SOLN: ORAL | 0 refills | Status: DC

## 2017-03-21 NOTE — Telephone Encounter (Signed)
RX faxed to [facility] @ [phone], phone number [phone] 

## 2017-03-21 NOTE — Progress Notes (Signed)
Location:   [facility] Room Number: 202 B Place of Service:  SNF (31)   CODE STATUS: DNR  No Known Allergies  Chief Complaint  Patient presents with  . Acute Visit    Pain Management    HPI:  She continues to be followed by hospice care. She is having pain management issues. The hospice staff is concerned that her pain is not being managed. She is presently not taking pain medication. She is unable to participate in the hpi or ros; but did tell me that her butt hurts. Her family is willing to try morphine for pain management.    Past Medical History:  Diagnosis Date  . CKD (chronic kidney disease)   . Dementia   . Diabetes mellitus without complication (HCC)   . Hypertension   . Hypothyroid   . Hypothyroid   . Thyroid disease     Past Surgical History:  Procedure Laterality Date  . ABDOMINAL HYSTERECTOMY    . FRACTURE SURGERY    . INCISION AND DRAINAGE ABSCESS Left 10/22/2016   Procedure: INCISION AND DRAINAGE LEFT AXILLARY  ABSCESS;  Surgeon: Glenna FellowsHoxworth, Benjamin, MD;  Location: [facility];  Service: General;  Laterality: Left;    Social History   Socioeconomic History  . Marital status: Widowed    Spouse name: Not on file  . Number of children: Not on file  . Years of education: Not on file  . Highest education level: Not on file  Social Needs  . Financial resource strain: Not on file  . Food insecurity - worry: Not on file  . Food insecurity - inability: Not on file  . Transportation needs - medical: Not on file  . Transportation needs - non-medical: Not on file  Occupational History  . Not on file  Tobacco Use  . Smoking status: Never Smoker  . Smokeless tobacco: Never Used  Substance and Sexual Activity  . Alcohol use: No  . Drug use: No  . Sexual activity: Not Currently  Other Topics Concern  . Not on file  Social History Narrative  . Not on file   History reviewed. No pertinent family history.    VITAL SIGNS Ht 5\' 3"  (1.6 m)   Wt  151 lb 4.8 oz (68.6 kg)   BMI 26.80 kg/m   Outpatient Encounter Medications as of 03/21/2017  Medication Sig  . acetaminophen (TYLENOL) 500 MG tablet Take 1,000 mg by mouth 3 (three) times daily.   Marland Kitchen. amLODipine (NORVASC) 10 MG tablet Take 10 mg by mouth daily.  Marland Kitchen. aspirin EC 81 MG tablet Take 81 mg by mouth daily.  . diclofenac sodium (VOLTAREN) 1 % GEL Apply 2 gram transdermally three times a day for bilateral knee pain  . insulin aspart (NOVOLOG) 100 UNIT/ML injection Inject 0-8 Units into the skin 3 (three) times daily before meals. Inject as per sliding scale subcutaneously before meals: 0 - 149 = 0 units 150 - 450 = 10 units Notify MD if less than 70 or greater than 450  . insulin detemir (LEVEMIR) 100 UNIT/ML injection Inject 18 Units into the skin at bedtime.   Marland Kitchen. levothyroxine (SYNTHROID, LEVOTHROID) 125 MCG tablet Take 125 mcg by mouth daily.   Marland Kitchen. loperamide (IMODIUM A-D) 2 MG tablet Take 2 mg by mouth every 8 (eight) hours as needed for diarrhea or loose stools.  Marland Kitchen. LORazepam (ATIVAN) 0.5 MG tablet Take 1 tablet (0.5 mg total) by mouth daily.  . magnesium hydroxide (MILK OF MAGNESIA) 400 MG/5ML  suspension Take 30 mLs by mouth daily as needed for mild constipation.   . memantine (NAMENDA) 10 MG tablet Take 10 mg by mouth 2 (two) times daily.  . Multiple Vitamins-Minerals (DECUBI-VITE) CAPS Take 1 capsule by mouth daily.  . Nutritional Supplements (NUTRITIONAL SUPPLEMENT PO) Take 120 mLs by mouth daily. *House Supplement*  . omeprazole (PRILOSEC) 20 MG capsule Take 20 mg by mouth daily.  . ondansetron (ZOFRAN-ODT) 8 MG disintegrating tablet Take 8 mg by mouth every 8 (eight) hours as needed for nausea or vomiting.  . Probiotic Product (PROBIOTIC DAILY) CAPS Take 1 capsule by mouth 2 (two) times daily.  . QUEtiapine (SEROQUEL) 50 MG tablet Take 25 mg in the morning and 50 mg at bedtime  . sertraline (ZOLOFT) 50 MG tablet Take 75 mg by mouth daily.   Marland Kitchen UNABLE TO FIND HSG CCD Diet - HSG  Puree texture, regular consistency  . vitamin B-12 (CYANOCOBALAMIN) 1000 MCG tablet Take 1,000 mcg by mouth daily.  . collagenase (SANTYL) ointment Apply topically daily. Apply to wounds on both feet and left ischium  . [DISCONTINUED] mirtazapine (REMERON) 15 MG tablet Take 7.5 mg by mouth at bedtime.   No facility-administered encounter medications on file as of 03/21/2017.      SIGNIFICANT DIAGNOSTIC EXAMS   PREVIOUS:   02-07-17: chest x-ray: No acute cardiopulmonary findings.  02-13-17: right lower extremity doppler: + dvt  NO NEW EXAMS      LABS REVIEWED: PREVIOUS    04-15-16: wbc 4.9; hgb 12.7; hct 40.1; mcv 92.3; plt 200; glucose 169; bun 23.6; creat 1.21; k+ 4.4; na++ 141; liver normal albumin 3.8; hgb a1c 8.2 04-16-16: glucose 171; bun 19.3; creat 1.35; k+ 4.8; na++ 140; liver normal albumin 3.6; chol 190; ldl 112; trig 109; hdl 56 tsh 15.82; free T4: 0.63; hgb a1c 8.0  05-15-16: tsh 71.63 free T 4: 0.33 06-18-16: tsh 4.63; free T 4: 1.20 free T 3: 1.7 08-20-16: glucose 116; bun 41.9; creat 1.50; k+ 4.4; na++ 145; ca 9.1 tsh 2.06 ast 11; hgb a1c 7.3; chol 142; ldl 63; trig 83; hdl 62; urine micro-albumin 1.6  10-21-16: wbc 6.1; hgb 10.1; hct 31.8; mcv 89.3; plt 296; glucose 245; bun 48; creat 1.5; k+ 5.0 na++ 150; ca 9.4 10-22-16; wbc 5.8; hgb 9.7; hct 30.8; mcv 89.8; plt 280; glucose 205; bun 36; creat 1.32; k+ 4.6; na++ 149; liver normal albumin 3.1 11-26-16: stool for c-diff: +  11-29-16: Wbc 6.3; hgb 9.5; hct 30.0; mcv 94.7; plt 287; glucose 72; bun 39.9; creat 1.56; k+ 4.9; na++ 142; ca 8.8; liver normal albumin 3.4 02-05-17; hgb a1c 11.7; chol 165; ldl 91; trig 171; hdl 39 02-07-17: wbc 11.8; hgb 11.8; hct 38.4; mcv 92.1; plt 293; glucose 335; bun 45; creat 1.57; k+ 4.9; na++ 156; ca 9.0; liver normal albumin 2.6; mag 2.1; hgb a1c 11.4; urine culture: e-coli: keflex 02-09-17: wbc 7.9; hgb 10.2; ht 34.0; mcv 92.6; plt 244; glucose 208; bun 32; creat 1.30; k+ 3.4; na++ 152; ca  8.6 02-10-17: wbc 7.0; hgb 9.9; ht 32.9; mcv 91.4; plt 215; glucose 178; bun 24; creat 1.18; k+ 3.9; na++ 143; ca 8.1 02-18-17: TSH 8.37   NO NEW LABS    Review of Systems  Unable to perform ROS: Dementia (uanble to participate )    Physical Exam  Constitutional: No distress.  Frail   Neck: No thyromegaly present.  Cardiovascular: Normal rate, regular rhythm and intact distal pulses.  Murmur heard. Pulmonary/Chest: Effort normal and breath  sounds normal. No respiratory distress.  Abdominal: Soft. Bowel sounds are normal. She exhibits no distension. There is no tenderness.  Musculoskeletal: She exhibits edema.  Left hemiplegia 1+ right lower extremity edema   Lymphadenopathy:    She has no cervical adenopathy.  Neurological:  Is aware  Skin: Skin is warm and dry. She is not diaphoretic.  Left medial shin: unstaged: 4.0 x 2.0 x 0.3 cm 100 % granulation Left lateral hip: unstaged: 4.0 x 3.5 x NM cm 90% necrotic Right lateral ankle stage III: 2 x 2.9 x 0.1 cm  Left lateral ankle stage IV: 2.4 x 2.0 x 0.2 cm Right distal medial foot: DTI: 2.2 x 3.6 cm Right medial foot: DTI 1.8 x 0.4 cm    ASSESSMENT/ PLAN:  TODAY:   1. Acute on chronic generalized pain: will begin roxanol 5 mg twice daily routinely and will monitor her status.   MD is aware of resident's narcotic use and is in agreement with current plan of care. We will attempt to wean resident as appropriate.  Synthia Innocent NP [facility]  Contact [phone] Monday through Friday 8am- 5pm  After hours call [phone]

## 2017-03-26 ENCOUNTER — Encounter: Payer: Self-pay | Admitting: Adult Health

## 2017-03-26 ENCOUNTER — Non-Acute Institutional Stay (SKILLED_NURSING_FACILITY): Payer: Medicare Other | Admitting: Adult Health

## 2017-03-26 DIAGNOSIS — R509 Fever, unspecified: Secondary | ICD-10-CM | POA: Diagnosis not present

## 2017-03-26 NOTE — Progress Notes (Signed)
Location:   [facility] Room Number: 202 B Place of Service:      CODE STATUS: DNR  No Known Allergies  Chief Complaint  Patient presents with  . Acute Visit    Fever    HPI:  Staff reports that she ran a fever last PM of 102. She is presently afebrile. She did have a her wounds debrided yesterday. I have discussed this with the treatment; we have decided for no further debridement. The focus of her care is for comfort only. The treatment nurse will inform the wound doctor of her goals of care. There are no nursing concerns at this time.    Past Medical History:  Diagnosis Date  . CKD (chronic kidney disease)   . Dementia   . Diabetes mellitus without complication (HCC)   . Hypertension   . Hypothyroid   . Hypothyroid   . Thyroid disease     Past Surgical History:  Procedure Laterality Date  . ABDOMINAL HYSTERECTOMY    . FRACTURE SURGERY    . INCISION AND DRAINAGE ABSCESS Left 10/22/2016   Procedure: INCISION AND DRAINAGE LEFT AXILLARY  ABSCESS;  Surgeon: Glenna Fellows, MD;  Location: [facility];  Service: General;  Laterality: Left;    Social History   Socioeconomic History  . Marital status: Widowed    Spouse name: Not on file  . Number of children: Not on file  . Years of education: Not on file  . Highest education level: Not on file  Social Needs  . Financial resource strain: Not on file  . Food insecurity - worry: Not on file  . Food insecurity - inability: Not on file  . Transportation needs - medical: Not on file  . Transportation needs - non-medical: Not on file  Occupational History  . Not on file  Tobacco Use  . Smoking status: Never Smoker  . Smokeless tobacco: Never Used  Substance and Sexual Activity  . Alcohol use: No  . Drug use: No  . Sexual activity: Not Currently  Other Topics Concern  . Not on file  Social History Narrative  . Not on file   History reviewed. No pertinent family history.    VITAL SIGNS Temp (!) 102  F (38.9 C)   Ht 5\' 3"  (1.6 m)   Wt 151 lb 4.8 oz (68.6 kg)   BMI 26.80 kg/m   Outpatient Encounter Medications as of 03/26/2017  Medication Sig  . acetaminophen (TYLENOL) 500 MG tablet Take 1,000 mg by mouth 3 (three) times daily.   Marland Kitchen amLODipine (NORVASC) 10 MG tablet Take 10 mg by mouth daily.  Marland Kitchen aspirin EC 81 MG tablet Take 81 mg by mouth daily.  . collagenase (SANTYL) ointment Apply topically daily. Apply to wounds on both feet and left ischium  . diclofenac sodium (VOLTAREN) 1 % GEL Apply 2 gram transdermally three times a day for bilateral knee pain  . insulin aspart (NOVOLOG) 100 UNIT/ML injection Inject 0-8 Units into the skin 3 (three) times daily before meals. Inject as per sliding scale subcutaneously before meals: 0 - 149 = 0 units 150 - 450 = 10 units Notify MD if less than 70 or greater than 450  . insulin detemir (LEVEMIR) 100 UNIT/ML injection Inject 18 Units into the skin at bedtime.   Marland Kitchen levothyroxine (SYNTHROID, LEVOTHROID) 125 MCG tablet Take 125 mcg by mouth daily.   Marland Kitchen loperamide (IMODIUM A-D) 2 MG tablet Take 2 mg by mouth every 8 (eight) hours as needed  for diarrhea or loose stools.  Marland Kitchen. LORazepam (ATIVAN) 0.5 MG tablet Take 1 tablet (0.5 mg total) by mouth daily.  . magnesium hydroxide (MILK OF MAGNESIA) 400 MG/5ML suspension Take 30 mLs by mouth daily as needed for mild constipation.   . memantine (NAMENDA) 10 MG tablet Take 10 mg by mouth 2 (two) times daily.  Marland Kitchen. morphine (ROXANOL) 20 MG/ML concentrated solution Give 5 mg by mouth two times daily routinely  . Multiple Vitamins-Minerals (DECUBI-VITE) CAPS Take 1 capsule by mouth daily.  . Nutritional Supplements (NUTRITIONAL SUPPLEMENT PO) Take 120 mLs by mouth daily. *House Supplement*  . omeprazole (PRILOSEC) 20 MG capsule Take 20 mg by mouth daily.  . ondansetron (ZOFRAN-ODT) 8 MG disintegrating tablet Take 8 mg by mouth every 8 (eight) hours as needed for nausea or vomiting.  . Probiotic Product (PROBIOTIC DAILY)  CAPS Take 1 capsule by mouth 2 (two) times daily.  . QUEtiapine (SEROQUEL) 50 MG tablet Take 25 mg in the morning and 50 mg at bedtime  . sertraline (ZOLOFT) 50 MG tablet Take 75 mg by mouth daily.   Marland Kitchen. UNABLE TO FIND HSG CCD Diet - HSG Puree texture, regular consistency  . vitamin B-12 (CYANOCOBALAMIN) 1000 MCG tablet Take 1,000 mcg by mouth daily.   No facility-administered encounter medications on file as of 03/26/2017.      SIGNIFICANT DIAGNOSTIC EXAMS   PREVIOUS:   02-07-17: chest x-ray: No acute cardiopulmonary findings.  02-13-17: right lower extremity doppler: + dvt  NO NEW EXAMS      LABS REVIEWED: PREVIOUS    04-15-16: wbc 4.9; hgb 12.7; hct 40.1; mcv 92.3; plt 200; glucose 169; bun 23.6; creat 1.21; k+ 4.4; na++ 141; liver normal albumin 3.8; hgb a1c 8.2 04-16-16: glucose 171; bun 19.3; creat 1.35; k+ 4.8; na++ 140; liver normal albumin 3.6; chol 190; ldl 112; trig 109; hdl 56 tsh 15.82; free T4: 0.63; hgb a1c 8.0  05-15-16: tsh 71.63 free T 4: 0.33 06-18-16: tsh 4.63; free T 4: 1.20 free T 3: 1.7 08-20-16: glucose 116; bun 41.9; creat 1.50; k+ 4.4; na++ 145; ca 9.1 tsh 2.06 ast 11; hgb a1c 7.3; chol 142; ldl 63; trig 83; hdl 62; urine micro-albumin 1.6  10-21-16: wbc 6.1; hgb 10.1; hct 31.8; mcv 89.3; plt 296; glucose 245; bun 48; creat 1.5; k+ 5.0 na++ 150; ca 9.4 10-22-16; wbc 5.8; hgb 9.7; hct 30.8; mcv 89.8; plt 280; glucose 205; bun 36; creat 1.32; k+ 4.6; na++ 149; liver normal albumin 3.1 11-26-16: stool for c-diff: +  11-29-16: Wbc 6.3; hgb 9.5; hct 30.0; mcv 94.7; plt 287; glucose 72; bun 39.9; creat 1.56; k+ 4.9; na++ 142; ca 8.8; liver normal albumin 3.4 02-05-17; hgb a1c 11.7; chol 165; ldl 91; trig 171; hdl 39 02-07-17: wbc 11.8; hgb 11.8; hct 38.4; mcv 92.1; plt 293; glucose 335; bun 45; creat 1.57; k+ 4.9; na++ 156; ca 9.0; liver normal albumin 2.6; mag 2.1; hgb a1c 11.4; urine culture: e-coli: keflex 02-09-17: wbc 7.9; hgb 10.2; ht 34.0; mcv 92.6; plt 244; glucose 208;  bun 32; creat 1.30; k+ 3.4; na++ 152; ca 8.6 02-10-17: wbc 7.0; hgb 9.9; ht 32.9; mcv 91.4; plt 215; glucose 178; bun 24; creat 1.18; k+ 3.9; na++ 143; ca 8.1 02-18-17: TSH 8.37   NO NEW LABS    Review of Systems  Unable to perform ROS: Dementia (uanble to participate )    Physical Exam  Constitutional: No distress.  Frail   Neck: No thyromegaly present.  Cardiovascular: Normal  rate, regular rhythm and intact distal pulses.  Murmur heard. Pulmonary/Chest: Effort normal and breath sounds normal. No respiratory distress.  Abdominal: Soft. She exhibits no distension. There is no tenderness.  Musculoskeletal:  Left hemiplegia 1+ right lower extremity edema    Lymphadenopathy:    She has no cervical adenopathy.  Neurological:  Is aware   Skin: Skin is warm and dry. She is not diaphoretic.  Left lateral hip: unstaged: 3.9 x 2.9 x 0.3 cm Right lateral ankle: stage III 2.3 x 3.1 x 0.2 cm Left lateral ankle: unstaged: 1.7 x 1.4 x 0.1 cm Right distal medial foot: unstaged: 2.0 x 1.5 NM cm Left medial shin: unstaged: 7.1 x 1.8 x 0.2 cm Left distal medial foot: DTI: 1.8 x 1.1 x NM cm     ASSESSMENT/ PLAN:   TODAY:   1. Fever: more than likely this is an inflammatory response from her wound debridement. Will not make changes at this time and will continue to monitor her status.  MD is aware of resident's narcotic use and is in agreement with current plan of care. We will attempt to wean resident as apropriate   Synthia Innocent NP [facility]  Contact [phone] Monday through Friday 8am- 5pm  After hours call [phone]

## 2017-04-18 ENCOUNTER — Non-Acute Institutional Stay (SKILLED_NURSING_FACILITY): Payer: Medicare Other | Admitting: Adult Health

## 2017-04-18 ENCOUNTER — Encounter: Payer: Self-pay | Admitting: Adult Health

## 2017-04-18 DIAGNOSIS — F418 Other specified anxiety disorders: Secondary | ICD-10-CM

## 2017-04-18 DIAGNOSIS — E785 Hyperlipidemia, unspecified: Secondary | ICD-10-CM | POA: Diagnosis not present

## 2017-04-18 DIAGNOSIS — Z794 Long term (current) use of insulin: Secondary | ICD-10-CM

## 2017-04-18 DIAGNOSIS — F0151 Vascular dementia with behavioral disturbance: Secondary | ICD-10-CM

## 2017-04-18 DIAGNOSIS — N184 Chronic kidney disease, stage 4 (severe): Secondary | ICD-10-CM | POA: Diagnosis not present

## 2017-04-18 DIAGNOSIS — E1169 Type 2 diabetes mellitus with other specified complication: Secondary | ICD-10-CM | POA: Diagnosis not present

## 2017-04-18 DIAGNOSIS — F01518 Vascular dementia, unspecified severity, with other behavioral disturbance: Secondary | ICD-10-CM

## 2017-04-18 DIAGNOSIS — E1122 Type 2 diabetes mellitus with diabetic chronic kidney disease: Secondary | ICD-10-CM

## 2017-04-18 NOTE — Progress Notes (Signed)
Location: [facility] Room Number: 202-B Place of Service:  SNF (31)   CODE STATUS: DNR  No Known Allergies  Chief Complaint  Patient presents with  . Medical Management of Chronic Issues    Routine Visit     HPI:  She is a 84 year old long term resident of this facility being seen for the management of her chronic illnesses; diabetes; dyslipidemia; dementia; depression. She is unable to participate in the hpi or ros. She continues to be followed by hospice care. There are no reports of fevers; no uncontrolled pain; no changes in behaviors.   Past Medical History:  Diagnosis Date  . CKD (chronic kidney disease)   . Dementia   . Diabetes mellitus without complication (HCC)   . Hypertension   . Hypothyroid   . Hypothyroid   . Thyroid disease     Past Surgical History:  Procedure Laterality Date  . ABDOMINAL HYSTERECTOMY    . FRACTURE SURGERY    . INCISION AND DRAINAGE ABSCESS Left 10/22/2016   Procedure: INCISION AND DRAINAGE LEFT AXILLARY  ABSCESS;  Surgeon: Glenna Fellows, MD;  Location: [facility];  Service: General;  Laterality: Left;    Social History   Socioeconomic History  . Marital status: Widowed    Spouse name: Not on file  . Number of children: Not on file  . Years of education: Not on file  . Highest education level: Not on file  Social Needs  . Financial resource strain: Not on file  . Food insecurity - worry: Not on file  . Food insecurity - inability: Not on file  . Transportation needs - medical: Not on file  . Transportation needs - non-medical: Not on file  Occupational History  . Not on file  Tobacco Use  . Smoking status: Never Smoker  . Smokeless tobacco: Never Used  Substance and Sexual Activity  . Alcohol use: No  . Drug use: No  . Sexual activity: Not Currently  Other Topics Concern  . Not on file  Social History Narrative  . Not on file   History reviewed. No pertinent family history.    VITAL SIGNS There  were no vitals taken for this visit.   None available   Outpatient Encounter Medications as of 04/18/2017  Medication Sig  . acetaminophen (TYLENOL) 500 MG tablet Take 1,000 mg by mouth 3 (three) times daily.   Marland Kitchen amLODipine (NORVASC) 10 MG tablet Take 10 mg by mouth daily.  Marland Kitchen aspirin EC 81 MG tablet Take 81 mg by mouth daily.  . collagenase (SANTYL) ointment Apply topically daily. Apply to wounds on both feet and left ischium  . diclofenac sodium (VOLTAREN) 1 % GEL Apply 2 gram transdermally three times a day for bilateral knee pain  . insulin aspart (NOVOLOG) 100 UNIT/ML injection Inject 10 Units into the skin 3 (three) times daily after meals.   . insulin detemir (LEVEMIR) 100 UNIT/ML injection Inject 18 Units into the skin at bedtime.   Marland Kitchen levothyroxine (SYNTHROID, LEVOTHROID) 125 MCG tablet Take 125 mcg by mouth daily.   Marland Kitchen loperamide (IMODIUM A-D) 2 MG tablet Take 2 mg by mouth every 8 (eight) hours as needed for diarrhea or loose stools.  Marland Kitchen LORazepam (ATIVAN) 0.5 MG tablet Take 1 tablet (0.5 mg total) by mouth daily.  . magnesium hydroxide (MILK OF MAGNESIA) 400 MG/5ML suspension Take 30 mLs by mouth daily as needed for mild constipation.   . memantine (NAMENDA) 10 MG tablet Take 10 mg  by mouth 2 (two) times daily.  Marland Kitchen morphine (ROXANOL) 20 MG/ML concentrated solution Give 5 mg by mouth two times daily routinely  . Multiple Vitamins-Minerals (DECUBI-VITE) CAPS Take 1 capsule by mouth daily.  . Nutritional Supplements (NUTRITIONAL SUPPLEMENT PO) Take 120 mLs by mouth daily. *House Supplement*  . omeprazole (PRILOSEC) 20 MG capsule Take 20 mg by mouth daily.  . ondansetron (ZOFRAN-ODT) 8 MG disintegrating tablet Take 8 mg by mouth every 8 (eight) hours as needed for nausea or vomiting.  . Probiotic Product (PROBIOTIC DAILY) CAPS Take 1 capsule by mouth 2 (two) times daily.  . QUEtiapine (SEROQUEL) 50 MG tablet Take 25 mg in the morning and 50 mg at bedtime  . sertraline (ZOLOFT) 50 MG tablet  Take 75 mg by mouth daily.   Marland Kitchen UNABLE TO FIND HSG CCD Diet - HSG Puree texture, regular consistency  . vitamin B-12 (CYANOCOBALAMIN) 1000 MCG tablet Take 1,000 mcg by mouth daily.   No facility-administered encounter medications on file as of 04/18/2017.      SIGNIFICANT DIAGNOSTIC EXAMS PREVIOUS:   02-07-17: chest x-ray: No acute cardiopulmonary findings.  02-13-17: right lower extremity doppler: + dvt  NO NEW EXAMS      LABS REVIEWED: PREVIOUS    04-15-16: wbc 4.9; hgb 12.7; hct 40.1; mcv 92.3; plt 200; glucose 169; bun 23.6; creat 1.21; k+ 4.4; na++ 141; liver normal albumin 3.8; hgb a1c 8.2 04-16-16: glucose 171; bun 19.3; creat 1.35; k+ 4.8; na++ 140; liver normal albumin 3.6; chol 190; ldl 112; trig 109; hdl 56 tsh 15.82; free T4: 0.63; hgb a1c 8.0  05-15-16: tsh 71.63 free T 4: 0.33 06-18-16: tsh 4.63; free T 4: 1.20 free T 3: 1.7 08-20-16: glucose 116; bun 41.9; creat 1.50; k+ 4.4; na++ 145; ca 9.1 tsh 2.06 ast 11; hgb a1c 7.3; chol 142; ldl 63; trig 83; hdl 62; urine micro-albumin 1.6  10-21-16: wbc 6.1; hgb 10.1; hct 31.8; mcv 89.3; plt 296; glucose 245; bun 48; creat 1.5; k+ 5.0 na++ 150; ca 9.4 10-22-16; wbc 5.8; hgb 9.7; hct 30.8; mcv 89.8; plt 280; glucose 205; bun 36; creat 1.32; k+ 4.6; na++ 149; liver normal albumin 3.1 11-26-16: stool for c-diff: +  11-29-16: Wbc 6.3; hgb 9.5; hct 30.0; mcv 94.7; plt 287; glucose 72; bun 39.9; creat 1.56; k+ 4.9; na++ 142; ca 8.8; liver normal albumin 3.4 02-05-17; hgb a1c 11.7; chol 165; ldl 91; trig 171; hdl 39 02-07-17: wbc 11.8; hgb 11.8; hct 38.4; mcv 92.1; plt 293; glucose 335; bun 45; creat 1.57; k+ 4.9; na++ 156; ca 9.0; liver normal albumin 2.6; mag 2.1; hgb a1c 11.4; urine culture: e-coli: keflex 02-09-17: wbc 7.9; hgb 10.2; ht 34.0; mcv 92.6; plt 244; glucose 208; bun 32; creat 1.30; k+ 3.4; na++ 152; ca 8.6 02-10-17: wbc 7.0; hgb 9.9; ht 32.9; mcv 91.4; plt 215; glucose 178; bun 24; creat 1.18; k+ 3.9; na++ 143; ca 8.1 02-18-17: TSH 8.37    NO NEW LABS    Review of Systems  Unable to perform ROS: Dementia (unable to participate)    Physical Exam  Constitutional: No distress.  Frail   Neck: No thyromegaly present.  Cardiovascular: Normal rate, regular rhythm and intact distal pulses.  Murmur heard. Pulmonary/Chest: Effort normal and breath sounds normal. No respiratory distress.  Abdominal: Soft. Bowel sounds are normal. She exhibits no distension. There is no tenderness.  Musculoskeletal:  Left hemiplegia 1+ right lower extremity edema  Has bilateral lower extremity contractures   Lymphadenopathy:  She has no cervical adenopathy.  Neurological:  Is aware   Skin: Skin is warm and dry. She is not diaphoretic.  Left lateral hip: stage IV: 4.2 x 4.2 x 4.0 cm Right lateral ankle stage IV: 3.6 x 2.4 x 0.3 cm    ASSESSMENT/ PLAN:  TODAY:   1. Dyslipidemia associated with type 2 diabetes; stable ldl 112 is not on statin at this time; she has been experiencing weight loss  2. 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 Vascular dementia with behavioral disturbances: without change will continue namenda 10 mg twice daily   4. Depression with anxiety: is emotionally stable; is presently taking ativan 0.5 mg daily to help with anxiety for AM care; ([id number]) zoloft 75 mg daily and will continue to monitor   PREVIOUS   5.  Diabetic foot ulcerations: has some component of pressure as well: without change; is being followed by the wound dr. Albumin 2.6  Will not make changes and will monitor   6. Cardiac arrhythmia: has a history of left arm dvt: heart rate is stable; is on asa 81 mg daily    7. Weight loss: 180 days ago weight was 169 pounds; current  weight 151 pounds: will be placed on fortified food; and house supplement; will  Continue remeron 7.5 mg nightly  8. Psychosis in elderly with behavioral disturbance:  is without change in status; will continue seroquel 25 mg in the AM and 50 mg in the PM.   9. Right leg DVT; she has failed xarelto therapy. Her family had decided to stop this medication; and is presently on asa 81 mg daily   10. Hypertension associated with diabetes: stable  will continue norvasc 10 mg daily   11. Dysphagia, oral phase: stable no signs of aspiration present is on thin liquids with pureed diet will not make changes   12.  GERD without esophagitis: stable will continue prilosec 20 mg daily   13. Hypothyroidism due to acquired atrophy: stable tsh 8.37; will continue synthroid 125 mcg daily   Will discuss with daughter this week; regarding lowering her pill load     MD is aware of resident's narcotic use and is in agreement with current plan of care. We will attempt to wean resident as apropriate   Synthia Innocenteborah Green NP [facility]  Contact [phone] Monday through Friday 8am- 5pm  After hours call [phone]

## 2017-04-19 DIAGNOSIS — R52 Pain, unspecified: Principal | ICD-10-CM

## 2017-04-19 DIAGNOSIS — G8929 Other chronic pain: Secondary | ICD-10-CM | POA: Insufficient documentation

## 2017-04-23 ENCOUNTER — Encounter: Payer: Self-pay | Admitting: Adult Health

## 2017-04-23 ENCOUNTER — Non-Acute Institutional Stay (SKILLED_NURSING_FACILITY): Payer: Medicare Other | Admitting: Adult Health

## 2017-04-23 DIAGNOSIS — E1122 Type 2 diabetes mellitus with diabetic chronic kidney disease: Secondary | ICD-10-CM | POA: Diagnosis not present

## 2017-04-23 DIAGNOSIS — F0151 Vascular dementia with behavioral disturbance: Secondary | ICD-10-CM

## 2017-04-23 DIAGNOSIS — Z794 Long term (current) use of insulin: Secondary | ICD-10-CM

## 2017-04-23 DIAGNOSIS — F0391 Unspecified dementia with behavioral disturbance: Secondary | ICD-10-CM | POA: Diagnosis not present

## 2017-04-23 DIAGNOSIS — E1159 Type 2 diabetes mellitus with other circulatory complications: Secondary | ICD-10-CM

## 2017-04-23 DIAGNOSIS — F03918 Unspecified dementia, unspecified severity, with other behavioral disturbance: Secondary | ICD-10-CM

## 2017-04-23 DIAGNOSIS — I10 Essential (primary) hypertension: Secondary | ICD-10-CM | POA: Diagnosis not present

## 2017-04-23 DIAGNOSIS — F01518 Vascular dementia, unspecified severity, with other behavioral disturbance: Secondary | ICD-10-CM

## 2017-04-23 DIAGNOSIS — N184 Chronic kidney disease, stage 4 (severe): Secondary | ICD-10-CM

## 2017-04-23 NOTE — Progress Notes (Signed)
Location:   [place] Room Number: 132 Place of Service:  SNF (31)   CODE STATUS: dnr (MOST reviewed 12-11-16)  No Known Allergies  Chief Complaint  Patient presents with  . Acute Visit    medication review     HPI:  I have met with her daughter to review her medications. The focus of her care is for comfort. She is taking medications which will not provide her comfort only. We did review all of her medications have decided to stop the following medications: routine tylenol; norvasc; namenda; novolog; zofran; probiotic seroquel. She does understand that her psychoactive medications will need to be weaned off over time. She is in agreement with this plan. She does feel as Hadlea is having uncontrolled pain at times and feel as though she would do better with a more frequent dosing of her roxanol. At this time we have decided to increase her dose to routinely 3 times daily and then have a prn dose available to her.    Past Medical History:  Diagnosis Date  . CKD (chronic kidney disease)   . Dementia   . Diabetes mellitus without complication ([place])   . Hypertension   . Hypothyroid   . Hypothyroid   . Thyroid disease     Past Surgical History:  Procedure Laterality Date  . ABDOMINAL HYSTERECTOMY    . FRACTURE SURGERY    . INCISION AND DRAINAGE ABSCESS Left 10/22/2016   Procedure: INCISION AND DRAINAGE LEFT AXILLARY  ABSCESS;  Surgeon: Excell Seltzer, MD;  Location: [facility];  Service: General;  Laterality: Left;    Social History   Socioeconomic History  . Marital status: Widowed    Spouse name: Not on file  . Number of children: Not on file  . Years of education: Not on file  . Highest education level: Not on file  Social Needs  . Financial resource strain: Not on file  . Food insecurity - worry: Not on file  . Food insecurity - inability: Not on file  . Transportation needs - medical: Not on file  . Transportation needs - non-medical: Not on  file  Occupational History  . Not on file  Tobacco Use  . Smoking status: Never Smoker  . Smokeless tobacco: Never Used  Substance and Sexual Activity  . Alcohol use: No  . Drug use: No  . Sexual activity: Not Currently  Other Topics Concern  . Not on file  Social History Narrative  . Not on file   History reviewed. No pertinent family history.    VITAL SIGNS BP 125/60   Pulse 74   Temp (!) 96.1 F (35.6 C)   Resp 18   Ht 5' 3"  (1.6 m)   Wt 151 lb 4.8 oz (68.6 kg)   SpO2 98%   BMI 26.80 kg/m   Outpatient Encounter Medications as of 04/23/2017  Medication Sig  . acetaminophen (TYLENOL) 500 MG tablet Take 1,000 mg by mouth 3 (three) times daily.   Marland Kitchen amLODipine (NORVASC) 10 MG tablet Take 10 mg by mouth daily.  Marland Kitchen aspirin EC 81 MG tablet Take 81 mg by mouth daily.  . collagenase (SANTYL) ointment Apply topically daily. Apply to wounds on both feet and left ischium  . diclofenac sodium (VOLTAREN) 1 % GEL Apply 2 gram transdermally three times a day for bilateral knee pain  . insulin aspart (NOVOLOG) 100 UNIT/ML injection Inject 10 Units into the skin 3 (three) times daily after meals.   Marland Kitchen  insulin detemir (LEVEMIR) 100 UNIT/ML injection Inject 18 Units into the skin at bedtime.   Marland Kitchen levothyroxine (SYNTHROID, LEVOTHROID) 125 MCG tablet Take 125 mcg by mouth daily.   Marland Kitchen loperamide (IMODIUM A-D) 2 MG tablet Take 2 mg by mouth every 8 (eight) hours as needed for diarrhea or loose stools.  Marland Kitchen LORazepam (ATIVAN) 0.5 MG tablet Take 1 tablet (0.5 mg total) by mouth daily.  . magnesium hydroxide (MILK OF MAGNESIA) 400 MG/5ML suspension Take 30 mLs by mouth daily as needed for mild constipation.   . memantine (NAMENDA) 10 MG tablet Take 10 mg by mouth 2 (two) times daily.  Marland Kitchen morphine (ROXANOL) 20 MG/ML concentrated solution Give 5 mg by mouth two times daily routinely  . Multiple Vitamins-Minerals (DECUBI-VITE) CAPS Take 1 capsule by mouth daily.  . Nutritional Supplements (NUTRITIONAL  SUPPLEMENT PO) Take 120 mLs by mouth daily. *House Supplement*  . omeprazole (PRILOSEC) 20 MG capsule Take 20 mg by mouth daily.  . ondansetron (ZOFRAN-ODT) 8 MG disintegrating tablet Take 8 mg by mouth every 8 (eight) hours as needed for nausea or vomiting.  . Probiotic Product (PROBIOTIC DAILY) CAPS Take 1 capsule by mouth 2 (two) times daily.  . QUEtiapine (SEROQUEL) 50 MG tablet Take 25 mg in the morning and 50 mg at bedtime  . sertraline (ZOLOFT) 50 MG tablet Take 75 mg by mouth daily.   Marland Kitchen UNABLE TO FIND HSG CCD Diet - HSG Puree texture, regular consistency  . vitamin B-12 (CYANOCOBALAMIN) 1000 MCG tablet Take 1,000 mcg by mouth daily.   No facility-administered encounter medications on file as of 04/23/2017.      SIGNIFICANT DIAGNOSTIC EXAMS  PREVIOUS:   02-07-17: chest x-ray: No acute cardiopulmonary findings.  02-13-17: right lower extremity doppler: + dvt  NO NEW EXAMS      LABS REVIEWED: PREVIOUS    04-15-16: wbc 4.9; hgb 12.7; hct 40.1; mcv 92.3; plt 200; glucose 169; bun 23.6; creat 1.21; k+ 4.4; na++ 141; liver normal albumin 3.8; hgb a1c 8.2 04-16-16: glucose 171; bun 19.3; creat 1.35; k+ 4.8; na++ 140; liver normal albumin 3.6; chol 190; ldl 112; trig 109; hdl 56 tsh 15.82; free T4: 0.63; hgb a1c 8.0  05-15-16: tsh 71.63 free T 4: 0.33 06-18-16: tsh 4.63; free T 4: 1.20 free T 3: 1.7 08-20-16: glucose 116; bun 41.9; creat 1.50; k+ 4.4; na++ 145; ca 9.1 tsh 2.06 ast 11; hgb a1c 7.3; chol 142; ldl 63; trig 83; hdl 62; urine micro-albumin 1.6  10-21-16: wbc 6.1; hgb 10.1; hct 31.8; mcv 89.3; plt 296; glucose 245; bun 48; creat 1.5; k+ 5.0 na++ 150; ca 9.4 10-22-16; wbc 5.8; hgb 9.7; hct 30.8; mcv 89.8; plt 280; glucose 205; bun 36; creat 1.32; k+ 4.6; na++ 149; liver normal albumin 3.1 11-26-16: stool for c-diff: +  11-29-16: Wbc 6.3; hgb 9.5; hct 30.0; mcv 94.7; plt 287; glucose 72; bun 39.9; creat 1.56; k+ 4.9; na++ 142; ca 8.8; liver normal albumin 3.4 02-05-17; hgb a1c 11.7; chol  165; ldl 91; trig 171; hdl 39 02-07-17: wbc 11.8; hgb 11.8; hct 38.4; mcv 92.1; plt 293; glucose 335; bun 45; creat 1.57; k+ 4.9; na++ 156; ca 9.0; liver normal albumin 2.6; mag 2.1; hgb a1c 11.4; urine culture: e-coli: keflex 02-09-17: wbc 7.9; hgb 10.2; ht 34.0; mcv 92.6; plt 244; glucose 208; bun 32; creat 1.30; k+ 3.4; na++ 152; ca 8.6 02-10-17: wbc 7.0; hgb 9.9; ht 32.9; mcv 91.4; plt 215; glucose 178; bun 24; creat 1.18; k+ 3.9;  na++ 143; ca 8.1 02-18-17: TSH 8.37   NO NEW LABS    Review of Systems  Unable to perform ROS: Dementia (unable to participate )     Physical Exam  Constitutional:  Frail   Neck: No thyromegaly present.  Cardiovascular: Normal rate, regular rhythm, normal heart sounds and intact distal pulses.  Pulmonary/Chest: Effort normal and breath sounds normal. No respiratory distress.  Abdominal: Soft. Bowel sounds are normal. She exhibits no distension. There is no tenderness.  Musculoskeletal: She exhibits edema.  Left hemiplegia 1+ right lower extremity edema  Has bilateral lower extremity contractures   Lymphadenopathy:    She has no cervical adenopathy.  Neurological:  Is aware   Skin: Skin is warm and dry. She is not diaphoretic.  Unstaged left hip: 4.1x 3.0x x 2.9 cm undermining 7 cm at 7 oclock  stage IV: right lateral ankle 4.2 x 3.2 x 0.7 cm Stage IV: left lateral ankle: 1.8 x 2.2 x 0.1 cm Unstaged right distal medial foot: 1.8 x 1.5 x 0.1 cm Unstaged left medial shin: 7.3 x 1.5 x 0.2 cm DTI: left distal medal foot: 7.3 x 1.5 x 0.2 cm     ASSESSMENT/ PLAN:  TODAY:   1. 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 Vascular dementia with behavioral disturbances:  3. Psychosis in elderly with behavioral disturbance:  4. Hypertension associated with diabetes:    Will stop routine tylenol 1 gm; norvasc 10 mg; probiotic and zofran and novolog  Will lower her namenda to 10 mg daily for one week then stop Will lower her seroquel to 25 mg twice daily  for one week then 12.5 mg twice daily for one week then 12.5 mg nightly for one week then stop Will increase her roxanol to 5 mg every 8 hours and every 4 hours as needed   Time spent with family and patient 40 minutes: discussed medications; expected outcomes from stopping medications; verbalized understanding.    MD is aware of resident's narcotic use and is in agreement with current plan of care. We will attempt to wean resident as apropriate   Ok Edwards NP [facility]  Contact [phone] Monday through Friday 8am- 5pm  After hours call [phone]

## 2017-05-13 ENCOUNTER — Other Ambulatory Visit: Payer: Self-pay

## 2017-05-13 ENCOUNTER — Non-Acute Institutional Stay (SKILLED_NURSING_FACILITY): Payer: Medicare Other | Admitting: Adult Health

## 2017-05-13 ENCOUNTER — Encounter: Payer: Self-pay | Admitting: Adult Health

## 2017-05-13 DIAGNOSIS — F0151 Vascular dementia with behavioral disturbance: Secondary | ICD-10-CM | POA: Diagnosis not present

## 2017-05-13 DIAGNOSIS — L8995 Pressure ulcer of unspecified site, unstageable: Secondary | ICD-10-CM

## 2017-05-13 DIAGNOSIS — R52 Pain, unspecified: Secondary | ICD-10-CM

## 2017-05-13 DIAGNOSIS — G8929 Other chronic pain: Secondary | ICD-10-CM

## 2017-05-13 DIAGNOSIS — F01518 Vascular dementia, unspecified severity, with other behavioral disturbance: Secondary | ICD-10-CM

## 2017-05-13 MED ORDER — MORPHINE SULFATE (CONCENTRATE) 20 MG/ML PO SOLN: ORAL | 0 refills | Status: DC

## 2017-05-13 NOTE — Telephone Encounter (Signed)
RX faxed to [facility] @ [phone], phone number [phone] 

## 2017-05-13 NOTE — Progress Notes (Signed)
Location:   [facility] Room Number: 202 B Place of Service:  SNF (31)   CODE STATUS: dnr (MOST reviewed 12-11-16)  No Known Allergies  Chief Complaint  Patient presents with  . Acute Visit    Pain Management    HPI:  Staff is concerned about her pain management. With any touching and or movement she will moan and grimace with pain. She is unable to participate in the hpi or ros. She continues to be followed by hospice care. There are no reports of fevers present; her appetite remains very poor; and very poor fluid intake.    Past Medical History:  Diagnosis Date  . CKD (chronic kidney disease)   . Dementia   . Diabetes mellitus without complication ([facility])   . Hypertension   . Hypothyroid   . Hypothyroid   . Thyroid disease     Past Surgical History:  Procedure Laterality Date  . ABDOMINAL HYSTERECTOMY    . FRACTURE SURGERY    . INCISION AND DRAINAGE ABSCESS Left 10/22/2016   Procedure: INCISION AND DRAINAGE LEFT AXILLARY  ABSCESS;  Surgeon: Glenna FellowsHoxworth, Benjamin, MD;  Location: [facility];  Service: General;  Laterality: Left;    Social History   Socioeconomic History  . Marital status: Widowed    Spouse name: Not on file  . Number of children: Not on file  . Years of education: Not on file  . Highest education level: Not on file  Social Needs  . Financial resource strain: Not on file  . Food insecurity - worry: Not on file  . Food insecurity - inability: Not on file  . Transportation needs - medical: Not on file  . Transportation needs - non-medical: Not on file  Occupational History  . Not on file  Tobacco Use  . Smoking status: Never Smoker  . Smokeless tobacco: Never Used  Substance and Sexual Activity  . Alcohol use: No  . Drug use: No  . Sexual activity: Not Currently  Other Topics Concern  . Not on file  Social History Narrative  . Not on file   History reviewed. No pertinent family history.    VITAL SIGNS BP 125/60   Pulse 74   Temp  (!) 96.1 F (35.6 C)   Resp 18   Ht 5\' 3"  (1.6 m)   Wt 146 lb (66.2 kg)   HC 98" (248.9 cm)   SpO2 98%   BMI 25.86 kg/m   Outpatient Encounter Medications as of 05/13/2017  Medication Sig  . aspirin EC 81 MG tablet Take 81 mg by mouth daily.  . diclofenac sodium (VOLTAREN) 1 % GEL Apply 2 gram transdermally three times a day for bilateral knee pain  . insulin detemir (LEVEMIR) 100 UNIT/ML injection Inject 18 Units into the skin at bedtime.   Marland Kitchen. levothyroxine (SYNTHROID, LEVOTHROID) 125 MCG tablet Take 125 mcg by mouth daily.   Marland Kitchen. loperamide (IMODIUM A-D) 2 MG tablet Take 2 mg by mouth every 8 (eight) hours as needed for diarrhea or loose stools.  Marland Kitchen. LORazepam (ATIVAN) 0.5 MG tablet Take 1 tablet (0.5 mg total) by mouth daily.  . magnesium hydroxide (MILK OF MAGNESIA) 400 MG/5ML suspension Take 30 mLs by mouth daily as needed for mild constipation.   Marland Kitchen. morphine (ROXANOL) 20 MG/ML concentrated solution Give 5 mg by mouth every 8 hours routinely  . morphine (ROXANOL) 20 MG/ML concentrated solution Give 5 mg by mouth every 4 hours as needed  . Multiple Vitamins-Minerals (DECUBI-VITE) CAPS Take  1 capsule by mouth daily.  . Nutritional Supplements (NUTRITIONAL SUPPLEMENT PO) Take 120 mLs by mouth daily. *House Supplement*  . omeprazole (PRILOSEC) 20 MG capsule Take 20 mg by mouth daily.  . QUEtiapine (SEROQUEL) 25 MG tablet Take 0.5 tablet by mouth at bedtime x 7 days  . senna (SENOKOT) 8.6 MG tablet Take 1 tablet by mouth daily.  . sertraline (ZOLOFT) 50 MG tablet Take 75 mg by mouth daily.   Marland Kitchen UNABLE TO FIND HSG CCD Diet - HSG Puree texture, regular consistency  . vitamin B-12 (CYANOCOBALAMIN) 1000 MCG tablet Take 1,000 mcg by mouth daily.  Marland Kitchen acetaminophen (TYLENOL) 500 MG tablet Take 1,000 mg by mouth 3 (three) times daily.   Marland Kitchen amLODipine (NORVASC) 10 MG tablet Take 10 mg by mouth daily.  . collagenase (SANTYL) ointment Apply topically daily. Apply to wounds on both feet and left ischium  .  insulin aspart (NOVOLOG) 100 UNIT/ML injection Inject 10 Units into the skin 3 (three) times daily after meals.   . [DISCONTINUED] memantine (NAMENDA) 10 MG tablet Take 10 mg by mouth 2 (two) times daily.  . [DISCONTINUED] morphine (ROXANOL) 20 MG/ML concentrated solution Give 5 mg by mouth two times daily routinely (Patient not taking: Reported on 05/13/2017)  . [DISCONTINUED] ondansetron (ZOFRAN-ODT) 8 MG disintegrating tablet Take 8 mg by mouth every 8 (eight) hours as needed for nausea or vomiting.  . [DISCONTINUED] Probiotic Product (PROBIOTIC DAILY) CAPS Take 1 capsule by mouth 2 (two) times daily.   No facility-administered encounter medications on file as of 05/13/2017.      SIGNIFICANT DIAGNOSTIC EXAMS  PREVIOUS:   02-07-17: chest x-ray: No acute cardiopulmonary findings.  02-13-17: right lower extremity doppler: + dvt  NO NEW EXAMS      LABS REVIEWED: PREVIOUS    05-15-16: tsh 71.63 free T 4: 0.33 06-18-16: tsh 4.63; free T 4: 1.20 free T 3: 1.7 08-20-16: glucose 116; bun 41.9; creat 1.50; k+ 4.4; na++ 145; ca 9.1 tsh 2.06 ast 11; hgb a1c 7.3; chol 142; ldl 63; trig 83; hdl 62; urine micro-albumin 1.6  10-21-16: wbc 6.1; hgb 10.1; hct 31.8; mcv 89.3; plt 296; glucose 245; bun 48; creat 1.5; k+ 5.0 na++ 150; ca 9.4 10-22-16; wbc 5.8; hgb 9.7; hct 30.8; mcv 89.8; plt 280; glucose 205; bun 36; creat 1.32; k+ 4.6; na++ 149; liver normal albumin 3.1 11-26-16: stool for c-diff: +  11-29-16: Wbc 6.3; hgb 9.5; hct 30.0; mcv 94.7; plt 287; glucose 72; bun 39.9; creat 1.56; k+ 4.9; na++ 142; ca 8.8; liver normal albumin 3.4 02-05-17; hgb a1c 11.7; chol 165; ldl 91; trig 171; hdl 39 02-07-17: wbc 11.8; hgb 11.8; hct 38.4; mcv 92.1; plt 293; glucose 335; bun 45; creat 1.57; k+ 4.9; na++ 156; ca 9.0; liver normal albumin 2.6; mag 2.1; hgb a1c 11.4; urine culture: e-coli: keflex 02-09-17: wbc 7.9; hgb 10.2; ht 34.0; mcv 92.6; plt 244; glucose 208; bun 32; creat 1.30; k+ 3.4; na++ 152; ca 8.6 02-10-17:  wbc 7.0; hgb 9.9; ht 32.9; mcv 91.4; plt 215; glucose 178; bun 24; creat 1.18; k+ 3.9; na++ 143; ca 8.1 02-18-17: TSH 8.37   NO NEW LABS    Review of Systems  Unable to perform ROS: Dementia (unable to participate )   Physical Exam  Constitutional: No distress.  Cachexia   Neck: No thyromegaly present.  Cardiovascular: Normal rate, regular rhythm, normal heart sounds and intact distal pulses.  Pulmonary/Chest: Effort normal and breath sounds normal. No respiratory distress.  Abdominal: Soft. Bowel sounds are normal. She exhibits no distension.  Musculoskeletal: She exhibits no edema.  Left hemiplegia Lower extremity contractures    Lymphadenopathy:    She has no cervical adenopathy.  Neurological:  Is not aware   Skin: Skin is warm and dry. She is not diaphoretic.  Pressure areas continue to decline     ASSESSMENT/ PLAN:  TODAY:   1. Dementia with behavioral disturbance, vascular: 2. Pressure injury of skin 3. Chronic generalized pain   Will change roxanol to 10 mg every 4 hours routinely and every 2 hours as needed and will continue to monitor her status.   MD is aware of resident's narcotic use and is in agreement with current plan of care. We will attempt to wean resident as apropriate    Synthia Innocent NP [facility]  Contact [phone] Monday through Friday 8am- 5pm  After hours call [phone]

## 2017-05-15 ENCOUNTER — Non-Acute Institutional Stay (SKILLED_NURSING_FACILITY): Payer: Medicare Other | Admitting: Adult Health

## 2017-05-15 ENCOUNTER — Encounter: Payer: Self-pay | Admitting: Adult Health

## 2017-05-15 ENCOUNTER — Other Ambulatory Visit: Payer: Self-pay

## 2017-05-15 DIAGNOSIS — R627 Adult failure to thrive: Secondary | ICD-10-CM | POA: Diagnosis not present

## 2017-05-15 DIAGNOSIS — E034 Atrophy of thyroid (acquired): Secondary | ICD-10-CM

## 2017-05-15 DIAGNOSIS — G8929 Other chronic pain: Secondary | ICD-10-CM | POA: Diagnosis not present

## 2017-05-15 DIAGNOSIS — F418 Other specified anxiety disorders: Secondary | ICD-10-CM

## 2017-05-15 DIAGNOSIS — R634 Abnormal weight loss: Secondary | ICD-10-CM | POA: Diagnosis not present

## 2017-05-15 DIAGNOSIS — R52 Pain, unspecified: Secondary | ICD-10-CM

## 2017-05-15 MED ORDER — MORPHINE SULFATE (CONCENTRATE) 20 MG/ML PO SOLN: ORAL | 0 refills | Status: AC

## 2017-05-15 NOTE — Progress Notes (Signed)
Location:   [facility] Room Number: 202 B Place of Service:  SNF (31)   CODE STATUS: DNR (Most form updated 12/11/16) COMFORT CARE ONLY   No Known Allergies  Chief Complaint  Patient presents with  . Medical Management of Chronic Issues    Hypothyroidism; depression with anxiety; failure to thrive; weight loss; pain     HPI:  She is an 84 year old long term resident of this facility being for the management of his chronic illnesses hypothyroidism; depression with anxiety; failure to thrive; weight loss; pain . She continues to be followed by hospice care. The focus of her care is for comfort only. She is unable to participate in the hpi or ros. She does continue to have pain; which is not being adequately managed. There are no reports of fevers; no reports of behavioral issues.   Past Medical History:  Diagnosis Date  . CKD (chronic kidney disease)   . Dementia   . Diabetes mellitus without complication (HCC)   . Hypertension   . Hypothyroid   . Hypothyroid   . Thyroid disease     Past Surgical History:  Procedure Laterality Date  . ABDOMINAL HYSTERECTOMY    . FRACTURE SURGERY    . INCISION AND DRAINAGE ABSCESS Left 10/22/2016   Procedure: INCISION AND DRAINAGE LEFT AXILLARY  ABSCESS;  Surgeon: Glenna Fellows, MD;  Location: [facility];  Service: General;  Laterality: Left;    Social History   Socioeconomic History  . Marital status: Widowed    Spouse name: Not on file  . Number of children: Not on file  . Years of education: Not on file  . Highest education level: Not on file  Social Needs  . Financial resource strain: Not on file  . Food insecurity - worry: Not on file  . Food insecurity - inability: Not on file  . Transportation needs - medical: Not on file  . Transportation needs - non-medical: Not on file  Occupational History  . Not on file  Tobacco Use  . Smoking status: Never Smoker  . Smokeless tobacco: Never Used  Substance and  Sexual Activity  . Alcohol use: No  . Drug use: No  . Sexual activity: Not Currently  Other Topics Concern  . Not on file  Social History Narrative  . Not on file   History reviewed. No pertinent family history.    VITAL SIGNS BP 125/60   Pulse 74   Temp (!) 96.1 F (35.6 C)   Resp 18   Ht 5\' 3"  (1.6 m)   Wt 146 lb (66.2 kg)   SpO2 98%   BMI 25.86 kg/m   Outpatient Encounter Medications as of 05/15/2017  Medication Sig  . aspirin EC 81 MG tablet Take 81 mg by mouth daily.  . diclofenac sodium (VOLTAREN) 1 % GEL Apply 2 gram transdermally three times a day for bilateral knee pain  . insulin detemir (LEVEMIR) 100 UNIT/ML injection Inject 18 Units into the skin at bedtime.   Marland Kitchen levothyroxine (SYNTHROID, LEVOTHROID) 125 MCG tablet Take 125 mcg by mouth daily.   Marland Kitchen loperamide (IMODIUM A-D) 2 MG tablet Take 2 mg by mouth every 8 (eight) hours as needed for diarrhea or loose stools.  Marland Kitchen LORazepam (ATIVAN) 0.5 MG tablet Take 1 tablet (0.5 mg total) by mouth daily.  . magnesium hydroxide (MILK OF MAGNESIA) 400 MG/5ML suspension Take 30 mLs by mouth daily as needed for mild constipation.   Marland Kitchen morphine (ROXANOL) 20  MG/ML concentrated solution Give 10mg  by mouth every 4 hours routinely  . morphine (ROXANOL) 20 MG/ML concentrated solution Give 10 mg by mouth every 2 (two) hours as needed  . Multiple Vitamins-Minerals (DECUBI-VITE) CAPS Take 1 capsule by mouth daily.  . Nutritional Supplements (NUTRITIONAL SUPPLEMENT PO) Take 120 mLs by mouth daily. *House Supplement*  . omeprazole (PRILOSEC) 20 MG capsule Take 20 mg by mouth daily.  Marland Kitchen senna (SENOKOT) 8.6 MG tablet Take 1 tablet by mouth daily.  . sertraline (ZOLOFT) 50 MG tablet Take 75 mg by mouth daily.   Marland Kitchen UNABLE TO FIND HSG CCD Diet - HSG Puree texture, regular consistency  . vitamin B-12 (CYANOCOBALAMIN) 1000 MCG tablet Take 1,000 mcg by mouth daily.  . [DISCONTINUED] acetaminophen (TYLENOL) 500 MG tablet Take 1,000 mg by mouth 3 (three)  times daily.   . [DISCONTINUED] amLODipine (NORVASC) 10 MG tablet Take 10 mg by mouth daily.  . [DISCONTINUED] collagenase (SANTYL) ointment Apply topically daily. Apply to wounds on both feet and left ischium (Patient not taking: Reported on 05/15/2017)  . [DISCONTINUED] insulin aspart (NOVOLOG) 100 UNIT/ML injection Inject 10 Units into the skin 3 (three) times daily after meals.   . [DISCONTINUED] QUEtiapine (SEROQUEL) 25 MG tablet Take 0.5 tablet by mouth at bedtime x 7 days   No facility-administered encounter medications on file as of 05/15/2017.      SIGNIFICANT DIAGNOSTIC EXAMS  PREVIOUS:   02-07-17: chest x-ray: No acute cardiopulmonary findings.  02-13-17: right lower extremity doppler: + dvt  NO NEW EXAMS      LABS REVIEWED: PREVIOUS    05-15-16: tsh 71.63 free T 4: 0.33 06-18-16: tsh 4.63; free T 4: 1.20 free T 3: 1.7 08-20-16: glucose 116; bun 41.9; creat 1.50; k+ 4.4; na++ 145; ca 9.1 tsh 2.06 ast 11; hgb a1c 7.3; chol 142; ldl 63; trig 83; hdl 62; urine micro-albumin 1.6  10-21-16: wbc 6.1; hgb 10.1; hct 31.8; mcv 89.3; plt 296; glucose 245; bun 48; creat 1.5; k+ 5.0 na++ 150; ca 9.4 10-22-16; wbc 5.8; hgb 9.7; hct 30.8; mcv 89.8; plt 280; glucose 205; bun 36; creat 1.32; k+ 4.6; na++ 149; liver normal albumin 3.1 11-26-16: stool for c-diff: +  11-29-16: Wbc 6.3; hgb 9.5; hct 30.0; mcv 94.7; plt 287; glucose 72; bun 39.9; creat 1.56; k+ 4.9; na++ 142; ca 8.8; liver normal albumin 3.4 02-05-17; hgb a1c 11.7; chol 165; ldl 91; trig 171; hdl 39 02-07-17: wbc 11.8; hgb 11.8; hct 38.4; mcv 92.1; plt 293; glucose 335; bun 45; creat 1.57; k+ 4.9; na++ 156; ca 9.0; liver normal albumin 2.6; mag 2.1; hgb a1c 11.4; urine culture: e-coli: keflex 02-09-17: wbc 7.9; hgb 10.2; ht 34.0; mcv 92.6; plt 244; glucose 208; bun 32; creat 1.30; k+ 3.4; na++ 152; ca 8.6 02-10-17: wbc 7.0; hgb 9.9; ht 32.9; mcv 91.4; plt 215; glucose 178; bun 24; creat 1.18; k+ 3.9; na++ 143; ca 8.1 02-18-17: TSH 8.37    NO NEW LABS    Review of Systems  Unable to perform ROS: Dementia (unable to participate )    Physical Exam  Constitutional: No distress.  Cachexia   Neck: No thyromegaly present.  Cardiovascular: Normal rate, regular rhythm, normal heart sounds and intact distal pulses.  Pulmonary/Chest: Effort normal and breath sounds normal. No respiratory distress.  Abdominal: Soft. Bowel sounds are normal. She exhibits no distension. There is no tenderness.  Musculoskeletal: She exhibits no edema.  Left hemiplegia Bilateral lower extremity contractures   Lymphadenopathy:  She has no cervical adenopathy.  Neurological:  Is somewhat aware   Skin: Skin is warm and dry. She is not diaphoretic.  Multiple pressure ulcerations; continue to decline     ASSESSMENT/ PLAN:  TODAY:   1.Chronic generalized pain: her pain is not being managed; will change roxanol to 15 mg every 4 hours routinely and 10 mg every 2 hours as needed will monitor    2.Hypothyroidism due to acquired atrophy: stable tsh 8.37; will stop synthroid at this time will monitor   3.Weight loss and failure to thrive in adult: continues to lose weight. Her weight in Oct 2018 was 164 pounds; current  weight 146 pounds: is on supplements per facility protocol     4. Depression with anxiety: is emotionally stable; is presently taking ativan 0.5 mg daily to help with anxiety for AM care; will stop zoloft at this time; doubtful that this medication is providing any benefit  PREVIOUS   5. Has multiple ulcerations and wounds: without change; is being followed by the wound dr. Albumin 2.6  Will not make changes and will monitor  No further antibiotics no further sharp debridement   6. Cardiac arrhythmia: has a history of left arm dvt: heart rate is stable; is on asa 81 mg daily    7.. Psychosis in elderly with behavioral disturbance: her seroquel has been stopped; more than likely she was not receiving benefit from this medication.    8. Right leg DVT; she has failed xarelto therapy. Her family had decided to stop this medication; and is presently on asa 81 mg daily   9.  GERD without esophagitis: stable will continue prilosec 20 mg daily   10. 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   11. Vascular dementia with behavioral disturbances: she continues to decline; has weight loss; is currently not on medications; will monitor    MD is aware of resident's narcotic use and is in agreement with current plan of care. We will attempt to wean resident as apropriate    Cynthia Innocenteborah Green NP [facility]  Contact [phone] Monday through Friday 8am- 5pm  After hours call [phone]

## 2017-05-15 NOTE — Telephone Encounter (Signed)
RX faxed to [facility] @ [phone], phone number [phone] 

## 2017-06-09 DEATH — deceased

## 2017-10-07 IMAGING — CR DG CHEST 2V
2 series · 2 of 2 positions shown · non-contrast
Comparison: 05/25/2015

CLINICAL DATA: Recent fall

EXAM:
CHEST  2 VIEW

[chest lat]
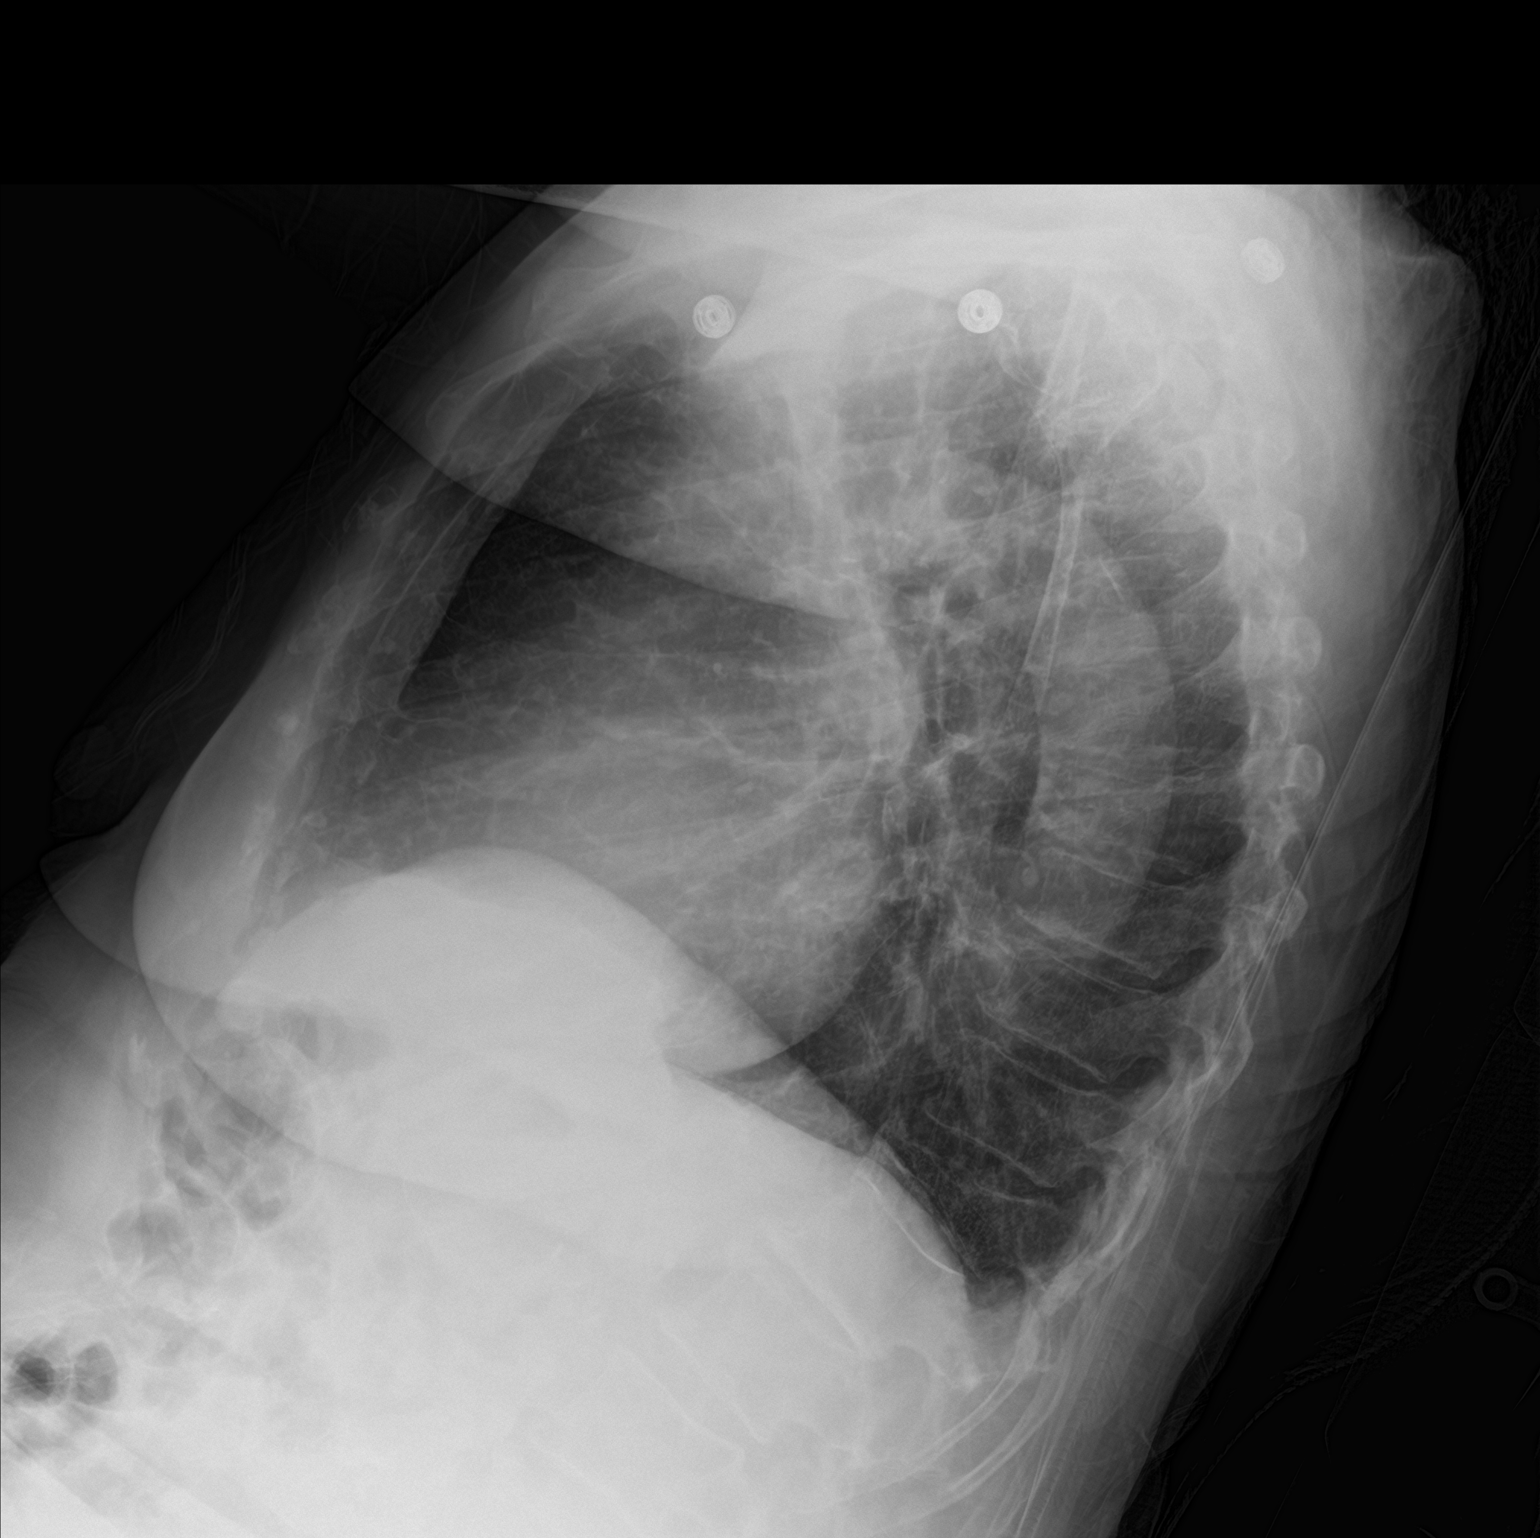

[chest ap]
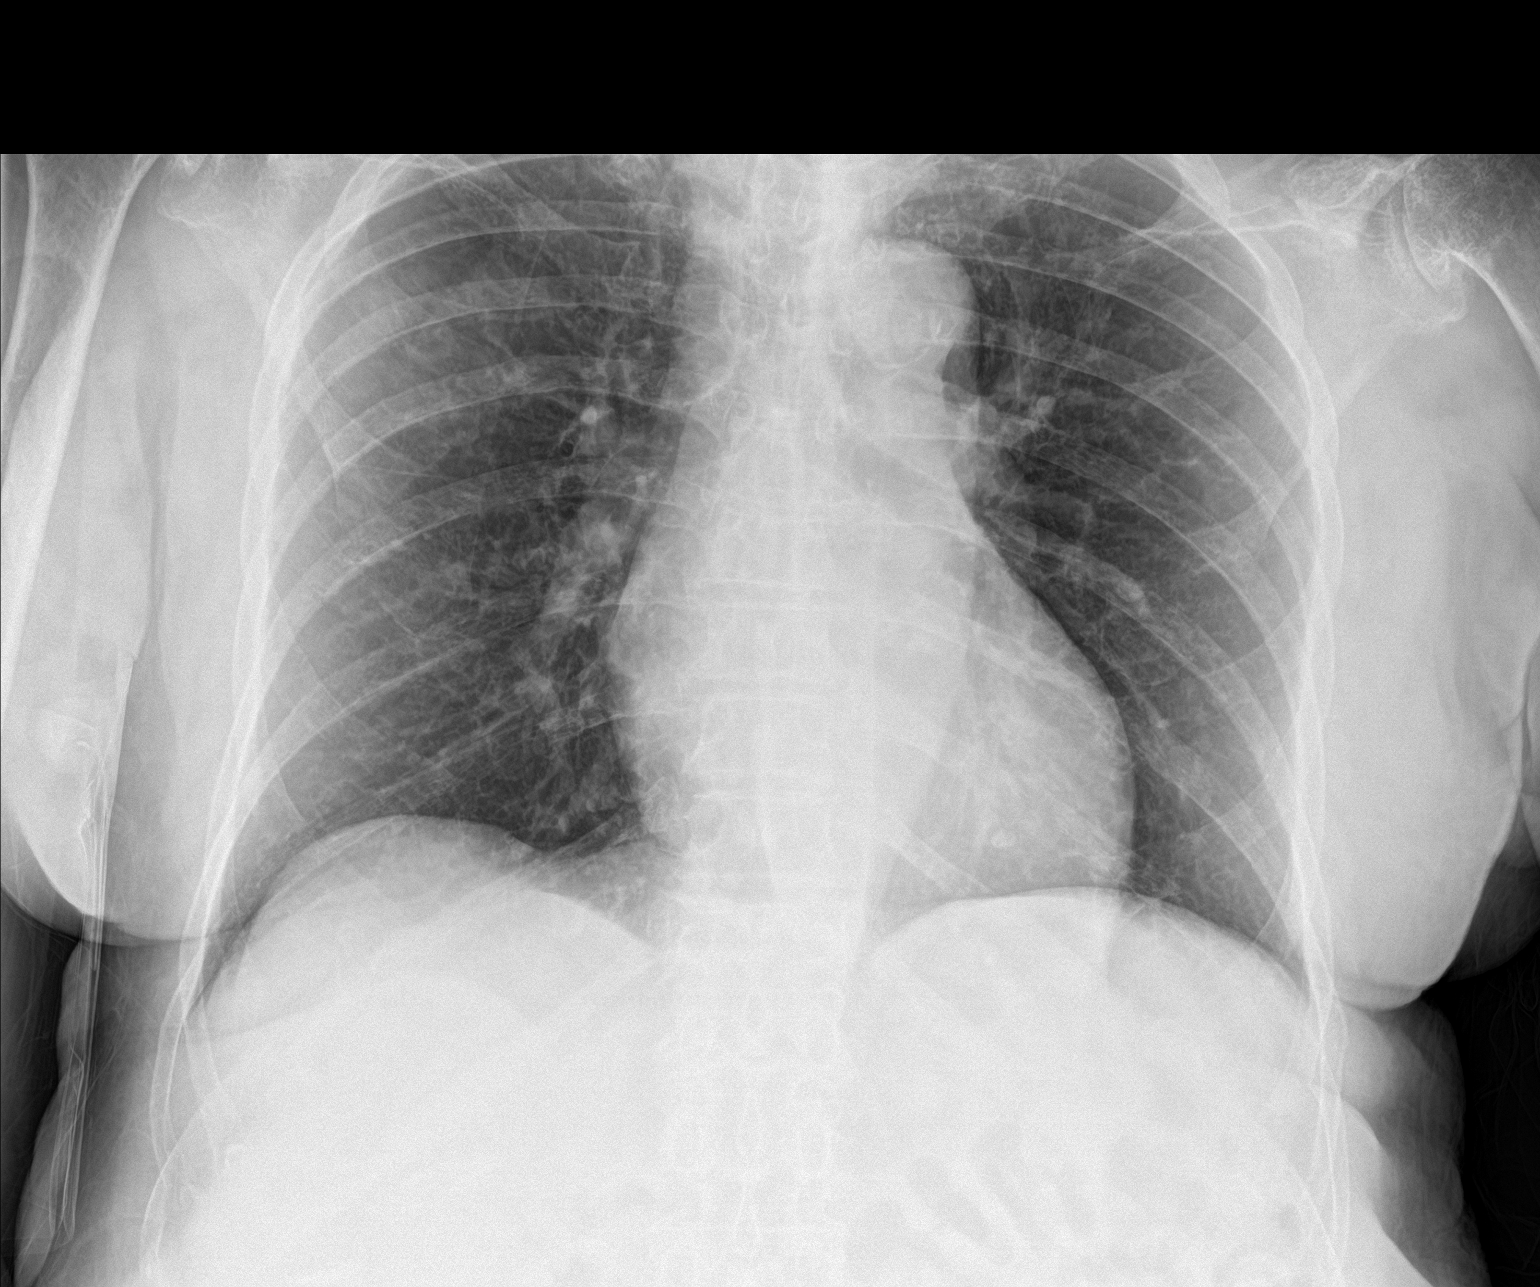

[2 of 2 positions shown; findings below may reference images not displayed]

FINDINGS: The heart size and mediastinal contours are within normal limits.
Both lungs are clear. The visualized skeletal structures show
chronic compression deformities in the lower thoracic spine.
IMPRESSION: No active cardiopulmonary disease.

## 2019-06-18 IMAGING — CR DG CHEST 2V
2 series · 2 of 2 positions shown · non-contrast
Comparison: 03/01/2016

CLINICAL DATA: Lethargic and week.

EXAM:
CHEST  2 VIEW

[w chest lat]
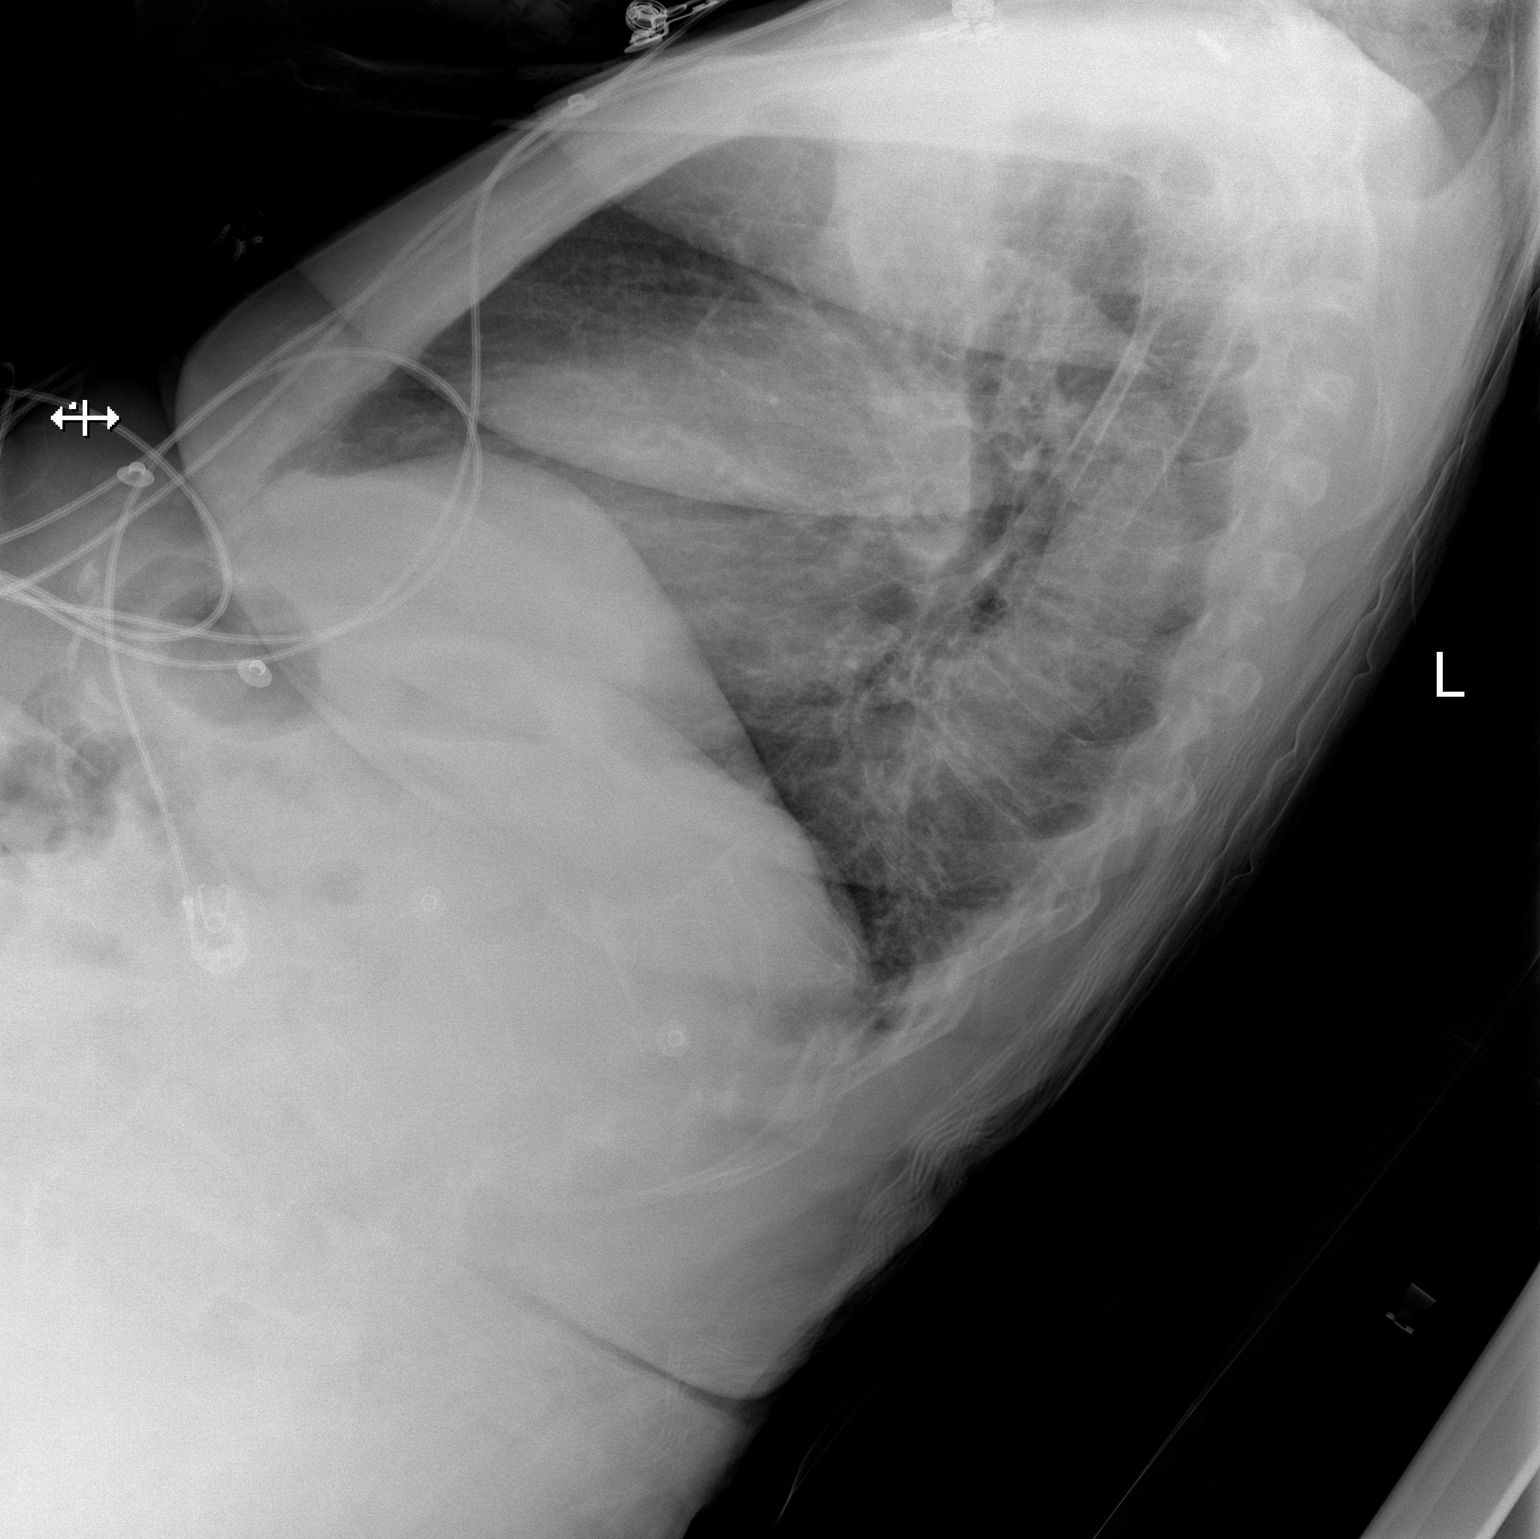

[x chest ap]
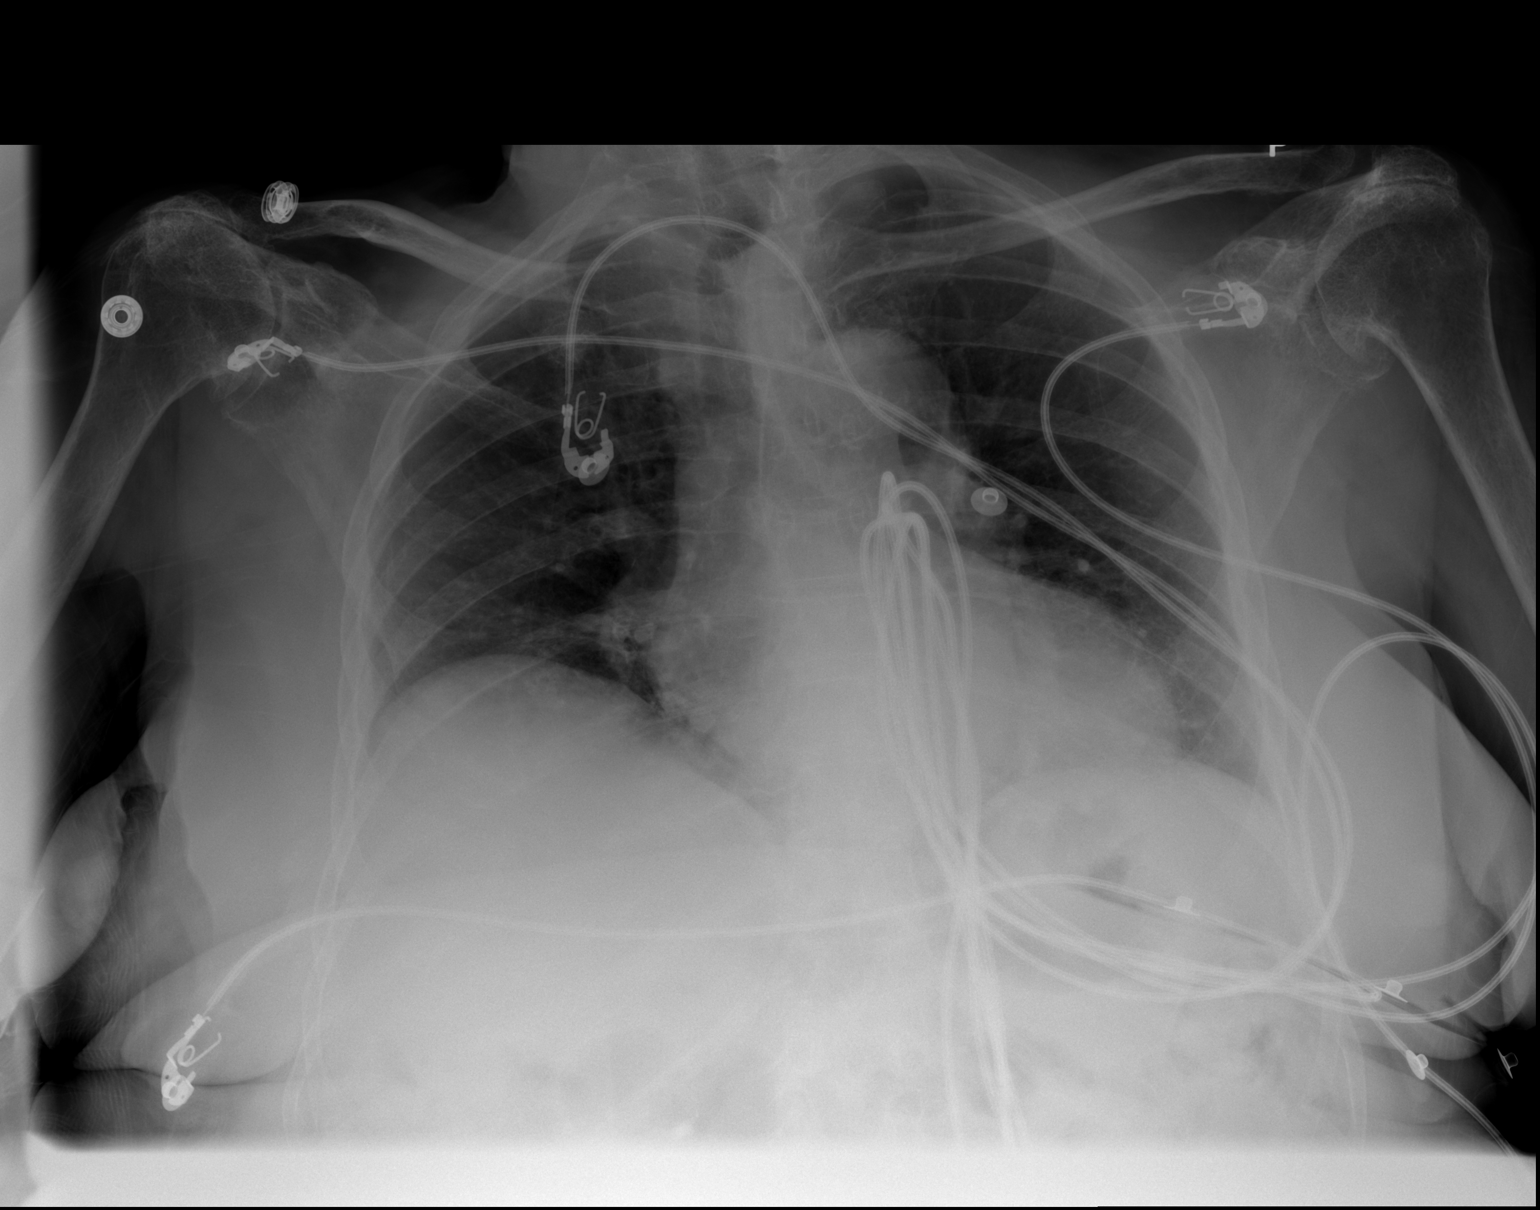

[2 of 2 positions shown; findings below may reference images not displayed]

FINDINGS: Knee heart is mildly enlarged but stable. There is moderate
tortuosity of the thoracic aorta. The pulmonary hila appear normal.
Low lung volumes with vascular crowding and streaky basilar
atelectasis but no infiltrates, edema or effusions. The bony thorax
is intact. Remote T12 compression fracture is noted. Advanced
degenerative changes involving both shoulders and probable bilateral
rotator cuff disease.
IMPRESSION: No acute cardiopulmonary findings.
